# Patient Record
Sex: Female | Born: 1995 | Race: Black or African American | Hispanic: No | Marital: Single | Smoking: Never smoker
Health system: Southern US, Community
[De-identification: ages and names within clinical notes are randomized; demographics above are authoritative.]

## PROBLEM LIST (undated history)

## (undated) ENCOUNTER — Inpatient Hospital Stay (HOSPITAL_COMMUNITY): Payer: Self-pay

## (undated) ENCOUNTER — Emergency Department (HOSPITAL_COMMUNITY): Admission: EM | Payer: Medicaid Other | Source: Home / Self Care

## (undated) DIAGNOSIS — O009 Unspecified ectopic pregnancy without intrauterine pregnancy: Secondary | ICD-10-CM

## (undated) DIAGNOSIS — R87629 Unspecified abnormal cytological findings in specimens from vagina: Secondary | ICD-10-CM

## (undated) DIAGNOSIS — S37009A Unspecified injury of unspecified kidney, initial encounter: Secondary | ICD-10-CM

## (undated) DIAGNOSIS — G43109 Migraine with aura, not intractable, without status migrainosus: Secondary | ICD-10-CM

## (undated) DIAGNOSIS — S36119A Unspecified injury of liver, initial encounter: Secondary | ICD-10-CM

## (undated) DIAGNOSIS — S36209A Unspecified injury of unspecified part of pancreas, initial encounter: Secondary | ICD-10-CM

## (undated) DIAGNOSIS — W3400XA Accidental discharge from unspecified firearms or gun, initial encounter: Secondary | ICD-10-CM

## (undated) DIAGNOSIS — A749 Chlamydial infection, unspecified: Secondary | ICD-10-CM

## (undated) DIAGNOSIS — F329 Major depressive disorder, single episode, unspecified: Secondary | ICD-10-CM

## (undated) DIAGNOSIS — F32A Depression, unspecified: Secondary | ICD-10-CM

## (undated) DIAGNOSIS — K651 Peritoneal abscess: Secondary | ICD-10-CM

## (undated) DIAGNOSIS — S3630XA Unspecified injury of stomach, initial encounter: Secondary | ICD-10-CM

## (undated) HISTORY — DX: Unspecified abnormal cytological findings in specimens from vagina: R87.629

## (undated) HISTORY — DX: Migraine with aura, not intractable, without status migrainosus: G43.109

---
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
## 2013-01-11 ENCOUNTER — Telehealth: Payer: Self-pay | Admitting: Obstetrics and Gynecology

## 2013-01-11 NOTE — Telephone Encounter (Signed)
Spoke with pt. Period started today. Blood is "orange" instead of red. +cramping. Appt scheduled to be seen. JSY

## 2013-01-16 ENCOUNTER — Encounter: Payer: Self-pay | Admitting: *Deleted

## 2013-01-16 DIAGNOSIS — G43109 Migraine with aura, not intractable, without status migrainosus: Secondary | ICD-10-CM

## 2013-01-17 ENCOUNTER — Ambulatory Visit: Payer: Self-pay | Admitting: Adult Health

## 2013-01-23 ENCOUNTER — Ambulatory Visit: Payer: Self-pay | Admitting: Adult Health

## 2013-01-31 ENCOUNTER — Encounter: Payer: Self-pay | Admitting: Adult Health

## 2013-02-13 ENCOUNTER — Other Ambulatory Visit: Payer: Self-pay

## 2013-02-13 ENCOUNTER — Encounter: Payer: Self-pay | Admitting: Adult Health

## 2013-02-14 ENCOUNTER — Encounter: Payer: Self-pay | Admitting: Advanced Practice Midwife

## 2013-02-14 ENCOUNTER — Other Ambulatory Visit: Payer: Self-pay

## 2013-02-22 ENCOUNTER — Ambulatory Visit (INDEPENDENT_AMBULATORY_CARE_PROVIDER_SITE_OTHER): Payer: Medicaid Other | Admitting: Women's Health

## 2013-02-22 ENCOUNTER — Encounter: Payer: Self-pay | Admitting: Women's Health

## 2013-02-22 ENCOUNTER — Other Ambulatory Visit: Payer: Medicaid Other

## 2013-02-22 VITALS — BP 100/62 | Ht 66.0 in | Wt 140.5 lb

## 2013-02-22 DIAGNOSIS — Z3009 Encounter for other general counseling and advice on contraception: Secondary | ICD-10-CM

## 2013-02-22 DIAGNOSIS — A499 Bacterial infection, unspecified: Secondary | ICD-10-CM

## 2013-02-22 DIAGNOSIS — N898 Other specified noninflammatory disorders of vagina: Secondary | ICD-10-CM

## 2013-02-22 DIAGNOSIS — N760 Acute vaginitis: Secondary | ICD-10-CM

## 2013-02-22 DIAGNOSIS — Z3049 Encounter for surveillance of other contraceptives: Secondary | ICD-10-CM

## 2013-02-22 DIAGNOSIS — Z309 Encounter for contraceptive management, unspecified: Secondary | ICD-10-CM

## 2013-02-22 DIAGNOSIS — B9689 Other specified bacterial agents as the cause of diseases classified elsewhere: Secondary | ICD-10-CM

## 2013-02-22 DIAGNOSIS — Z3202 Encounter for pregnancy test, result negative: Secondary | ICD-10-CM

## 2013-02-22 LAB — POCT WET PREP (WET MOUNT)

## 2013-02-22 MED ORDER — METRONIDAZOLE 500 MG PO TABS: 500.0000 mg | ORAL_TABLET | Freq: Two times a day (BID) | ORAL | Status: DC

## 2013-02-22 MED ORDER — MEDROXYPROGESTERONE ACETATE 150 MG/ML IM SUSP
150.0000 mg | Freq: Once | INTRAMUSCULAR | Status: AC
  Administered 2013-02-22: 150 mg via INTRAMUSCULAR

## 2013-02-22 NOTE — Patient Instructions (Signed)
Bacterial Vaginosis Bacterial vaginosis (BV) is a vaginal infection where the normal balance of bacteria in the vagina is disrupted. The normal balance is then replaced by an overgrowth of certain bacteria. There are several different kinds of bacteria that can cause BV. BV is the most common vaginal infection in women of childbearing age. CAUSES   The cause of BV is not fully understood. BV develops when there is an increase or imbalance of harmful bacteria.  Some activities or behaviors can upset the normal balance of bacteria in the vagina and put women at increased risk including:  Having a new sex partner or multiple sex partners.  Douching.  Using an intrauterine device (IUD) for contraception.  It is not clear what role sexual activity plays in the development of BV. However, women that have never had sexual intercourse are rarely infected with BV. Women do not get BV from toilet seats, bedding, swimming pools or from touching objects around them.  SYMPTOMS   Grey vaginal discharge.  A fish-like odor with discharge, especially after sexual intercourse.  Itching or burning of the vagina and vulva.  Burning or pain with urination.  Some women have no signs or symptoms at all. DIAGNOSIS  Your caregiver must examine the vagina for signs of BV. Your caregiver will perform lab tests and look at the sample of vaginal fluid through a microscope. They will look for bacteria and abnormal cells (clue cells), a pH test higher than 4.5, and a positive amine test all associated with BV.  RISKS AND COMPLICATIONS   Pelvic inflammatory disease (PID).  Infections following gynecology surgery.  Developing HIV.  Developing herpes virus. TREATMENT  Sometimes BV will clear up without treatment. However, all women with symptoms of BV should be treated to avoid complications, especially if gynecology surgery is planned. Female partners generally do not need to be treated. However, BV may spread  between female sex partners so treatment is helpful in preventing a recurrence of BV.   BV may be treated with antibiotics. The antibiotics come in either pill or vaginal cream forms. Either can be used with nonpregnant or pregnant women, but the recommended dosages differ. These antibiotics are not harmful to the baby.  BV can recur after treatment. If this happens, a second round of antibiotics will often be prescribed.  Treatment is important for pregnant women. If not treated, BV can cause a premature delivery, especially for a pregnant woman who had a premature birth in the past. All pregnant women who have symptoms of BV should be checked and treated.  For chronic reoccurrence of BV, treatment with a type of prescribed gel vaginally twice a week is helpful. HOME CARE INSTRUCTIONS   Finish all medication as directed by your caregiver.  Do not have sex until treatment is completed.  Tell your sexual partner that you have a vaginal infection. They should see their caregiver and be treated if they have problems, such as a mild rash or itching.  Practice safe sex. Use condoms. Only have 1 sex partner. PREVENTION  Basic prevention steps can help reduce the risk of upsetting the natural balance of bacteria in the vagina and developing BV:  Do not have sexual intercourse (be abstinent).  Do not douche.  Use all of the medicine prescribed for treatment of BV, even if the signs and symptoms go away.  Tell your sex partner if you have BV. That way, they can be treated, if needed, to prevent reoccurrence. SEEK MEDICAL CARE IF:     Your symptoms are not improving after 3 days of treatment.  You have increased discharge, pain, or fever. MAKE SURE YOU:   Understand these instructions.  Will watch your condition.  Will get help right away if you are not doing well or get worse. FOR MORE INFORMATION  Division of STD Prevention (DSTDP), Centers for Disease Control and Prevention:  SolutionApps.co.za American Social Health Association (ASHA): www.ashastd.org  Document Released: 07/13/2005 Document Revised: 10/05/2011 Document Reviewed: 01/03/2009 [facility] Patient Information 2014 [place], [place].  Medroxyprogesterone injection [Contraceptive] What is this medicine? MEDROXYPROGESTERONE (me DROX ee proe JES te rone) contraceptive injections prevent pregnancy. They provide effective birth control for 3 months. Depo-subQ Provera 104 is also used for treating pain related to endometriosis. This medicine may be used for other purposes; ask your health care provider or pharmacist if you have questions. What should I tell my health care provider before I take this medicine? They need to know if you have any of these conditions: -frequently drink alcohol -asthma -blood vessel disease or a history of a blood clot in the lungs or legs -bone disease such as osteoporosis -breast cancer -diabetes -eating disorder (anorexia nervosa or bulimia) -high blood pressure -HIV infection or AIDS -kidney disease -liver disease -mental depression -migraine -seizures (convulsions) -stroke -tobacco smoker -vaginal bleeding -an unusual or allergic reaction to medroxyprogesterone, other hormones, medicines, foods, dyes, or preservatives -pregnant or trying to get pregnant -breast-feeding How should I use this medicine? Depo-Provera Contraceptive injection is given into a muscle. Depo-subQ Provera 104 injection is given under the skin. These injections are given by a health care professional. You must not be pregnant before getting an injection. The injection is usually given during the first 5 days after the start of a menstrual period or 6 weeks after delivery of a baby. Talk to your pediatrician regarding the use of this medicine in children. Special care may be needed. These injections have been used in female children who have started having menstrual periods. Overdosage: If you think you  have taken too much of this medicine contact a poison control center or emergency room at once. NOTE: This medicine is only for you. Do not share this medicine with others. What if I miss a dose? Try not to miss a dose. You must get an injection once every 3 months to maintain birth control. If you cannot keep an appointment, call and reschedule it. If you wait longer than 13 weeks between Depo-Provera contraceptive injections or longer than 14 weeks between Depo-subQ Provera 104 injections, you could get pregnant. Use another method for birth control if you miss your appointment. You may also need a pregnancy test before receiving another injection. What may interact with this medicine? Do not take this medicine with any of the following medications: -bosentan This medicine may also interact with the following medications: -aminoglutethimide -antibiotics or medicines for infections, especially rifampin, rifabutin, rifapentine, and griseofulvin -aprepitant -barbiturate medicines such as phenobarbital or primidone -bexarotene -carbamazepine -medicines for seizures like ethotoin, felbamate, oxcarbazepine, phenytoin, topiramate -modafinil -St. John's wort This list may not describe all possible interactions. Give your health care provider a list of all the medicines, herbs, non-prescription drugs, or dietary supplements you use. Also tell them if you smoke, drink alcohol, or use illegal drugs. Some items may interact with your medicine. What should I watch for while using this medicine? This drug does not protect you against HIV infection (AIDS) or other sexually transmitted diseases. Use of this product may cause you to lose  calcium from your bones. Loss of calcium may cause weak bones (osteoporosis). Only use this product for more than 2 years if other forms of birth control are not right for you. The longer you use this product for birth control the more likely you will be at risk for weak bones.  Ask your health care professional how you can keep strong bones. You may have a change in bleeding pattern or irregular periods. Many females stop having periods while taking this drug. If you have received your injections on time, your chance of being pregnant is very low. If you think you may be pregnant, see your health care professional as soon as possible. Tell your health care professional if you want to get pregnant within the next year. The effect of this medicine may last a long time after you get your last injection. What side effects may I notice from receiving this medicine? Side effects that you should report to your doctor or health care professional as soon as possible: -allergic reactions like skin rash, itching or hives, swelling of the face, lips, or tongue -breast tenderness or discharge -breathing problems -changes in vision -depression -feeling faint or lightheaded, falls -fever -pain in the abdomen, chest, groin, or leg -problems with balance, talking, walking -unusually weak or tired -yellowing of the eyes or skin Side effects that usually do not require medical attention (report to your doctor or health care professional if they continue or are bothersome): -acne -fluid retention and swelling -headache -irregular periods, spotting, or absent periods -temporary pain, itching, or skin reaction at site where injected -weight gain This list may not describe all possible side effects. Call your doctor for medical advice about side effects. You may report side effects to FDA at 1-800-FDA-1088. Where should I keep my medicine? This does not apply. The injection will be given to you by a health care professional. NOTE: This sheet is a summary. It may not cover all possible information. If you have questions about this medicine, talk to your doctor, pharmacist, or health care provider.  2013, Elsevier/Gold Standard. (08/03/2008 6:37:56 PM)

## 2013-02-22 NOTE — Progress Notes (Signed)
Ruth Dunlap is a 17 y.o. G58P1001 female who is here today for nexplanon insertion. She had a nexplanon placed in Nov 2013, and had it removed in Feb 2014 b/c she thought it was causing her headaches, but she has continued to have headaches app 2x/wk since removal.  Headaches are unilateral, w/o aura, n/v, photo or phonosensitivity, and r/b tylenol.  Denies h/o HTN or DVTs.  Discussed w/ JVF, states ok to re-place nexplanon. Hasn't had sex in ~ 1 month.  Reports malodorous d/c x few weeks.   Risks/benefits/side effects of Nexplanon have been discussed and her questions have been answered.  Specifically, a failure rate of 07/998 has been reported, with an increased failure rate if pt takes St. John's Wort and/or antiseizure medicaitons.  Alondra L Neira is aware of the common side effect of irregular bleeding, which the incidence of decreases over time.  O: BP 100/62  Ht 5\' 6"  (1.676 m)  Wt 140 lb 8 oz (63.73 kg)  BMI 22.69 kg/m2  LMP 02/07/2013  Breastfeeding? No        Results for orders placed in visit on 02/22/13 (from the past 24 hour(s))  HCG, QUANTITATIVE, PREGNANCY     Status: None   Collection Time    02/22/13  9:30 AM      Result Value Range   hCG, Beta Chain, Quant, S <1.0     Narrative:    Performed at:  [facility]                [address] [place], [place] [address]   Pt left-handed. Her right arm, approximatly 4 inches proximal from the elbow, was cleansed with alcohol and anesthetized with 2cc of 2% Lidocaine.  Immediately after, she asked if this was the one that could cause weight gain, and I explained that weight gain is not typical w/ nexplanon, but can be a side effect of depo provera. Pt states she has changed her mind, doesn't want nexplanon, but wants depo provera instead. Discussed r/b depo provera.   Spec exam: mod amount yellowish malodorous d/c, wet prep+clues  A: Contraception Counseling     BV  P: Rx for depo provera, to bring  back for injection today     Rx metronidazole 500mg  bid x 7d     F/U q 3months for depo injection  Marge Duncans 02/22/2013 5:04 PM

## 2013-05-17 ENCOUNTER — Ambulatory Visit: Payer: Medicaid Other

## 2013-05-22 ENCOUNTER — Other Ambulatory Visit: Payer: Medicaid Other

## 2013-05-22 ENCOUNTER — Ambulatory Visit (INDEPENDENT_AMBULATORY_CARE_PROVIDER_SITE_OTHER): Payer: Medicaid Other | Admitting: Adult Health

## 2013-05-22 ENCOUNTER — Encounter: Payer: Self-pay | Admitting: Adult Health

## 2013-05-22 ENCOUNTER — Encounter (INDEPENDENT_AMBULATORY_CARE_PROVIDER_SITE_OTHER): Payer: Self-pay

## 2013-05-22 VITALS — BP 120/86 | Ht 66.0 in | Wt 141.0 lb

## 2013-05-22 DIAGNOSIS — Z3200 Encounter for pregnancy test, result unknown: Secondary | ICD-10-CM

## 2013-05-22 DIAGNOSIS — Z309 Encounter for contraceptive management, unspecified: Secondary | ICD-10-CM

## 2013-05-22 DIAGNOSIS — Z3202 Encounter for pregnancy test, result negative: Secondary | ICD-10-CM

## 2013-05-22 DIAGNOSIS — Z3049 Encounter for surveillance of other contraceptives: Secondary | ICD-10-CM

## 2013-05-22 LAB — POCT URINE PREGNANCY: Preg Test, Ur: NEGATIVE

## 2013-05-22 MED ORDER — MEDROXYPROGESTERONE ACETATE 150 MG/ML IM SUSP
150.0000 mg | Freq: Once | INTRAMUSCULAR | Status: AC
  Administered 2013-05-22: 150 mg via INTRAMUSCULAR

## 2013-07-27 NOTE — L&D Delivery Note (Signed)
Delivery Note At 4:10 PM a viable and healthy female was delivered via Vaginal, Spontaneous Delivery (Presentation: Left Occiput Anterior).  APGAR: 7, 9; weight 6 lb 2.8 oz (2800 g).   Placenta status: Intact, Spontaneous.  Cord: 3 vessels with the following complications: None.   Anesthesia: Epidural  Episiotomy: None Lacerations: None Suture Repair: N/A Est. Blood Loss (mL): 250  Mom to postpartum.  Baby to Nursery.  Donalda Ewings L 07/10/2014, 5:25 PM

## 2013-08-07 ENCOUNTER — Ambulatory Visit (INDEPENDENT_AMBULATORY_CARE_PROVIDER_SITE_OTHER): Payer: Medicaid Other | Admitting: Obstetrics & Gynecology

## 2013-08-07 ENCOUNTER — Encounter: Payer: Self-pay | Admitting: Obstetrics & Gynecology

## 2013-08-07 VITALS — BP 120/80 | Ht 65.0 in | Wt 143.0 lb

## 2013-08-07 DIAGNOSIS — Z3049 Encounter for surveillance of other contraceptives: Secondary | ICD-10-CM

## 2013-08-07 DIAGNOSIS — Z01419 Encounter for gynecological examination (general) (routine) without abnormal findings: Secondary | ICD-10-CM

## 2013-08-07 MED ORDER — OMEPRAZOLE 20 MG PO CPDR: 20.0000 mg | DELAYED_RELEASE_CAPSULE | Freq: Every day | ORAL | Status: DC

## 2013-08-07 NOTE — Progress Notes (Signed)
Patient ID: Ruth Dunlap, female   DOB: 28-Jan-1996, 18 y.o.   MRN: [id number] Blood pressure 120/80, height 5\' 5"  (1.651 m), weight 143 lb (64.864 kg).  Subjective:     Ruth Dunlap is a 18 y.o. female here for a routine exam.  No LMP recorded. Patient has had an injection. G1P1001 Birth Control Method:  depo Menstrual Calendar(currently): amenorrheic on depo  Current complaints:  emesis.   Current acute medical issues:  none   Recent Gynecologic History No LMP recorded. Patient has had an injection. Last Pap: na,   Last mammogram: ,    Past Medical History  Diagnosis Date  . Migraine with aura     History reviewed. No pertinent past surgical history.  OB History   Grav Para Term Preterm Abortions TAB SAB Ect Mult Living   1 1 1  0 0 0 0 0 0 1      History   Social History  . Marital Status: Single    Spouse Name: N/A    Number of Children: N/A  . Years of Education: N/A   Social History Main Topics  . Smoking status: Never Smoker   . Smokeless tobacco: Never Used  . Alcohol Use: No  . Drug Use: No  . Sexual Activity: Yes    Birth Control/ Protection: None   Other Topics Concern  . None   Social History Narrative  . None    Family History  Problem Relation Age of Onset  . Diabetes Maternal Grandmother   . Hypertension Maternal Grandmother      Review of Systems  Review of Systems  Constitutional: Negative for fever, chills, weight loss, malaise/fatigue and diaphoresis.  HENT: Negative for hearing loss, ear pain, nosebleeds, congestion, sore throat, neck pain, tinnitus and ear discharge.   Eyes: Negative for blurred vision, double vision, photophobia, pain, discharge and redness.  Respiratory: Negative for cough, hemoptysis, sputum production, shortness of breath, wheezing and stridor.   Cardiovascular: Negative for chest pain, palpitations, orthopnea, claudication, leg swelling and PND.  Gastrointestinal: negative for abdominal pain. Negative for  heartburn, nausea, vomiting, diarrhea, constipation, blood in stool and melena.  Genitourinary: Negative for dysuria, urgency, frequency, hematuria and flank pain.  Musculoskeletal: Negative for myalgias, back pain, joint pain and falls.  Skin: Negative for itching and rash.  Neurological: Negative for dizziness, tingling, tremors, sensory change, speech change, focal weakness, seizures, loss of consciousness, weakness and headaches.  Endo/Heme/Allergies: Negative for environmental allergies and polydipsia. Does not bruise/bleed easily.  Psychiatric/Behavioral: Negative for depression, suicidal ideas, hallucinations, memory loss and substance abuse. The patient is not nervous/anxious and does not have insomnia.        Objective:    Physical Exam  Vitals reviewed. Constitutional: She is oriented to person, place, and time. She appears well-developed and well-nourished.  HENT:  Head: Normocephalic and atraumatic.        Right Ear: External ear normal.  Left Ear: External ear normal.  Nose: Nose normal.  Mouth/Throat: Oropharynx is clear and moist.  Eyes: Conjunctivae and EOM are normal. Pupils are equal, round, and reactive to light. Right eye exhibits no discharge. Left eye exhibits no discharge. No scleral icterus.  Neck: Normal range of motion. Neck supple. No tracheal deviation present. No thyromegaly present.  Cardiovascular: Normal rate, regular rhythm, normal heart sounds and intact distal pulses.  Exam reveals no gallop and no friction rub.   No murmur heard. Respiratory: Effort normal and breath sounds normal. No respiratory distress. She  has no wheezes. She has no rales. She exhibits no tenderness.  GI: Soft. Bowel sounds are normal. She exhibits no distension and no mass. There is no tenderness. There is no rebound and no guarding.  Genitourinary:  Breasts no masses skin changes or nipple changes bilaterally      Vulva is normal without lesions Vagina is pink moist without  discharge Cervix normal in appearance and pap is not done Uterus is normal size shape and contour Adnexa is negative with normal sized ovaries   Musculoskeletal: Normal range of motion. She exhibits no edema and no tenderness.  Neurological: She is alert and oriented to person, place, and time. She has normal reflexes. She displays normal reflexes. No cranial nerve deficit. She exhibits normal muscle tone. Coordination normal.  Skin: Skin is warm and dry. No rash noted. No erythema. No pallor.  Psychiatric: She has a normal mood and affect. Her behavior is normal. Judgment and thought content normal.       Assessment:    Healthy female exam.    Plan:    Contraception: Depo-Provera injections. Follow up in: 1 year.for yearly Gt depo as scheduled

## 2013-08-14 ENCOUNTER — Encounter: Payer: Self-pay | Admitting: *Deleted

## 2013-08-14 ENCOUNTER — Ambulatory Visit: Payer: Medicaid Other

## 2013-09-05 ENCOUNTER — Other Ambulatory Visit: Payer: Medicaid Other

## 2013-09-05 ENCOUNTER — Encounter: Payer: Self-pay | Admitting: *Deleted

## 2013-09-05 ENCOUNTER — Ambulatory Visit: Payer: Medicaid Other

## 2013-11-21 ENCOUNTER — Encounter (HOSPITAL_COMMUNITY): Payer: Self-pay | Admitting: Emergency Medicine

## 2013-11-21 ENCOUNTER — Emergency Department (HOSPITAL_COMMUNITY)
Admission: EM | Admit: 2013-11-21 | Discharge: 2013-11-21 | Disposition: A | Discharge status: Home / Self Care | Payer: Medicaid Other | Attending: Emergency Medicine | Admitting: Emergency Medicine

## 2013-11-21 DIAGNOSIS — Z8679 Personal history of other diseases of the circulatory system: Secondary | ICD-10-CM | POA: Insufficient documentation

## 2013-11-21 DIAGNOSIS — J020 Streptococcal pharyngitis: Secondary | ICD-10-CM | POA: Insufficient documentation

## 2013-11-21 DIAGNOSIS — IMO0001 Reserved for inherently not codable concepts without codable children: Secondary | ICD-10-CM | POA: Insufficient documentation

## 2013-11-21 LAB — RAPID STREP SCREEN (MED CTR MEBANE ONLY): Streptococcus, Group A Screen (Direct): POSITIVE — AB

## 2013-11-21 MED ORDER — PENICILLIN G BENZATHINE 1200000 UNIT/2ML IM SUSP
1.2000 10*6.[IU] | Freq: Once | INTRAMUSCULAR | Status: AC
Start: 2013-11-21 — End: 2013-11-21
  Administered 2013-11-21: 1.2 10*6.[IU] via INTRAMUSCULAR
  Filled 2013-11-21: qty 2

## 2013-11-21 MED ORDER — IBUPROFEN 800 MG PO TABS
800.0000 mg | ORAL_TABLET | Freq: Once | ORAL | Status: AC
  Administered 2013-11-21: 800 mg via ORAL
  Filled 2013-11-21: qty 1

## 2013-11-21 NOTE — ED Notes (Signed)
Sore throat, fever, headache, body aches

## 2013-11-21 NOTE — ED Provider Notes (Signed)
CSN: [id number]     Arrival date & time 11/21/13  1811 History   First MD Initiated Contact with Patient 11/21/13 1841     Chief Complaint  Patient presents with  . Sore Throat     (Consider location/radiation/quality/duration/timing/severity/associated sxs/prior Treatment) Patient is a 18 y.o. female presenting with pharyngitis. The history is provided by the patient.  Sore Throat This is a new problem. The current episode started yesterday. The problem occurs constantly. The problem has been gradually worsening. Associated symptoms include chills, a fever, headaches, myalgias and a sore throat. Pertinent negatives include no abdominal pain, chest pain, congestion, coughing or neck pain. The symptoms are aggravated by swallowing. Treatments tried: benadryl. The treatment provided no relief.    Past Medical History  Diagnosis Date  . Migraine with aura    History reviewed. No pertinent past surgical history. Family History  Problem Relation Age of Onset  . Diabetes Maternal Grandmother   . Hypertension Maternal Grandmother    History  Substance Use Topics  . Smoking status: Never Smoker   . Smokeless tobacco: Never Used  . Alcohol Use: No   OB History   Grav Para Term Preterm Abortions TAB SAB Ect Mult Living   1 1 1  0 0 0 0 0 0 1     Review of Systems  Constitutional: Positive for fever and chills.  HENT: Positive for sore throat and trouble swallowing. Negative for congestion, ear pain, rhinorrhea, sinus pressure and voice change.   Eyes: Negative for discharge.  Respiratory: Negative for cough, shortness of breath, wheezing and stridor.   Cardiovascular: Negative for chest pain.  Gastrointestinal: Negative for abdominal pain.  Genitourinary: Negative.   Musculoskeletal: Positive for myalgias. Negative for neck pain and neck stiffness.  Neurological: Positive for headaches.      Allergies  Review of patient's allergies indicates no known allergies.  Home  Medications   Prior to Admission medications   Medication Sig Start Date End Date Taking? Authorizing Provider  diphenhydrAMINE (BENADRYL) 25 MG tablet Take 25 mg by mouth daily as needed for allergies.    Yes Historical Provider, MD   BP 105/67  Pulse 121  Temp(Src) 100.4 F (38 C) (Oral)  Resp 18  Ht 5\' 6"  (1.676 m)  Wt 143 lb (64.864 kg)  BMI 23.09 kg/m2  SpO2 100%  LMP 10/25/2013  Breastfeeding? No Physical Exam  Constitutional: She is oriented to person, place, and time. She appears well-developed and well-nourished.  HENT:  Head: Normocephalic and atraumatic.  Right Ear: Tympanic membrane and ear canal normal.  Left Ear: Tympanic membrane and ear canal normal.  Nose: No mucosal edema or rhinorrhea.  Mouth/Throat: Uvula is midline and mucous membranes are normal. Oropharyngeal exudate and posterior oropharyngeal erythema present. No posterior oropharyngeal edema or tonsillar abscesses.  Eyes: Conjunctivae are normal.  Cardiovascular: Normal rate and normal heart sounds.   Pulmonary/Chest: Effort normal. No respiratory distress. She has no wheezes. She has no rales.  Abdominal: Soft. There is no tenderness.  Musculoskeletal: Normal range of motion.  Lymphadenopathy:       Head (right side): Tonsillar adenopathy present. No occipital adenopathy present.       Head (left side): Tonsillar adenopathy present. No occipital adenopathy present.  Neurological: She is alert and oriented to person, place, and time.  Skin: Skin is warm and dry. No rash noted.  Psychiatric: She has a normal mood and affect.    ED Course  Procedures (including critical care time) Labs Review  Labs Reviewed  RAPID STREP SCREEN - Abnormal; Notable for the following:    Streptococcus, Group A Screen (Direct) POSITIVE (*)    All other components within normal limits    Imaging Review No results found.   EKG Interpretation None      MDM   Final diagnoses:  Strep pharyngitis    The  patient was given ibuprofen with improvement in throat pain.  Labs were reviewed and she is strep positive.  Given a choice between Bicillin and tablets she chose Bicillin LA.  Encouraged rest, increase fluid intake, continued ibuprofen for throat pain relief and followed by her PCP if not improving over the next several days.    Evalee Jefferson, PA-C 11/21/13 2034

## 2013-11-21 NOTE — Discharge Instructions (Signed)
Strep Throat  Strep throat is an infection of the throat caused by a bacteria named Streptococcus pyogenes. Your caregiver may call the infection streptococcal "tonsillitis" or "pharyngitis" depending on whether there are signs of inflammation in the tonsils or back of the throat. Strep throat is most common in children aged 20 15 years during the cold months of the year, but it can occur in people of any age during any season. This infection is spread from person to person (contagious) through coughing, sneezing, or other close contact.  SYMPTOMS   · Fever or chills.  · Painful, swollen, red tonsils or throat.  · Pain or difficulty when swallowing.  · White or yellow spots on the tonsils or throat.  · Swollen, tender lymph nodes or "glands" of the neck or under the jaw.  · Red rash all over the body (rare).  DIAGNOSIS   Many different infections can cause the same symptoms. A test must be done to confirm the diagnosis so the right treatment can be given. A "rapid strep test" can help your caregiver make the diagnosis in a few minutes. If this test is not available, a light swab of the infected area can be used for a throat culture test. If a throat culture test is done, results are usually available in a day or two.  TREATMENT   Strep throat is treated with antibiotic medicine.  HOME CARE INSTRUCTIONS   · Gargle with 1 tsp of salt in 1 cup of warm water, 3 4 times per day or as needed for comfort.  · Family members who also have a sore throat or fever should be tested for strep throat and treated with antibiotics if they have the strep infection.  · Make sure everyone in your household washes their hands well.  · Do not share food, drinking cups, or personal items that could cause the infection to spread to others.  · You may need to eat a soft food diet until your sore throat gets better.  · Drink enough water and fluids to keep your urine clear or pale yellow. This will help prevent dehydration.  · Get plenty of  rest.  · Stay home from school, daycare, or work until you have been on antibiotics for 24 hours.  · Only take over-the-counter or prescription medicines for pain, discomfort, or fever as directed by your caregiver.  · If antibiotics are prescribed, take them as directed. Finish them even if you start to feel better.  SEEK MEDICAL CARE IF:   · The glands in your neck continue to enlarge.  · You develop a rash, cough, or earache.  · You cough up green, yellow-brown, or bloody sputum.  · You have pain or discomfort not controlled by medicines.  · Your problems seem to be getting worse rather than better.  SEEK IMMEDIATE MEDICAL CARE IF:   · You develop any new symptoms such as vomiting, severe headache, stiff or painful neck, chest pain, shortness of breath, or trouble swallowing.  · You develop severe throat pain, drooling, or changes in your voice.  · You develop swelling of the neck, or the skin on the neck becomes red and tender.  · You have a fever.  · You develop signs of dehydration, such as fatigue, dry mouth, and decreased urination.  · You become increasingly sleepy, or you cannot wake up completely.  Document Released: 07/10/2000 Document Revised: 06/29/2012 Document Reviewed: 09/11/2010  [employer]® Patient Information ©2014 [employer].

## 2013-11-21 NOTE — ED Provider Notes (Signed)
Medical screening examination/treatment/procedure(s) were performed by non-physician practitioner and as supervising physician I was immediately available for consultation/collaboration.    Kathalene Frames, MD 11/21/13 [phone]

## 2013-12-19 ENCOUNTER — Encounter (HOSPITAL_COMMUNITY): Payer: Self-pay | Admitting: Emergency Medicine

## 2013-12-19 ENCOUNTER — Emergency Department (HOSPITAL_COMMUNITY): Payer: Medicaid Other

## 2013-12-19 ENCOUNTER — Emergency Department (HOSPITAL_COMMUNITY)
Admission: EM | Admit: 2013-12-19 | Discharge: 2013-12-20 | Disposition: A | Discharge status: Home / Self Care | Payer: Medicaid Other | Attending: Emergency Medicine | Admitting: Emergency Medicine

## 2013-12-19 DIAGNOSIS — R059 Cough, unspecified: Secondary | ICD-10-CM | POA: Insufficient documentation

## 2013-12-19 DIAGNOSIS — O2390 Unspecified genitourinary tract infection in pregnancy, unspecified trimester: Secondary | ICD-10-CM | POA: Insufficient documentation

## 2013-12-19 DIAGNOSIS — O9989 Other specified diseases and conditions complicating pregnancy, childbirth and the puerperium: Secondary | ICD-10-CM | POA: Insufficient documentation

## 2013-12-19 DIAGNOSIS — N760 Acute vaginitis: Secondary | ICD-10-CM | POA: Insufficient documentation

## 2013-12-19 DIAGNOSIS — R05 Cough: Secondary | ICD-10-CM | POA: Insufficient documentation

## 2013-12-19 DIAGNOSIS — M545 Low back pain, unspecified: Secondary | ICD-10-CM | POA: Insufficient documentation

## 2013-12-19 DIAGNOSIS — Z8679 Personal history of other diseases of the circulatory system: Secondary | ICD-10-CM | POA: Insufficient documentation

## 2013-12-19 DIAGNOSIS — Z3490 Encounter for supervision of normal pregnancy, unspecified, unspecified trimester: Secondary | ICD-10-CM

## 2013-12-19 LAB — BASIC METABOLIC PANEL WITH GFR
BUN: 8 mg/dL (ref 6–23)
CO2: 24 meq/L (ref 19–32)
Calcium: 9.6 mg/dL (ref 8.4–10.5)
Chloride: 98 meq/L (ref 96–112)
Creatinine, Ser: 0.52 mg/dL (ref 0.50–1.10)
GFR calc Af Amer: >90 mL/min
GFR calc non Af Amer: >90 mL/min
Glucose, Bld: 92 mg/dL (ref 70–99)
Potassium: 4 meq/L (ref 3.7–5.3)
Sodium: 135 meq/L — ABNORMAL LOW (ref 137–147)

## 2013-12-19 LAB — CBC WITH DIFFERENTIAL/PLATELET
Basophils Absolute: 0 10*3/uL (ref 0.0–0.1)
Basophils Relative: 0 % (ref 0–1)
Eosinophils Absolute: 0.1 10*3/uL (ref 0.0–0.7)
Eosinophils Relative: 1 % (ref 0–5)
HCT: 36.4 % (ref 36.0–46.0)
Hemoglobin: 12.2 g/dL (ref 12.0–15.0)
Lymphocytes Relative: 30 % (ref 12–46)
Lymphs Abs: 2.7 10*3/uL (ref 0.7–4.0)
MCH: 26.5 pg (ref 26.0–34.0)
MCHC: 33.5 g/dL (ref 30.0–36.0)
MCV: 79 fL (ref 78.0–100.0)
Monocytes Absolute: 0.4 10*3/uL (ref 0.1–1.0)
Monocytes Relative: 5 % (ref 3–12)
Neutro Abs: 5.7 10*3/uL (ref 1.7–7.7)
Neutrophils Relative %: 64 % (ref 43–77)
Platelets: 299 10*3/uL (ref 150–400)
RBC: 4.61 MIL/uL (ref 3.87–5.11)
RDW: 12.7 % (ref 11.5–15.5)
WBC: 8.8 10*3/uL (ref 4.0–10.5)

## 2013-12-19 LAB — PREGNANCY, URINE: Preg Test, Ur: POSITIVE — AB

## 2013-12-19 LAB — URINE MICROSCOPIC-ADD ON

## 2013-12-19 LAB — URINALYSIS, ROUTINE W REFLEX MICROSCOPIC
Bilirubin Urine: NEGATIVE
Glucose, UA: NEGATIVE mg/dL
Hgb urine dipstick: NEGATIVE
Ketones, ur: NEGATIVE mg/dL
Nitrite: NEGATIVE
Specific Gravity, Urine: 1.015 (ref 1.005–1.030)
Urobilinogen, UA: 4 mg/dL — ABNORMAL HIGH (ref 0.0–1.0)
pH: 8 (ref 5.0–8.0)

## 2013-12-19 NOTE — ED Notes (Signed)
Pt c/o lower abdominal pain and vomiting x2-3 weeks. Pt denies diarrhea. Pt states pain began shortly after taking a positive pregnancy test.

## 2013-12-19 NOTE — ED Provider Notes (Signed)
CSN: [id number]     Arrival date & time 12/19/13  4270 History  This chart was scribed for Richarda Blade, MD by Rolanda Lundborg, ED Scribe. This patient was seen in room APA06/APA06 and the patient's care was started at 9:18 PM.    Chief Complaint  Patient presents with  . Abdominal Pain   The history is provided by the patient. No language interpreter was used.   HPI Comments: Ruth Dunlap is a 18 y.o. female who is 6-[redacted] weeks pregnant who presents to the Emergency Department complaining of generalized abdominal pain with associated vomiting since she found out she was pregnant 10 days ago. Her LMP was 4/3. She has not yet been seen by her OB GYN but states she has an appointment on June 2. She states she is unable to keep down fluids or solids.  She also reports mild back pain and a dry cough. She has a one year old child and this is her second pregnancy. No h/o surgeries. She denies vaginal bleeding, SOB, leg pain.    Past Medical History  Diagnosis Date  . Migraine with aura    History reviewed. No pertinent past surgical history. Family History  Problem Relation Age of Onset  . Diabetes Maternal Grandmother   . Hypertension Maternal Grandmother    History  Substance Use Topics  . Smoking status: Never Smoker   . Smokeless tobacco: Never Used  . Alcohol Use: No   OB History   Grav Para Term Preterm Abortions TAB SAB Ect Mult Living   1 1 1  0 0 0 0 0 0 1     Review of Systems  Respiratory: Positive for cough. Negative for shortness of breath.   Gastrointestinal: Positive for vomiting and abdominal pain. Negative for diarrhea.  Genitourinary: Negative for vaginal bleeding.  Musculoskeletal: Positive for back pain.  All other systems reviewed and are negative.     Allergies  Review of patient's allergies indicates no known allergies.  Home Medications   Prior to Admission medications   Not on File   BP 100/59  Pulse 81  Temp(Src) 98.7 F (37.1 C) (Oral)   Resp 18  Wt 143 lb (64.864 kg)  SpO2 100%  LMP 10/27/2013 Physical Exam  Nursing note and vitals reviewed. Constitutional: She is oriented to person, place, and time. She appears well-developed and well-nourished.  HENT:  Head: Normocephalic and atraumatic.  Eyes: Conjunctivae and EOM are normal. Pupils are equal, round, and reactive to light.  Neck: Normal range of motion and phonation normal. Neck supple.  Cardiovascular: Normal rate, regular rhythm and intact distal pulses.   Pulmonary/Chest: Effort normal and breath sounds normal. She exhibits no tenderness.  Abdominal: Soft. She exhibits no distension. There is no tenderness. There is no guarding.  Genitourinary:  No CVA tenderness. Normal external female genitalia. Thick, opaque discharge in vagina. Cervix closed. No cervical motion tenderness. Mild adnexal tenderness, bilaterally, with no palpable mass. Uterus is midline and appropriate size for dates.  Musculoskeletal: Normal range of motion. She exhibits tenderness (mild upper lumbar).  Neurological: She is alert and oriented to person, place, and time. She exhibits normal muscle tone.  Skin: Skin is warm and dry.  Psychiatric: She has a normal mood and affect. Her behavior is normal. Judgment and thought content normal.    ED Course  Procedures (including critical care time)  DIAGNOSTIC STUDIES: Oxygen Saturation is 100% on RA, normal by my interpretation.    COORDINATION OF CARE: Medications -  No data to display  Patient Vitals for the past 24 hrs:  BP Temp Temp src Pulse Resp SpO2 Weight  12/19/13 2331 112/59 mmHg 98.7 F (37.1 C) Oral 74 18 100 % -  12/19/13 1851 100/59 mmHg 98.7 F (37.1 C) Oral 81 18 100 % 143 lb (64.864 kg)   9:27 PM- Discussed treatment plan with pt which includes abdominal US, blood work, and UA. Pt agrees to plan.   1:1 AM Reevaluation with update and discussion. After initial assessment and treatment, an updated evaluation reveals is  comfortable, eating a sandwich and in no apparent distress.Richarda Blade    Labs Review Labs Reviewed  WET PREP, GENITAL - Abnormal; Notable for the following:    Clue Cells Wet Prep HPF POC MANY (*)    WBC, Wet Prep HPF POC FEW (*)    All other components within normal limits  URINALYSIS, ROUTINE W REFLEX MICROSCOPIC - Abnormal; Notable for the following:    APPearance HAZY (*)    Protein, ur TRACE (*)    Urobilinogen, UA 4.0 (*)    Leukocytes, UA MODERATE (*)    All other components within normal limits  PREGNANCY, URINE - Abnormal; Notable for the following:    Preg Test, Ur POSITIVE (*)    All other components within normal limits  BASIC METABOLIC PANEL - Abnormal; Notable for the following:    Sodium 135 (*)    All other components within normal limits  URINE MICROSCOPIC-ADD ON - Abnormal; Notable for the following:    Squamous Epithelial / LPF MANY (*)    Bacteria, UA FEW (*)    All other components within normal limits  GC/CHLAMYDIA PROBE AMP  CBC WITH DIFFERENTIAL  RPR  HIV ANTIBODY (ROUTINE TESTING)    Imaging Review US Ob Comp Less 14 Wks  12/20/2013   CLINICAL DATA:  Pain, possible ectopic pregnancy. Gestational age by last menstrual period 7 weeks and 4 days.  EXAM: TRANSVAGINAL OB ULTRASOUND; OBSTETRIC <14 WK ULTRASOUND  TECHNIQUE: Transvaginal ultrasound was performed for complete evaluation of the gestation as well as the maternal uterus, adnexal regions, and pelvic cul-de-sac.  COMPARISON:  None available for comparison at time of study interpretation.  FINDINGS: Intrauterine gestational sac: Visualized/normal in shape.  Yolk sac:  Present.  Embryo:  Present.  Cardiac Activity: Present.  Heart Rate: 175 bpm  CRL:   15.2  mm   7 w 6 d                  [place] EDC: August 01, 2014.  Maternal uterus/adnexae: 1.9 cm apparent corpus luteal cyst on the right, the left ovary was sonographically occult. Small chorionic hemorrhage noted.  IMPRESSION: Single live intrauterine  pregnancy, gestational age [redacted] weeks and 6 days by ultrasound (EDD August 01, 2014), with small subchorionic hemorrhage.  Right adnexal apparent corpus luteal cyst, left ovary was sonographically occult.   Electronically Signed   By: Elon Alas   On: 12/20/2013 00:12   US Ob Transvaginal  12/20/2013   CLINICAL DATA:  Pain, possible ectopic pregnancy. Gestational age by last menstrual period 7 weeks and 4 days.  EXAM: TRANSVAGINAL OB ULTRASOUND; OBSTETRIC <14 WK ULTRASOUND  TECHNIQUE: Transvaginal ultrasound was performed for complete evaluation of the gestation as well as the maternal uterus, adnexal regions, and pelvic cul-de-sac.  COMPARISON:  None available for comparison at time of study interpretation.  FINDINGS: Intrauterine gestational sac: Visualized/normal in shape.  Yolk sac:  Present.  Embryo:  Present.  Cardiac Activity: Present.  Heart Rate: 175 bpm  CRL:   15.2  mm   7 w 6 d                  [place] EDC: August 01, 2014.  Maternal uterus/adnexae: 1.9 cm apparent corpus luteal cyst on the right, the left ovary was sonographically occult. Small chorionic hemorrhage noted.  IMPRESSION: Single live intrauterine pregnancy, gestational age [redacted] weeks and 6 days by ultrasound (EDD August 01, 2014), with small subchorionic hemorrhage.  Right adnexal apparent corpus luteal cyst, left ovary was sonographically occult.   Electronically Signed   By: Elon Alas   On: 12/20/2013 00:12     EKG Interpretation None      MDM   Final diagnoses:  Pregnancy  Nonspecific vaginitis    Pregnancy with morning sickness. Doubt ectopic pregnancy. Incidental nonspecific vaginitis.   Nursing Notes Reviewed/ Care Coordinated Applicable Imaging Reviewed Interpretation of Laboratory Data incorporated into ED treatment  I personally performed the services described in this documentation, which was scribed in my presence. The recorded information has been reviewed and is accurate.     Richarda Blade,  MD 12/20/13 [phone]

## 2013-12-20 LAB — WET PREP, GENITAL
TRICH WET PREP: NONE SEEN
Yeast Wet Prep HPF POC: NONE SEEN

## 2013-12-20 LAB — RPR

## 2013-12-20 LAB — HIV ANTIBODY (ROUTINE TESTING W REFLEX): HIV 1&2 Ab, 4th Generation: NONREACTIVE

## 2013-12-20 MED ORDER — ONDANSETRON HCL 8 MG PO TABS: 8.0000 mg | ORAL_TABLET | Freq: Three times a day (TID) | ORAL | Status: DC | PRN

## 2013-12-20 MED ORDER — METRONIDAZOLE 500 MG PO TABS: 500.0000 mg | ORAL_TABLET | Freq: Two times a day (BID) | ORAL | Status: DC

## 2013-12-20 NOTE — Discharge Instructions (Signed)
Bacterial Vaginosis Bacterial vaginosis is an infection of the vagina. It happens when too many of certain germs (bacteria) grow in the vagina. HOME CARE  Take your medicine as told by your doctor.  Finish your medicine even if you start to feel better.  Do not have sex until you finish your medicine and are better.  Tell your sex partner that you have an infection. They should see their doctor for treatment.  Practice safe sex. Use condoms. Have only one sex partner. GET HELP IF:  You are not getting better after 3 days of treatment.  You have more grey fluid (discharge) coming from your vagina than before.  You have more pain than before.  You have a fever. MAKE SURE YOU:   Understand these instructions.  Will watch your condition.  Will get help right away if you are not doing well or get worse. Document Released: 04/21/2008 Document Revised: 05/03/2013 Document Reviewed: 02/22/2013 [facility] Patient Information 2014 [place].  Pregnancy If you are planning on getting pregnant, it is a good idea to make a preconception appointment with your caregiver to discuss having a healthy lifestyle before getting pregnant. This includes diet, weight, exercise, taking prenatal vitamins (especially folic acid, which helps prevent brain and spinal cord defects), avoiding alcohol, smoking and illegal drugs, medical problems (diabetes, convulsions), family history of genetic problems, working conditions, and immunizations. It is better to have knowledge of these things and do something about them before getting pregnant. During your pregnancy, it is important to follow certain guidelines in order to have a healthy baby. It is very important to get good prenatal care and follow your caregiver's instructions. Prenatal care includes all the medical care you receive before your baby's birth. This helps to prevent problems during the pregnancy and childbirth. HOME CARE INSTRUCTIONS   Start  your prenatal visits by the 12th week of pregnancy or earlier, if possible. At first, appointments are usually scheduled monthly. They become more frequent in the last 2 months before delivery. It is important that you keep your caregiver's appointments and follow your caregiver's instructions regarding medication use, exercise, and diet.  During pregnancy, you are providing food for you and your baby. Eat a regular, well-balanced diet. Choose foods such as meat, fish, milk and other dairy products, vegetables, fruits, whole-grain breads and cereals. Your caregiver will inform you of the ideal weight gain depending on your current height and weight. Drink lots of liquids. Try to drink 8 glasses of water a day.  Alcohol is associated with a number of birth defects including fetal alcohol syndrome. It is best to avoid alcohol completely. Smoking will cause low birth rate and prematurity. Use of alcohol and nicotine during your pregnancy also increases the chances that your child will be chemically dependent later in their life and may contribute to SIDS (Sudden Infant Death Syndrome).  Do not use illegal drugs.  Only take prescription or over-the-counter medications that are recommended by your caregiver. Other medications can cause genetic and physical problems in the baby.  Morning sickness can often be helped by keeping soda crackers at the bedside. Eat a few before getting up in the morning.  A sexual relationship may be continued until near the end of pregnancy if there are no other problems such as early (premature) leaking of amniotic fluid from the membranes, vaginal bleeding, painful intercourse or belly (abdominal) pain.  Exercise regularly. Check with your caregiver if you are unsure of the safety of some of your  exercises.  Do not use hot tubs, steam rooms or saunas. These increase the risk of fainting and hurting yourself and the baby. Swimming is OK for exercise. Get plenty of rest,  including afternoon naps when possible, especially in the third trimester.  Avoid toxic odors and chemicals.  Do not wear high heels. They may cause you to lose your balance and fall.  Do not lift over 5 pounds. If you do lift anything, lift with your legs and thighs, not your back.  Avoid long trips, especially in the third trimester.  If you have to travel out of the city or state, take a copy of your medical records with you. SEEK IMMEDIATE MEDICAL CARE IF:   You develop an unexplained oral temperature above 102 F (38.9 C), or as your caregiver suggests.  You have leaking of fluid from the vagina. If leaking membranes are suspected, take your temperature and inform your caregiver of this when you call.  There is vaginal spotting or bleeding. Notify your caregiver of the amount and how many pads are used.  You continue to feel sick to your stomach (nauseous) and have no relief from remedies suggested, or you throw up (vomit) blood or coffee ground like materials.  You develop upper abdominal pain.  You have round ligament discomfort in the lower abdominal area. This still must be evaluated by your caregiver.  You feel contractions of the uterus.  You do not feel the baby move, or there is less movement than before.  You have painful urination.  You have abnormal vaginal discharge.  You have persistent diarrhea.  You get a severe headache.  You have problems with your vision.  You develop muscle weakness.  You feel dizzy and faint.  You develop shortness of breath.  You develop chest pain.  You have back pain that travels down to your leg and feet.  You feel irregular or a very fast heartbeat.  You develop excessive weight gain in a short period of time (5 pounds in 3 to 5 days).  You are involved in a domestic violence situation. Document Released: 07/13/2005 Document Revised: 01/12/2012 Document Reviewed: 01/04/2009 [facility] Patient Information 2014  [place].

## 2013-12-21 LAB — GC/CHLAMYDIA PROBE AMP
CT Probe RNA: POSITIVE — AB
GC PROBE AMP APTIMA: NEGATIVE

## 2013-12-23 ENCOUNTER — Telehealth (HOSPITAL_BASED_OUTPATIENT_CLINIC_OR_DEPARTMENT_OTHER): Payer: Self-pay | Admitting: Emergency Medicine

## 2013-12-23 NOTE — Telephone Encounter (Signed)
+  Chlamydia. Chart sent to EDP office for review. DHHS attached. 

## 2013-12-30 NOTE — Telephone Encounter (Signed)
Unable to contact patient via phone. Sent letter. °

## 2014-01-02 ENCOUNTER — Other Ambulatory Visit: Payer: Medicaid Other

## 2014-01-03 ENCOUNTER — Encounter: Payer: Self-pay | Admitting: Adult Health

## 2014-01-03 ENCOUNTER — Ambulatory Visit (INDEPENDENT_AMBULATORY_CARE_PROVIDER_SITE_OTHER): Payer: Medicaid Other | Admitting: Adult Health

## 2014-01-03 VITALS — BP 118/62 | Wt 146.0 lb

## 2014-01-03 DIAGNOSIS — Z1389 Encounter for screening for other disorder: Secondary | ICD-10-CM

## 2014-01-03 DIAGNOSIS — Z3480 Encounter for supervision of other normal pregnancy, unspecified trimester: Secondary | ICD-10-CM

## 2014-01-03 DIAGNOSIS — Z3490 Encounter for supervision of normal pregnancy, unspecified, unspecified trimester: Secondary | ICD-10-CM | POA: Insufficient documentation

## 2014-01-03 DIAGNOSIS — Z331 Pregnant state, incidental: Secondary | ICD-10-CM

## 2014-01-03 LAB — POCT URINALYSIS DIPSTICK
Blood, UA: NEGATIVE
GLUCOSE UA: NEGATIVE
Ketones, UA: NEGATIVE
Nitrite, UA: NEGATIVE
Protein, UA: NEGATIVE

## 2014-01-03 MED ORDER — PROMETHAZINE HCL 25 MG PO TABS: 25.0000 mg | ORAL_TABLET | Freq: Four times a day (QID) | ORAL | Status: DC | PRN

## 2014-01-03 NOTE — Progress Notes (Signed)
Subjective:  Ruth Dunlap is a 18 y.o. G53P1001 African American female at [redacted]w[redacted]d by lMP being seen today for her first obstetrical visit.  Her obstetrical history is significant for had good first pregnancy.  Pregnancy history fully reviewed.  Patient reports nausea and vomiting, has zofran from ER, has not helped, will phenergan. Denies vb, cramping, uti s/s, abnormal/malodorous vag d/c, or vulvovaginal itching/irritation.  BP 118/62  Wt 146 lb (66.225 kg)  LMP 10/27/2013  HISTORY: OB History  Gravida Para Term Preterm AB SAB TAB Ectopic Multiple Living  2 1 1  0 0 0 0 0 0 1    # Outcome Date GA Lbr Len/2nd Weight Sex Delivery Anes PTL Lv  2 CUR           1 TRM 05/04/12 [redacted]w[redacted]d 03:07 / 02:42 6 lb 5.6 oz (2.88 kg) M SVD EPI  Y     Past Medical History  Diagnosis Date  . Migraine with aura   . Supervision of other normal pregnancy 01/03/2014   History reviewed. No pertinent past surgical history. Family History  Problem Relation Age of Onset  . Diabetes Maternal Grandmother   . Hypertension Maternal Grandmother     Exam   System:     General: Well developed & nourished, no acute distress   Skin: Warm & dry, normal coloration and turgor, no rashes   Neurologic: Alert & oriented, normal mood   Cardiovascular: Regular rate & rhythm   Respiratory: Effort & rate normal, LCTAB, acyanotic   Abdomen: Soft, non tender   Extremities: normal strength, tone                                                Thyroid normal size Pelvic Exam:    Perineum: deferred   Vulva: deferred   Vagina:  deferred   Cervix: deferred   Uterus: deferred   FHR:176 via doppler   Assessment:   Pregnancy: G2P1001 Patient Active Problem List   Diagnosis Date Noted  . Supervision of other normal pregnancy 01/03/2014  . Migraine with aura 01/16/2013  . Delivery normal 05/06/2012  . Active labor at term 05/06/2012    [redacted]w[redacted]d G2P1001 New OB visit     Plan:  Initial labs drawn Continue prenatal  vitamins Problem list reviewed and updated Reviewed n/v relief measures and warning s/s to report Reviewed recommended weight gain based on pre-gravid BMI Encouraged well-balanced diet Genetic Screening discussed Integrated Screen: requested Cystic fibrosis screening discussed requested Ultrasound discussed; fetal survey: requested Follow up in 3 weeks for IT/NT and see Manus Gunning Rx phenergan 25 mg #30 1 every 6 hours prn N/V with 1 refill [facility]NC filled put. Estill Dooms, NP 01/03/2014 2:18 PM

## 2014-01-03 NOTE — Patient Instructions (Signed)

## 2014-01-04 ENCOUNTER — Telehealth: Payer: Self-pay | Admitting: Adult Health

## 2014-01-04 ENCOUNTER — Encounter: Payer: Self-pay | Admitting: Adult Health

## 2014-01-04 ENCOUNTER — Telehealth: Payer: Self-pay | Admitting: *Deleted

## 2014-01-04 LAB — URINALYSIS, MICROSCOPIC ONLY
Bacteria, UA: NONE SEEN
CASTS: NONE SEEN
CRYSTALS: NONE SEEN

## 2014-01-04 LAB — ABO AND RH: Rh Type: POSITIVE

## 2014-01-04 LAB — RPR

## 2014-01-04 LAB — CBC
HCT: 30.4 % — ABNORMAL LOW (ref 36.0–46.0)
Hemoglobin: 10.5 g/dL — ABNORMAL LOW (ref 12.0–15.0)
MCH: 26.9 pg (ref 26.0–34.0)
MCHC: 34.5 g/dL (ref 30.0–36.0)
MCV: 77.7 fL — AB (ref 78.0–100.0)
Platelets: 266 10*3/uL (ref 150–400)
RBC: 3.91 MIL/uL (ref 3.87–5.11)
RDW: 14 % (ref 11.5–15.5)
WBC: 7.4 10*3/uL (ref 4.0–10.5)

## 2014-01-04 LAB — URINALYSIS, ROUTINE W REFLEX MICROSCOPIC
BILIRUBIN URINE: NEGATIVE
GLUCOSE, UA: NEGATIVE mg/dL
Hgb urine dipstick: NEGATIVE
Ketones, ur: NEGATIVE mg/dL
Nitrite: NEGATIVE
PH: 7 (ref 5.0–8.0)
PROTEIN: NEGATIVE mg/dL
Urobilinogen, UA: 1 mg/dL (ref 0.0–1.0)

## 2014-01-04 LAB — DRUG SCREEN, URINE, NO CONFIRMATION
Amphetamine Screen, Ur: NEGATIVE
BENZODIAZEPINES.: NEGATIVE
Barbiturate Quant, Ur: NEGATIVE
COCAINE METABOLITES: NEGATIVE
Creatinine,U: 98 mg/dL
MARIJUANA METABOLITE: NEGATIVE
METHADONE: NEGATIVE
Opiate Screen, Urine: NEGATIVE
PHENCYCLIDINE (PCP): NEGATIVE
Propoxyphene: NEGATIVE

## 2014-01-04 LAB — HIV ANTIBODY (ROUTINE TESTING W REFLEX): HIV 1&2 Ab, 4th Generation: NONREACTIVE

## 2014-01-04 LAB — GC/CHLAMYDIA PROBE AMP
CT Probe RNA: POSITIVE — AB
GC Probe RNA: NEGATIVE

## 2014-01-04 LAB — VARICELLA ZOSTER ANTIBODY, IGG: Varicella IgG: 587.7 Index — ABNORMAL HIGH (ref <135.00)

## 2014-01-04 LAB — CYSTIC FIBROSIS DIAGNOSTIC STUDY

## 2014-01-04 LAB — HEPATITIS B SURFACE ANTIGEN: HEP B S AG: NEGATIVE

## 2014-01-04 LAB — OXYCODONE SCREEN, UA, RFLX CONFIRM: OXYCODONE SCRN UR: NEGATIVE ng/mL

## 2014-01-04 LAB — RUBELLA SCREEN: Rubella: 1.13 Index — ABNORMAL HIGH (ref <0.90)

## 2014-01-04 LAB — TSH: TSH: 0.356 u[IU]/mL (ref 0.350–4.500)

## 2014-01-04 LAB — ANTIBODY SCREEN: ANTIBODY SCREEN: NEGATIVE

## 2014-01-04 LAB — SICKLE CELL SCREEN: Sickle Cell Screen: NEGATIVE

## 2014-01-04 MED ORDER — AZITHROMYCIN 250 MG PO TABS: ORAL_TABLET | ORAL | Status: DC

## 2014-01-04 NOTE — Telephone Encounter (Signed)
Pt informed on +Chlamydia, partners name is Davin Broadnax dob 10/17/1988.

## 2014-01-04 NOTE — Telephone Encounter (Signed)
Pt aware of +CHL per [place], will rx azithromycin for pt and partner Ruth Dunlap [birth year] to [facility], will send [facility]

## 2014-01-04 NOTE — Telephone Encounter (Signed)
Left message to call me back.

## 2014-01-05 ENCOUNTER — Encounter: Payer: Self-pay | Admitting: Adult Health

## 2014-01-05 LAB — URINE CULTURE
COLONY COUNT: NO GROWTH
Organism ID, Bacteria: NO GROWTH

## 2014-01-24 ENCOUNTER — Other Ambulatory Visit: Payer: Medicaid Other

## 2014-01-24 ENCOUNTER — Encounter: Payer: Medicaid Other | Admitting: Obstetrics & Gynecology

## 2014-02-01 ENCOUNTER — Encounter: Payer: Self-pay | Admitting: Obstetrics and Gynecology

## 2014-02-01 ENCOUNTER — Other Ambulatory Visit: Payer: Self-pay | Admitting: Adult Health

## 2014-02-01 ENCOUNTER — Ambulatory Visit (INDEPENDENT_AMBULATORY_CARE_PROVIDER_SITE_OTHER): Payer: Self-pay | Admitting: Obstetrics and Gynecology

## 2014-02-01 ENCOUNTER — Ambulatory Visit (INDEPENDENT_AMBULATORY_CARE_PROVIDER_SITE_OTHER): Payer: Medicaid Other

## 2014-02-01 VITALS — BP 110/70 | Wt 138.0 lb

## 2014-02-01 DIAGNOSIS — Z3480 Encounter for supervision of other normal pregnancy, unspecified trimester: Secondary | ICD-10-CM

## 2014-02-01 DIAGNOSIS — Z1389 Encounter for screening for other disorder: Secondary | ICD-10-CM

## 2014-02-01 DIAGNOSIS — Z331 Pregnant state, incidental: Secondary | ICD-10-CM

## 2014-02-01 DIAGNOSIS — Z3482 Encounter for supervision of other normal pregnancy, second trimester: Secondary | ICD-10-CM

## 2014-02-01 DIAGNOSIS — Z36 Encounter for antenatal screening of mother: Secondary | ICD-10-CM

## 2014-02-01 LAB — POCT URINALYSIS DIPSTICK
Blood, UA: NEGATIVE
GLUCOSE UA: NEGATIVE
Ketones, UA: NEGATIVE
Nitrite, UA: NEGATIVE

## 2014-02-01 NOTE — Progress Notes (Signed)
U/S(13+6wks)-active fetus,  fluid wnl, anterior Gr 0 placenta, cx appears closed, bilateral adnexa appears WNL, NB present, NT-1.71mm,CRL c/w dates, ?female fetus

## 2014-02-01 NOTE — Progress Notes (Signed)
Pt denies any problems or concerns at this time.  

## 2014-02-01 NOTE — Progress Notes (Signed)
G2P1001 [redacted]w[redacted]d Estimated Date of Delivery: 08/03/14  Blood pressure 110/70, weight 138 lb (62.596 kg), last menstrual period 10/27/2013, not currently breastfeeding.   BP weight and urine results all reviewed and noted.  Please refer to the obstetrical flow sheet for the fundal height and fetal heart rate documentation:  Patient reports good fetal movement, denies any bleeding and no rupture of membranes symptoms or regular contractions. Patient complains of intermittent nausea and vomiting.  All questions were answered.  Plan:  Continued routine obstetrical care,   Follow up in 4 weeks for OB appointment,

## 2014-02-07 LAB — MATERNAL SCREEN, INTEGRATED #1

## 2014-02-20 ENCOUNTER — Telehealth (HOSPITAL_BASED_OUTPATIENT_CLINIC_OR_DEPARTMENT_OTHER): Payer: Self-pay | Admitting: Emergency Medicine

## 2014-02-20 NOTE — Telephone Encounter (Signed)
No response to letter sent after 30 days. Chart sent to Medical Records. °

## 2014-03-01 ENCOUNTER — Ambulatory Visit (INDEPENDENT_AMBULATORY_CARE_PROVIDER_SITE_OTHER): Payer: Self-pay | Admitting: Advanced Practice Midwife

## 2014-03-01 ENCOUNTER — Encounter: Payer: Medicaid Other | Admitting: Advanced Practice Midwife

## 2014-03-01 VITALS — BP 110/54 | Wt 137.0 lb

## 2014-03-01 DIAGNOSIS — Z3480 Encounter for supervision of other normal pregnancy, unspecified trimester: Secondary | ICD-10-CM

## 2014-03-01 DIAGNOSIS — Z3482 Encounter for supervision of other normal pregnancy, second trimester: Secondary | ICD-10-CM

## 2014-03-01 DIAGNOSIS — Z36 Encounter for antenatal screening of mother: Secondary | ICD-10-CM

## 2014-03-01 DIAGNOSIS — Z1389 Encounter for screening for other disorder: Secondary | ICD-10-CM

## 2014-03-01 DIAGNOSIS — Z8619 Personal history of other infectious and parasitic diseases: Secondary | ICD-10-CM

## 2014-03-01 DIAGNOSIS — Z331 Pregnant state, incidental: Secondary | ICD-10-CM

## 2014-03-01 LAB — POCT URINALYSIS DIPSTICK
GLUCOSE UA: NEGATIVE
Ketones, UA: NEGATIVE
Leukocytes, UA: NEGATIVE
Nitrite, UA: NEGATIVE
Protein, UA: NEGATIVE
RBC UA: NEGATIVE

## 2014-03-01 LAB — OB RESULTS CONSOLE GC/CHLAMYDIA
Chlamydia: NEGATIVE
GC PROBE AMP, GENITAL: NEGATIVE

## 2014-03-01 NOTE — Progress Notes (Signed)
G2P1001 [redacted]w[redacted]d Estimated Date of Delivery: 08/03/14  Blood pressure 110/54, weight 137 lb (62.143 kg), last menstrual period 10/27/2013, not currently breastfeeding.   BP weight and urine results all reviewed and noted.  Please refer to the obstetrical flow sheet for the fundal height and fetal heart rate documentation: still has trouble keeping food down.  Patient reports good fetal movement, denies any bleeding and no rupture of membranes symptoms or regular contractions. Patient is without complaints. All questions were answered.  Plan:  Continued routine obstetrical care, POC CHL and 2nd IT today.  Try nutritional suupplement  Follow up in 2 weeks for OB appointment, anatomy scan

## 2014-03-02 LAB — GC/CHLAMYDIA PROBE AMP
CT Probe RNA: NEGATIVE
GC PROBE AMP APTIMA: NEGATIVE

## 2014-03-07 LAB — MATERNAL SCREEN, INTEGRATED #2
AFP MOM MAT SCREEN: 1.47
AFP, SERUM MAT SCREEN: 72.4 ng/mL
Age risk Down Syndrome: 1:1100 {titer}
Calculated Gestational Age: 17.7
Crown Rump Length: 81.6 mm
Estriol Mom: 1.13
Estriol, Free: 1.44 ng/mL
Inhibin A Dimeric: 100 pg/mL
Inhibin A MoM: 0.6
NT MOM MAT SCREEN: 0.93
NUCHAL TRANSLUCENCY MAT SCREEN 2: 1.66 mm
NUMBER OF FETUSES MAT SCREEN 2: 1
PAPP-A MAT SCREEN: 1495 ng/mL
PAPP-A MOM MAT SCREEN: 0.9
Rish for ONTD: 1:3400 {titer}
hCG MoM: 0.49
hCG, Serum: 15.5 IU/mL

## 2014-03-15 ENCOUNTER — Ambulatory Visit (INDEPENDENT_AMBULATORY_CARE_PROVIDER_SITE_OTHER): Payer: Self-pay | Admitting: Advanced Practice Midwife

## 2014-03-15 ENCOUNTER — Encounter: Payer: Self-pay | Admitting: Advanced Practice Midwife

## 2014-03-15 ENCOUNTER — Other Ambulatory Visit: Payer: Self-pay | Admitting: Advanced Practice Midwife

## 2014-03-15 ENCOUNTER — Ambulatory Visit (INDEPENDENT_AMBULATORY_CARE_PROVIDER_SITE_OTHER): Payer: Medicaid Other

## 2014-03-15 VITALS — BP 112/58 | Wt 138.0 lb

## 2014-03-15 DIAGNOSIS — Z3482 Encounter for supervision of other normal pregnancy, second trimester: Secondary | ICD-10-CM

## 2014-03-15 DIAGNOSIS — Z1389 Encounter for screening for other disorder: Secondary | ICD-10-CM

## 2014-03-15 DIAGNOSIS — Z3480 Encounter for supervision of other normal pregnancy, unspecified trimester: Secondary | ICD-10-CM

## 2014-03-15 DIAGNOSIS — Z331 Pregnant state, incidental: Secondary | ICD-10-CM

## 2014-03-15 LAB — POCT URINALYSIS DIPSTICK
Blood, UA: NEGATIVE
Glucose, UA: NEGATIVE
Ketones, UA: NEGATIVE
LEUKOCYTES UA: NEGATIVE
Nitrite, UA: NEGATIVE

## 2014-03-15 NOTE — Progress Notes (Signed)
G2P1001 [redacted]w[redacted]d Estimated Date of Delivery: 08/03/14  Blood pressure 112/58, weight 138 lb (62.596 kg), last menstrual period 10/27/2013, not currently breastfeeding.   BP weight and urine results all reviewed and noted.  Please refer to the obstetrical flow sheet for the fundal height and fetal heart rate documentation: Had anatomy scan today:  Normal female  Patient reports good fetal movement, denies any bleeding and no rupture of membranes symptoms or regular contractions. Patient is without complaints. All questions were answered.  Plan:  Continued routine obstetrical care,   Follow up in 4 weeks for OB appointment, Low-risk ob appt

## 2014-03-15 NOTE — Progress Notes (Signed)
U/S(19+6wks)-single active fetus, meas c/w dates, fluid wnl, anterior Gr 0 placenta, cx appears closed (3.2cm), bilateral adnexa appears WNL, FHR-157 bpm, no major abnl noted, female fetus

## 2014-03-15 NOTE — Progress Notes (Signed)
Pt denies any problems or concerns at this time.  

## 2014-04-12 ENCOUNTER — Encounter: Payer: Medicaid Other | Admitting: Advanced Practice Midwife

## 2014-04-17 ENCOUNTER — Ambulatory Visit (INDEPENDENT_AMBULATORY_CARE_PROVIDER_SITE_OTHER): Payer: Medicaid Other | Admitting: Advanced Practice Midwife

## 2014-04-17 VITALS — BP 114/64 | Wt 147.0 lb

## 2014-04-17 DIAGNOSIS — Z3480 Encounter for supervision of other normal pregnancy, unspecified trimester: Secondary | ICD-10-CM

## 2014-04-17 DIAGNOSIS — Z1389 Encounter for screening for other disorder: Secondary | ICD-10-CM

## 2014-04-17 DIAGNOSIS — Z331 Pregnant state, incidental: Secondary | ICD-10-CM

## 2014-04-17 LAB — POCT URINALYSIS DIPSTICK
Glucose, UA: NEGATIVE
KETONES UA: NEGATIVE
LEUKOCYTES UA: NEGATIVE
NITRITE UA: NEGATIVE
RBC UA: NEGATIVE

## 2014-04-17 NOTE — Patient Instructions (Signed)
1. Before your test, do not eat or drink anything for 8-10 hours prior to your  appointment (a small amount of water is allowed and you may take any medicines you normally take). Be sure to drink lots of water the day before. 2. When you arrive, your blood will be drawn for a 'fasting' blood sugar level.  Then you will be given a sweetened carbonated beverage to drink. You should  complete drinking this beverage within five minutes. After finishing the  beverage, you will have your blood drawn exactly 1 and 2 hours later. Having  your blood drawn on time is an important part of this test. A total of three blood  samples will be done. 3. The test takes approximately 2  hours. During the test, do not have anything to  eat or drink. Do not smoke, chew gum (not even sugarless gum) or use breath mints.  4. During the test you should remain close by and seated as much as possible and  avoid walking around. You may want to bring a book or something else to  occupy your time.  5. After your test, you may eat and drink as normal. You may want to bring a snack  to eat after the test is finished. Your provider will advise you as to the results of  this test and any follow-up if necessary  You will also be retested for syphilis, HIV and blood levels (anemia):  You were already tested in the first trimester, but [place] recommends retesting.  Additionally, you will be tested for Type 2 Herpes. MOST people do not know that they have genital herpes, as only around 15% of people have outbreaks.  However, it is still transmittable to other people, including the baby (but only during the birth).  If you test positive for Type 2 Herpes, we place you on a medicine called acyclovir the last 6 weeks of your pregnancy to prevent transmission of the virus to the baby during the birth.    If your sugar test is positive for gestational diabetes, you will be given an phone call and further instructions discussed.   We typically do not call patients with positive herpes results, but will discuss it at your next appointment.  If you wish to know all of your test results before your next appointment, feel free to call the office, or look up your test results on Mychart.  (The range that the lab uses for normal values of the sugar test are not necessarily the range that is used for pregnant women; if your results are within the range, they are definitely normal.  However, if a value is deemed "high" by the lab, it may not be too high for a pregnant woman.  We will need to discuss the normal range if your value(s) fall in the "high" category).     Sometime between 27 and 36 weeks, it is recommended that you and anyone who is going to be in close contact with your baby receive the Tdap booster.  You should receive it EACH pregnancy, regardless of when your last booster was.  You may go to the [facility] (no appointment necessary) or your Primary Care office to receive the vaccine.  If you do not receive the vaccine prior to delivery, it will be offered in the hospital.  However, if you get it at least 2 weeks prior to delivery, you will have the added advantage of passing the immunity to your baby.

## 2014-04-17 NOTE — Progress Notes (Signed)
G2P1001 [redacted]w[redacted]d Estimated Date of Delivery: 08/03/14  Blood pressure 114/64, weight 147 lb (66.679 kg), last menstrual period 10/27/2013, not currently breastfeeding.   BP weight and urine results all reviewed and noted.  Please refer to the obstetrical flow sheet for the fundal height and fetal heart rate documentation:  Patient reports good fetal movement, denies any bleeding and no rupture of membranes symptoms or regular contractions. Patient is without complaints other than normal pregnancy complaints All questions were answered.  Plan:  Continued routine obstetrical care,   Follow up in 3 weeks for OB appointment, PN2

## 2014-05-09 ENCOUNTER — Encounter: Payer: Medicaid Other | Admitting: Advanced Practice Midwife

## 2014-05-09 ENCOUNTER — Encounter: Payer: Self-pay | Admitting: *Deleted

## 2014-05-09 ENCOUNTER — Other Ambulatory Visit: Payer: Medicaid Other

## 2014-05-15 ENCOUNTER — Encounter: Payer: Medicaid Other | Admitting: Obstetrics & Gynecology

## 2014-05-15 ENCOUNTER — Other Ambulatory Visit: Payer: Medicaid Other

## 2014-05-22 ENCOUNTER — Encounter (HOSPITAL_COMMUNITY): Payer: Self-pay | Admitting: *Deleted

## 2014-05-22 ENCOUNTER — Inpatient Hospital Stay (HOSPITAL_COMMUNITY)
Admission: AD | Admit: 2014-05-22 | Discharge: 2014-05-22 | Disposition: A | Discharge status: Home / Self Care | Payer: Medicaid Other | Source: Ambulatory Visit | Attending: Family Medicine | Admitting: Family Medicine

## 2014-05-22 DIAGNOSIS — O26893 Other specified pregnancy related conditions, third trimester: Secondary | ICD-10-CM | POA: Diagnosis present

## 2014-05-22 DIAGNOSIS — Z3A29 29 weeks gestation of pregnancy: Secondary | ICD-10-CM | POA: Insufficient documentation

## 2014-05-22 DIAGNOSIS — Z3483 Encounter for supervision of other normal pregnancy, third trimester: Secondary | ICD-10-CM

## 2014-05-22 DIAGNOSIS — R109 Unspecified abdominal pain: Secondary | ICD-10-CM | POA: Insufficient documentation

## 2014-05-22 DIAGNOSIS — M545 Low back pain: Secondary | ICD-10-CM | POA: Insufficient documentation

## 2014-05-22 DIAGNOSIS — R51 Headache: Secondary | ICD-10-CM | POA: Insufficient documentation

## 2014-05-22 HISTORY — DX: Chlamydial infection, unspecified: A74.9

## 2014-05-22 LAB — URINALYSIS, ROUTINE W REFLEX MICROSCOPIC
Bilirubin Urine: NEGATIVE
Glucose, UA: NEGATIVE mg/dL
Hgb urine dipstick: NEGATIVE
Ketones, ur: NEGATIVE mg/dL
Nitrite: NEGATIVE
PH: 7.5 (ref 5.0–8.0)
PROTEIN: NEGATIVE mg/dL
Specific Gravity, Urine: 1.02 (ref 1.005–1.030)
Urobilinogen, UA: 1 mg/dL (ref 0.0–1.0)

## 2014-05-22 LAB — URINE MICROSCOPIC-ADD ON

## 2014-05-22 LAB — FETAL FIBRONECTIN: Fetal Fibronectin: NEGATIVE

## 2014-05-22 MED ORDER — CYCLOBENZAPRINE HCL 5 MG PO TABS
5.0000 mg | ORAL_TABLET | Freq: Once | ORAL | Status: AC
  Administered 2014-05-22: 5 mg via ORAL
  Filled 2014-05-22: qty 1

## 2014-05-22 MED ORDER — GI COCKTAIL ~~LOC~~
30.0000 mL | Freq: Once | ORAL | Status: AC
  Administered 2014-05-22: 30 mL via ORAL
  Filled 2014-05-22: qty 30

## 2014-05-22 MED ORDER — NIFEDIPINE 10 MG PO CAPS
10.0000 mg | ORAL_CAPSULE | Freq: Once | ORAL | Status: AC
  Administered 2014-05-22: 10 mg via ORAL
  Filled 2014-05-22: qty 1

## 2014-05-22 NOTE — Discharge Instructions (Signed)
Preterm Labor Information Preterm labor is when labor starts before you are [redacted] weeks pregnant. The normal length of pregnancy is 39 to 41 weeks.  CAUSES  The cause of preterm labor is not often known. The most common known cause is infection. RISK FACTORS  Having a history of preterm labor.  Having your water break before it should.  Having a placenta that covers the opening of the cervix.  Having a placenta that breaks away from the uterus.  Having a cervix that is too weak to hold the baby in the uterus.  Having too much fluid in the amniotic sac.  Taking drugs or smoking while pregnant.  Not gaining enough weight while pregnant.  Being younger than 18 and older than 20 years old.  Having a low income.  Being African American. SYMPTOMS  Period-like cramps, belly (abdominal) pain, or back pain.  Contractions that are regular, as often as six in an hour. They may be mild or painful.  Contractions that start at the top of the belly. They then move to the lower belly and back.  Lower belly pressure that seems to get stronger.  Bleeding from the vagina.  Fluid leaking from the vagina. TREATMENT  Treatment depends on:  Your condition.  The condition of your baby.  How many weeks pregnant you are. Your doctor may have you:  Take medicine to stop contractions.  Stay in bed except to use the restroom (bed rest).  Stay in the hospital. WHAT SHOULD YOU DO IF YOU THINK YOU ARE IN PRETERM LABOR? Call your doctor right away. You need to go to the hospital right away.  HOW CAN YOU PREVENT PRETERM LABOR IN FUTURE PREGNANCIES?  Stop smoking, if you smoke.  Maintain healthy weight gain.  Do not take drugs or be around chemicals that are not needed.  Tell your doctor if you think you have an infection.  Tell your doctor if you had a preterm labor before. Document Released: 10/09/2008 Document Revised: 05/03/2013 Document Reviewed: 10/09/2008 ExitCare Patient  Information 2015 [employer]. This information is not intended to replace advice given to you by your health care provider. Make sure you discuss any questions you have with your health care provider.  

## 2014-05-22 NOTE — MAU Note (Signed)
Pt states abd pain and back pain started approx 2 hours ago.  Pt states her headache has been off and on all day.  Took tylenol 30 minutes prior to coming without relief.  Good fetal movement.  Denies vaginal bleeding or ROM.

## 2014-05-22 NOTE — MAU Provider Note (Signed)
Chief Complaint:  Abdominal Pain, Back Pain and Headache   Laquanta L Anastos is a 18 y.o.  G2P1001 with IUP at [redacted]w[redacted]d presenting for Abdominal Pain, Back Pain and Headache . Patient states she has been having  none contractions, none vaginal bleeding, intact membranes, with active fetal movement.    Menstrual History: OB History   Grav Para Term Preterm Abortions TAB SAB Ect Mult Living   2 1 1  0 0 0 0 0 0 1       Patient's last menstrual period was 10/27/2013.      Past Medical History  Diagnosis Date  . Migraine with aura   . Supervision of other normal pregnancy 01/03/2014  . Chlamydia     History reviewed. No pertinent past surgical history.  Family History  Problem Relation Age of Onset  . Diabetes Maternal Grandmother   . Hypertension Maternal Grandmother     History  Substance Use Topics  . Smoking status: Never Smoker   . Smokeless tobacco: Never Used  . Alcohol Use: No     No Known Allergies  Prescriptions prior to admission  Medication Sig Dispense Refill  . Prenatal Multivit-Min-Fe-FA (PRENATAL VITAMINS PO) Take 1 tablet by mouth daily.      . promethazine (PHENERGAN) 25 MG tablet Take 1 tablet (25 mg total) by mouth every 6 (six) hours as needed for nausea or vomiting.  30 tablet  1    Review of Systems - Negative except for what is mentioned in HPI.  Physical Exam  Blood pressure 108/53, pulse 81, temperature 98.3 F (36.8 C), temperature source Oral, resp. rate 18, last menstrual period 10/27/2013, SpO2 99.00%. GENERAL: Well-developed, well-nourished female in no acute distress.  LUNGS: Clear to auscultation bilaterally.  HEART: Regular rate and rhythm. ABDOMEN: Soft, nontender, nondistended, gravid.  EXTREMITIES: Nontender, no edema, 2+ distal pulses. FHT:  Baseline rate 150 bpm   Variability moderate  Accelerations present   Decelerations none No contractions   Labs: No results found for this or any previous visit (from the past 24  hour(s)).  Imaging Studies:  No results found.  Assessment: EULINE KIMBLER is  18 y.o. G2P1001 at [redacted]w[redacted]d presents with Abdominal Pain, Back Pain and Headache .  Plan:  #Gestation of [redacted]w[redacted]d --Does not appear to be in preterm labor --No LOF, ctx, VB --Continue prenatal care as previously scheduled  #Abdominal and Lumbar pain --May be due to uterine contractions --Procardia in MAU, contractions resolved --Advised pt re importance of hydration.  --Follow up regarding contractions w prenatal care provider    Collene Gobble 10/27/20158:59 PM  Seen also by me Agree with note Seabron Spates, CNM

## 2014-05-24 ENCOUNTER — Inpatient Hospital Stay (HOSPITAL_COMMUNITY)
Admission: AD | Admit: 2014-05-24 | Discharge: 2014-05-24 | Disposition: A | Discharge status: Home / Self Care | Payer: Medicaid Other | Source: Ambulatory Visit | Attending: Obstetrics & Gynecology | Admitting: Obstetrics & Gynecology

## 2014-05-24 ENCOUNTER — Encounter (HOSPITAL_COMMUNITY): Payer: Self-pay | Admitting: *Deleted

## 2014-05-24 DIAGNOSIS — O4703 False labor before 37 completed weeks of gestation, third trimester: Secondary | ICD-10-CM | POA: Diagnosis present

## 2014-05-24 DIAGNOSIS — Z3A29 29 weeks gestation of pregnancy: Secondary | ICD-10-CM | POA: Diagnosis not present

## 2014-05-24 DIAGNOSIS — O479 False labor, unspecified: Secondary | ICD-10-CM

## 2014-05-24 MED ORDER — NIFEDIPINE 20 MG PO CAPS: 20.0000 mg | ORAL_CAPSULE | Freq: Three times a day (TID) | ORAL | Status: DC | PRN

## 2014-05-24 MED ORDER — NIFEDIPINE 10 MG PO CAPS: 20.0000 mg | ORAL_CAPSULE | Freq: Three times a day (TID) | ORAL | Status: DC | PRN

## 2014-05-24 NOTE — MAU Provider Note (Signed)
Attestation of Attending Supervision of Advanced Practitioner (CNM/NP): Evaluation and management procedures were performed by the Advanced Practitioner under my supervision and collaboration.  I have reviewed the Advanced Practitioner's note and chart, and I agree with the management and plan.  HARRAWAY-SMITH, Marcellous Snarski 9:23 PM

## 2014-05-24 NOTE — MAU Note (Signed)
PT  WAS HERE IN MAU  ON Tuesday - FOR UC - RECEIVED PROCARDIA-   [place].  THEN THIS AM  SHE STARTED  HAVING MORE UC-   WITH   SLIGHT LESS NOW.     CALLED [facility]-  FOR AN APPOINTMENT -  ONLY COULD GET  ONE FOR NEXT Monday.       LAST SEX-  10-10.

## 2014-05-24 NOTE — MAU Provider Note (Signed)
None     Chief Complaint:  Contractions.  Was seen in MAU 2 days ago, having a few ctx, given a procardia. She had a negative FFN and cx was FT.  Her contractions have now decreased since arriving to MAU. Admits to not drinking much fluid today  Obstetrical/Gynecological History: OB History   Grav Para Term Preterm Abortions TAB SAB Ect Mult Living   2 1 1  0 0 0 0 0 0 1     Past Medical History: Past Medical History  Diagnosis Date  . Migraine with aura   . Supervision of other normal pregnancy 01/03/2014  . Chlamydia     Past Surgical History: History reviewed. No pertinent past surgical history.  Family History: Family History  Problem Relation Age of Onset  . Diabetes Maternal Grandmother   . Hypertension Maternal Grandmother     Social History: History  Substance Use Topics  . Smoking status: Never Smoker   . Smokeless tobacco: Never Used  . Alcohol Use: No    Allergies: No Known Allergies  Meds:  Prescriptions prior to admission  Medication Sig Dispense Refill  . acetaminophen (TYLENOL) 325 MG tablet Take 325 mg by mouth once.        Review of Systems   Constitutional: Negative for fever and chills Eyes: Negative for visual disturbances Respiratory: Negative for shortness of breath, dyspnea Cardiovascular: Negative for chest pain or palpitations  Gastrointestinal: Negative for vomiting, diarrhea and constipation Genitourinary: Negative for dysuria and urgency Musculoskeletal: Negative for back pain, joint pain, myalgias  Neurological: Negative for dizziness and headaches     Physical Exam  Blood pressure 107/57, pulse 86, temperature 98.1 F (36.7 C), temperature source Oral, resp. rate 20, height 5\' 5"  (1.651 m), weight 70.081 kg (154 lb 8 oz), last menstrual period 10/27/2013. GENERAL: Well-developed, well-nourished female in no acute distress.  LUNGS: Clear to auscultation bilaterally.  HEART: Regular rate and rhythm. ABDOMEN: Soft, nontender,  nondistended, gravid.  EXTREMITIES: Nontender, no edema, 2+ distal pulses. DTR's 2+ CERVICAL EXAM: Dilatation 9m   Effacement 0   Station -2 Presentation: cephalic FHT:  Baseline rate 159m   Variability moderate  Accelerations present   Decelerations none Contractions: occasional   Labs: No results found for this or any previous visit (from the past 24 hour(s)). Imaging Studies:  No results found.  Assessment: Ruth Dunlap is  18 y.o. G2P1001 at [redacted]w[redacted]d presents with Ruth Dunlap Circle, now decreased  Plan: Pt given rx for procardia to use prn contractions, with instructions to f/u if she has more than 5-6/hour despite procardia and hydration  CRESENZO-DISHMAN,Dennette Faulconer 10/29/20158:35 PM

## 2014-05-24 NOTE — Discharge Instructions (Signed)
Braxton Hicks Contractions °Contractions of the uterus can occur throughout pregnancy. Contractions are not always a sign that you are in labor.  °WHAT ARE BRAXTON HICKS CONTRACTIONS?  °Contractions that occur before labor are called Braxton Hicks contractions, or false labor. Toward the end of pregnancy (32-34 weeks), these contractions can develop more often and may become more forceful. This is not true labor because these contractions do not result in opening (dilatation) and thinning of the cervix. They are sometimes difficult to tell apart from true labor because these contractions can be forceful and people have different pain tolerances. You should not feel embarrassed if you go to the hospital with false labor. Sometimes, the only way to tell if you are in true labor is for your health care provider to look for changes in the cervix. °If there are no prenatal problems or other health problems associated with the pregnancy, it is completely safe to be sent home with false labor and await the onset of true labor. °HOW CAN YOU TELL THE DIFFERENCE BETWEEN TRUE AND FALSE LABOR? °False Labor °· The contractions of false labor are usually shorter and not as hard as those of true labor.   °· The contractions are usually irregular.   °· The contractions are often felt in the front of the lower abdomen and in the groin.   °· The contractions may go away when you walk around or change positions while lying down.   °· The contractions get weaker and are shorter lasting as time goes on.   °· The contractions do not usually become progressively stronger, regular, and closer together as with true labor.   °True Labor °· Contractions in true labor last 30-70 seconds, become very regular, usually become more intense, and increase in frequency.   °· The contractions do not go away with walking.   °· The discomfort is usually felt in the top of the uterus and spreads to the lower abdomen and low back.   °· True labor can be  determined by your health care provider with an exam. This will show that the cervix is dilating and getting thinner.   °WHAT TO REMEMBER °· Keep up with your usual exercises and follow other instructions given by your health care provider.   °· Take medicines as directed by your health care provider.   °· Keep your regular prenatal appointments.   °· Eat and drink lightly if you think you are going into labor.   °· If Braxton Hicks contractions are making you uncomfortable:   °¨ Change your position from lying down or resting to walking, or from walking to resting.   °¨ Sit and rest in a tub of warm water.   °¨ Drink 2-3 glasses of water. Dehydration may cause these contractions.   °¨ Do slow and deep breathing several times an hour.   °WHEN SHOULD I SEEK IMMEDIATE MEDICAL CARE? °Seek immediate medical care if: °· Your contractions become stronger, more regular, and closer together.   °· You have fluid leaking or gushing from your vagina.   °· You have a fever.   °· You pass blood-tinged mucus.   °· You have vaginal bleeding.   °· You have continuous abdominal pain.   °· You have low back pain that you never had before.   °· You feel your baby's head pushing down and causing pelvic pressure.   °· Your baby is not moving as much as it used to.   °Document Released: 07/13/2005 Document Revised: 07/18/2013 Document Reviewed: 04/24/2013 °ExitCare® Patient Information ©2015 [employer]. This information is not intended to replace advice given to you by your health care   provider. Make sure you discuss any questions you have with your health care provider. ° °

## 2014-05-24 NOTE — MAU Note (Signed)
Pt off monitor per Manus Gunning CNM

## 2014-05-25 NOTE — MAU Provider Note (Signed)
Attestation of Attending Supervision of Advanced Practitioner (PA/CNM/NP): Evaluation and management procedures were performed by the Advanced Practitioner under my supervision and collaboration.  I have reviewed the Advanced Practitioner's note and chart, and I agree with the management and plan.  Chaston Bradburn, DO Attending Physician Faculty [facility], [facility]  

## 2014-05-28 ENCOUNTER — Ambulatory Visit (INDEPENDENT_AMBULATORY_CARE_PROVIDER_SITE_OTHER): Payer: Medicaid Other | Admitting: Women's Health

## 2014-05-28 ENCOUNTER — Encounter: Payer: Self-pay | Admitting: Women's Health

## 2014-05-28 VITALS — BP 118/58 | Wt 155.0 lb

## 2014-05-28 DIAGNOSIS — Z331 Pregnant state, incidental: Secondary | ICD-10-CM

## 2014-05-28 DIAGNOSIS — Z1389 Encounter for screening for other disorder: Secondary | ICD-10-CM

## 2014-05-28 DIAGNOSIS — Z3483 Encounter for supervision of other normal pregnancy, third trimester: Secondary | ICD-10-CM

## 2014-05-28 LAB — POCT URINALYSIS DIPSTICK
GLUCOSE UA: NEGATIVE
Ketones, UA: NEGATIVE
Leukocytes, UA: NEGATIVE
Nitrite, UA: NEGATIVE
Protein, UA: NEGATIVE
RBC UA: NEGATIVE

## 2014-05-28 NOTE — Progress Notes (Signed)
Low-risk OB appointment G2P1001 [redacted]w[redacted]d Estimated Date of Delivery: 08/03/14 BP 118/58 mmHg  Wt 155 lb (70.308 kg)  LMP 10/27/2013  BP, weight, and urine reviewed.  Refer to obstetrical flow sheet for FH & FHR.  Reports good fm.  Denies regular uc's, lof, vb, or uti s/s. No complaints. Has missed last 2 appts and pn2 d/t [place] and transportation. Went to [facility] 10/29 for preterm uc's- given rx for procardia which she hasn't picked up yet.  Reviewed ptl s/s, fkc. Plan:  Continue routine obstetrical care  F/U ASAP for PN2 (no visit), then 2wks for OB appointment  Declines flu shot today

## 2014-05-28 NOTE — Patient Instructions (Addendum)
You will have your sugar test next visit.  Please do not eat or drink anything after midnight the night before you come, not even water.  You will be here for at least two hours.     Call the office ([phone]) or go to [facility] if:  You begin to have strong, frequent contractions  Your water breaks.  Sometimes it is a big gush of fluid, sometimes it is just a trickle that keeps getting your panties wet or running down your legs  You have vaginal bleeding.  It is normal to have a small amount of spotting if your cervix was checked.   You don't feel your baby moving like normal.  If you don't, get you something to eat and drink and lay down and focus on feeling your baby move.  You should feel at least 10 movements in 2 hours.  If you don't, you should call the office or go to [facility].    Tdap Vaccine  It is recommended that you get the Tdap vaccine during the third trimester of EACH pregnancy to help protect your baby from getting pertussis (whooping cough)  27-36 weeks is the BEST time to do this so that you can pass the protection on to your baby. During pregnancy is better than after pregnancy, but if you are unable to get it during pregnancy it will be offered at the hospital.   You can get this vaccine at the health department or your family doctor  Everyone who will be around your baby should also be up-to-date on their vaccines. Adults (who are not pregnant) only need 1 dose of Tdap during adulthood.   Third Trimester of Pregnancy The third trimester is from week 29 through week 42, months 7 through 9. The third trimester is a time when the fetus is growing rapidly. At the end of the ninth month, the fetus is about 20 inches in length and weighs 6-10 pounds.  BODY CHANGES Your body goes through many changes during pregnancy. The changes vary from woman to woman.   Your weight will continue to increase. You can expect to gain 25-35 pounds (11-16 kg) by the end of the  pregnancy.  You may begin to get stretch marks on your hips, abdomen, and breasts.  You may urinate more often because the fetus is moving lower into your pelvis and pressing on your bladder.  You may develop or continue to have heartburn as a result of your pregnancy.  You may develop constipation because certain hormones are causing the muscles that push waste through your intestines to slow down.  You may develop hemorrhoids or swollen, bulging veins (varicose veins).  You may have pelvic pain because of the weight gain and pregnancy hormones relaxing your joints between the bones in your pelvis. Backaches may result from overexertion of the muscles supporting your posture.  You may have changes in your hair. These can include thickening of your hair, rapid growth, and changes in texture. Some women also have hair loss during or after pregnancy, or hair that feels dry or thin. Your hair will most likely return to normal after your baby is born.  Your breasts will continue to grow and be tender. A yellow discharge may leak from your breasts called colostrum.  Your belly button may stick out.  You may feel short of breath because of your expanding uterus.  You may notice the fetus "dropping," or moving lower in your abdomen.  You may have   a bloody mucus discharge. This usually occurs a few days to a week before labor begins.  Your cervix becomes thin and soft (effaced) near your due date. WHAT TO EXPECT AT YOUR PRENATAL EXAMS  You will have prenatal exams every 2 weeks until week 36. Then, you will have weekly prenatal exams. During a routine prenatal visit:  You will be weighed to make sure you and the fetus are growing normally.  Your blood pressure is taken.  Your abdomen will be measured to track your baby's growth.  The fetal heartbeat will be listened to.  Any test results from the previous visit will be discussed.  You may have a cervical check near your due date to  see if you have effaced. At around 36 weeks, your caregiver will check your cervix. At the same time, your caregiver will also perform a test on the secretions of the vaginal tissue. This test is to determine if a type of bacteria, Group B streptococcus, is present. Your caregiver will explain this further. Your caregiver may ask you:  What your birth plan is.  How you are feeling.  If you are feeling the baby move.  If you have had any abnormal symptoms, such as leaking fluid, bleeding, severe headaches, or abdominal cramping.  If you have any questions. Other tests or screenings that may be performed during your third trimester include:  Blood tests that check for low iron levels (anemia).  Fetal testing to check the health, activity level, and growth of the fetus. Testing is done if you have certain medical conditions or if there are problems during the pregnancy. FALSE LABOR You may feel small, irregular contractions that eventually go away. These are called Braxton Hicks contractions, or false labor. Contractions may last for hours, days, or even weeks before true labor sets in. If contractions come at regular intervals, intensify, or become painful, it is best to be seen by your caregiver.  SIGNS OF LABOR   Menstrual-like cramps.  Contractions that are 5 minutes apart or less.  Contractions that start on the top of the uterus and spread down to the lower abdomen and back.  A sense of increased pelvic pressure or back pain.  A watery or bloody mucus discharge that comes from the vagina. If you have any of these signs before the 37th week of pregnancy, call your caregiver right away. You need to go to the hospital to get checked immediately. HOME CARE INSTRUCTIONS   Avoid all smoking, herbs, alcohol, and unprescribed drugs. These chemicals affect the formation and growth of the baby.  Follow your caregiver's instructions regarding medicine use. There are medicines that are  either safe or unsafe to take during pregnancy.  Exercise only as directed by your caregiver. Experiencing uterine cramps is a good sign to stop exercising.  Continue to eat regular, healthy meals.  Wear a good support bra for breast tenderness.  Do not use hot tubs, steam rooms, or saunas.  Wear your seat belt at all times when driving.  Avoid raw meat, uncooked cheese, cat litter boxes, and soil used by cats. These carry germs that can cause birth defects in the baby.  Take your prenatal vitamins.  Try taking a stool softener (if your caregiver approves) if you develop constipation. Eat more high-fiber foods, such as fresh vegetables or fruit and whole grains. Drink plenty of fluids to keep your urine clear or pale yellow.  Take warm sitz baths to soothe any pain or discomfort caused   by hemorrhoids. Use hemorrhoid cream if your caregiver approves.  If you develop varicose veins, wear support hose. Elevate your feet for 15 minutes, 3-4 times a day. Limit salt in your diet.  Avoid heavy lifting, wear low heal shoes, and practice good posture.  Rest a lot with your legs elevated if you have leg cramps or low back pain.  Visit your dentist if you have not gone during your pregnancy. Use a soft toothbrush to brush your teeth and be gentle when you floss.  A sexual relationship may be continued unless your caregiver directs you otherwise.  Do not travel far distances unless it is absolutely necessary and only with the approval of your caregiver.  Take prenatal classes to understand, practice, and ask questions about the labor and delivery.  Make a trial run to the hospital.  Pack your hospital bag.  Prepare the baby's nursery.  Continue to go to all your prenatal visits as directed by your caregiver. SEEK MEDICAL CARE IF:  You are unsure if you are in labor or if your water has broken.  You have dizziness.  You have mild pelvic cramps, pelvic pressure, or nagging pain in  your abdominal area.  You have persistent nausea, vomiting, or diarrhea.  You have a bad smelling vaginal discharge.  You have pain with urination. SEEK IMMEDIATE MEDICAL CARE IF:   You have a fever.  You are leaking fluid from your vagina.  You have spotting or bleeding from your vagina.  You have severe abdominal cramping or pain.  You have rapid weight loss or gain.  You have shortness of breath with chest pain.  You notice sudden or extreme swelling of your face, hands, ankles, feet, or legs.  You have not felt your baby move in over an hour.  You have severe headaches that do not go away with medicine.  You have vision changes. Document Released: 07/07/2001 Document Revised: 07/18/2013 Document Reviewed: 09/13/2012 [employer] Patient Information 2015 [employer]. This information is not intended to replace advice given to you by your health care provider. Make sure you discuss any questions you have with your health care provider.   

## 2014-05-29 ENCOUNTER — Other Ambulatory Visit: Payer: Medicaid Other

## 2014-05-29 DIAGNOSIS — Z331 Pregnant state, incidental: Secondary | ICD-10-CM

## 2014-05-29 DIAGNOSIS — Z114 Encounter for screening for human immunodeficiency virus [HIV]: Secondary | ICD-10-CM

## 2014-05-29 DIAGNOSIS — Z113 Encounter for screening for infections with a predominantly sexual mode of transmission: Secondary | ICD-10-CM

## 2014-05-29 DIAGNOSIS — Z3483 Encounter for supervision of other normal pregnancy, third trimester: Secondary | ICD-10-CM

## 2014-05-29 DIAGNOSIS — Z0184 Encounter for antibody response examination: Secondary | ICD-10-CM

## 2014-05-29 DIAGNOSIS — Z131 Encounter for screening for diabetes mellitus: Secondary | ICD-10-CM

## 2014-05-29 LAB — CBC
HEMATOCRIT: 29.9 % — AB (ref 36.0–46.0)
Hemoglobin: 10.2 g/dL — ABNORMAL LOW (ref 12.0–15.0)
MCH: 27.2 pg (ref 26.0–34.0)
MCHC: 34.1 g/dL (ref 30.0–36.0)
MCV: 79.7 fL (ref 78.0–100.0)
PLATELETS: 243 10*3/uL (ref 150–400)
RBC: 3.75 MIL/uL — AB (ref 3.87–5.11)
RDW: 14.2 % (ref 11.5–15.5)
WBC: 9.1 10*3/uL (ref 4.0–10.5)

## 2014-05-30 LAB — GLUCOSE TOLERANCE, 2 HOURS W/ 1HR
Glucose, 1 hour: 108 mg/dL (ref 70–170)
Glucose, 2 hour: 100 mg/dL (ref 70–139)
Glucose, Fasting: 76 mg/dL (ref 70–99)

## 2014-05-30 LAB — HSV 2 ANTIBODY, IGG

## 2014-05-30 LAB — HIV ANTIBODY (ROUTINE TESTING W REFLEX): HIV 1&2 Ab, 4th Generation: NONREACTIVE

## 2014-05-30 LAB — ANTIBODY SCREEN: Antibody Screen: NEGATIVE

## 2014-05-30 LAB — RPR

## 2014-06-07 ENCOUNTER — Inpatient Hospital Stay (HOSPITAL_COMMUNITY)
Admission: AD | Admit: 2014-06-07 | Discharge: 2014-06-07 | Disposition: A | Discharge status: Home / Self Care | Payer: Medicaid Other | Source: Ambulatory Visit | Attending: Obstetrics & Gynecology | Admitting: Obstetrics & Gynecology

## 2014-06-07 ENCOUNTER — Encounter (HOSPITAL_COMMUNITY): Payer: Self-pay

## 2014-06-07 DIAGNOSIS — R1030 Lower abdominal pain, unspecified: Secondary | ICD-10-CM | POA: Diagnosis not present

## 2014-06-07 DIAGNOSIS — M549 Dorsalgia, unspecified: Secondary | ICD-10-CM | POA: Diagnosis not present

## 2014-06-07 DIAGNOSIS — Z3A31 31 weeks gestation of pregnancy: Secondary | ICD-10-CM | POA: Diagnosis not present

## 2014-06-07 DIAGNOSIS — O26893 Other specified pregnancy related conditions, third trimester: Secondary | ICD-10-CM | POA: Diagnosis present

## 2014-06-07 DIAGNOSIS — R112 Nausea with vomiting, unspecified: Secondary | ICD-10-CM | POA: Diagnosis not present

## 2014-06-07 DIAGNOSIS — O219 Vomiting of pregnancy, unspecified: Secondary | ICD-10-CM

## 2014-06-07 LAB — URINALYSIS, ROUTINE W REFLEX MICROSCOPIC
Bilirubin Urine: NEGATIVE
Glucose, UA: NEGATIVE mg/dL
Hgb urine dipstick: NEGATIVE
Ketones, ur: NEGATIVE mg/dL
Nitrite: NEGATIVE
PROTEIN: NEGATIVE mg/dL
Specific Gravity, Urine: 1.025 (ref 1.005–1.030)
UROBILINOGEN UA: 2 mg/dL — AB (ref 0.0–1.0)
pH: 7 (ref 5.0–8.0)

## 2014-06-07 LAB — URINE MICROSCOPIC-ADD ON

## 2014-06-07 MED ORDER — ACETAMINOPHEN 325 MG PO TABS
650.0000 mg | ORAL_TABLET | Freq: Once | ORAL | Status: DC
  Filled 2014-06-07: qty 2

## 2014-06-07 MED ORDER — ONDANSETRON 8 MG PO TBDP
8.0000 mg | ORAL_TABLET | Freq: Once | ORAL | Status: AC
  Administered 2014-06-07: 8 mg via ORAL
  Filled 2014-06-07: qty 1

## 2014-06-07 MED ORDER — ONDANSETRON 8 MG PO TBDP: 8.0000 mg | ORAL_TABLET | Freq: Three times a day (TID) | ORAL | Status: DC | PRN

## 2014-06-07 MED ORDER — ACETAMINOPHEN 500 MG PO TABS
1000.0000 mg | ORAL_TABLET | Freq: Once | ORAL | Status: DC
Start: 2014-06-07 — End: 2014-06-07

## 2014-06-07 MED ORDER — LACTATED RINGERS IV SOLN
25.0000 mg | Freq: Once | INTRAVENOUS | Status: AC
  Administered 2014-06-07: 25 mg via INTRAVENOUS
  Filled 2014-06-07: qty 1

## 2014-06-07 NOTE — Discharge Instructions (Signed)

## 2014-06-07 NOTE — MAU Provider Note (Signed)
History     CSN: [id number]  Arrival date and time: 06/07/14 0104   First Provider Initiated Contact with Patient 06/07/14 0150      Chief Complaint  Patient presents with  . Abdominal Pain  . Back Pain  . Emesis   HPI   Ruth Dunlap is a 18 y.o. G2P1001 at [redacted]w[redacted]d who presents with one day of nausea/vomiting, headache, low back pain, and lower abdominal pain.  Says all of her symptoms started yesterday morning and have progressively worsened. Emesis is nonbloody and nonbilious. Had one episode of diarrhea yesterday morning. Has been unable to keep anything down. No recent sick contacts. No dysuria. Patient denies any fevers or chills. Reports that she has had persistent nausea and vomiting throughout this pregnancy, but significantly worsened in the past day. Has not tried anything for her headache, back pain, or abdominal pain. Has not noticed anything that makes the pain better or worse. Denies any vision changes.   Patient reports feeling occasional irregular contractions. Denies LOF or vaginal bleeding. Endorses active fetal movement.   OB History    Gravida Para Term Preterm AB TAB SAB Ectopic Multiple Living   2 1 1  0 0 0 0 0 0 1      Past Medical History  Diagnosis Date  . Migraine with aura   . Supervision of other normal pregnancy 01/03/2014  . Chlamydia     Past Surgical History  Procedure Laterality Date  . No past surgeries      Family History  Problem Relation Age of Onset  . Diabetes Maternal Grandmother   . Hypertension Maternal Grandmother     History  Substance Use Topics  . Smoking status: Never Smoker   . Smokeless tobacco: Never Used  . Alcohol Use: No    Allergies: No Known Allergies  Prescriptions prior to admission  Medication Sig Dispense Refill Last Dose  . acetaminophen (TYLENOL) 325 MG tablet Take 325 mg by mouth once.   Past Month at Unknown time  . NIFEdipine (PROCARDIA) 20 MG capsule Take 1 capsule (20 mg total) by mouth every  8 (eight) hours as needed (for contractions). 300 capsule 0 Past Week at Unknown time    Review of Systems  All other systems reviewed and are negative.  Physical Exam   Blood pressure 123/61, pulse 86, temperature 98 F (36.7 C), temperature source Oral, resp. rate 18, height 5\' 5"  (1.651 m), weight 68.947 kg (152 lb), last menstrual period 10/27/2013, SpO2 100 %.  Physical Exam  Nursing note and vitals reviewed. Constitutional: She is oriented to person, place, and time. She appears well-developed and well-nourished. No distress.  HENT:  Head: Normocephalic and atraumatic.  Eyes: EOM are normal.  Neck: Normal range of motion.  Cardiovascular: Normal rate, regular rhythm and normal heart sounds.   No murmur heard. Respiratory: Effort normal and breath sounds normal. No respiratory distress. She has no wheezes.  GI: Soft. Bowel sounds are normal. She exhibits no distension. There is tenderness (Mild suprapubic tenderness). There is no rebound and no guarding.  Gravid abdomen  Musculoskeletal: Normal range of motion. She exhibits no edema.  Neurological: She is alert and oriented to person, place, and time. She has normal reflexes.  Skin: Skin is warm and dry.  FHT:  FHR: 140 bpm, variability: moderate,  accelerations:  present,  decelerations:  Absent UC: Occasional irregular contraction, approximately 1-2 per hour  Procedures-None  Results for orders placed or performed during the hospital encounter of  06/07/14 (from the past 24 hour(s))  Urinalysis, Routine w reflex microscopic     Status: Abnormal   Collection Time: 06/07/14  1:22 AM  Result Value Ref Range   Color, Urine YELLOW YELLOW   APPearance CLEAR CLEAR   Specific Gravity, Urine 1.025 1.005 - 1.030   pH 7.0 5.0 - 8.0   Glucose, UA NEGATIVE NEGATIVE mg/dL   Hgb urine dipstick NEGATIVE NEGATIVE   Bilirubin Urine NEGATIVE NEGATIVE   Ketones, ur NEGATIVE NEGATIVE mg/dL   Protein, ur NEGATIVE NEGATIVE mg/dL    Urobilinogen, UA 2.0 (H) 0.0 - 1.0 mg/dL   Nitrite NEGATIVE NEGATIVE   Leukocytes, UA SMALL (A) NEGATIVE  Urine microscopic-add on     Status: Abnormal   Collection Time: 06/07/14  1:22 AM  Result Value Ref Range   Squamous Epithelial / LPF FEW (A) RARE   WBC, UA 7-10 <3 WBC/hpf   RBC / HPF 3-6 <3 RBC/hpf   Bacteria, UA MANY (A) RARE    Assessment and Plan  A: [redacted]w[redacted]d IUP. FHT category 1. LR bolus and phenergan given in MAU with minimal improvement in symptoms. 8mg  Zofran given with resolution of vomiting.  P: Discharge home in stable condition Prescription for zofran given. Encouraged hydration.  Preterm labor precautions reviewed.   Dimas Chyle 06/07/2014, 1:58 AM   I have seen and examined this patient and I agree with the above. Serita Grammes CNM 7:18 AM 06/07/2014

## 2014-06-07 NOTE — MAU Note (Signed)
Pt reports vomiting since yesterday am, mid back pain, lower abd pain , headache. Denies fever

## 2014-06-08 ENCOUNTER — Encounter (HOSPITAL_COMMUNITY): Payer: Self-pay

## 2014-06-11 ENCOUNTER — Encounter: Payer: Self-pay | Admitting: Women's Health

## 2014-06-11 ENCOUNTER — Ambulatory Visit (INDEPENDENT_AMBULATORY_CARE_PROVIDER_SITE_OTHER): Payer: Medicaid Other | Admitting: Women's Health

## 2014-06-11 VITALS — BP 102/58 | Wt 155.0 lb

## 2014-06-11 DIAGNOSIS — Z3493 Encounter for supervision of normal pregnancy, unspecified, third trimester: Secondary | ICD-10-CM

## 2014-06-11 DIAGNOSIS — Z1389 Encounter for screening for other disorder: Secondary | ICD-10-CM

## 2014-06-11 DIAGNOSIS — Z331 Pregnant state, incidental: Secondary | ICD-10-CM

## 2014-06-11 LAB — POCT URINALYSIS DIPSTICK
Glucose, UA: NEGATIVE
Ketones, UA: NEGATIVE
NITRITE UA: NEGATIVE
RBC UA: NEGATIVE

## 2014-06-11 MED ORDER — CITRANATAL 90 DHA 90-1 & 300 MG PO MISC: ORAL | Status: DC

## 2014-06-11 MED ORDER — CONCEPT DHA 53.5-38-1 MG PO CAPS: 1.0000 | ORAL_CAPSULE | Freq: Every day | ORAL | Status: DC

## 2014-06-11 NOTE — Patient Instructions (Addendum)
Call the office ([phone]) or go to [facility] if:  You begin to have strong, frequent contractions  Your water breaks.  Sometimes it is a big gush of fluid, sometimes it is just a trickle that keeps getting your panties wet or running down your legs  You have vaginal bleeding.  It is normal to have a small amount of spotting if your cervix was checked.   You don't feel your baby moving like normal.  If you don't, get you something to eat and drink and lay down and focus on feeling your baby move.  You should feel at least 10 movements in 2 hours.  If you don't, you should call the office or go to [facility].    Preterm Labor Information Preterm labor is when labor starts at less than 37 weeks of pregnancy. The normal length of a pregnancy is 39 to 41 weeks. CAUSES Often, there is no identifiable underlying cause as to why a woman goes into preterm labor. One of the most common known causes of preterm labor is infection. Infections of the uterus, cervix, vagina, amniotic sac, bladder, kidney, or even the lungs (pneumonia) can cause labor to start. Other suspected causes of preterm labor include:   Urogenital infections, such as yeast infections and bacterial vaginosis.   Uterine abnormalities (uterine shape, uterine septum, fibroids, or bleeding from the placenta).   A cervix that has been operated on (it may fail to stay closed).   Malformations in the fetus.   Multiple gestations (twins, triplets, and so on).   Breakage of the amniotic sac.  RISK FACTORS  Having a previous history of preterm labor.   Having premature rupture of membranes (PROM).   Having a placenta that covers the opening of the cervix (placenta previa).   Having a placenta that separates from the uterus (placental abruption).   Having a cervix that is too weak to hold the fetus in the uterus (incompetent cervix).   Having too much fluid in the amniotic sac (polyhydramnios).   Taking  illegal drugs or smoking while pregnant.   Not gaining enough weight while pregnant.   Being younger than 18 and older than 20 years old.   Having a low socioeconomic status.   Being African American. SYMPTOMS Signs and symptoms of preterm labor include:   Menstrual-like cramps, abdominal pain, or back pain.  Uterine contractions that are regular, as frequent as six in an hour, regardless of their intensity (may be mild or painful).  Contractions that start on the top of the uterus and spread down to the lower abdomen and back.   A sense of increased pelvic pressure.   A watery or bloody mucus discharge that comes from the vagina.  TREATMENT Depending on the length of the pregnancy and other circumstances, your health care provider may suggest bed rest. If necessary, there are medicines that can be given to stop contractions and to mature the fetal lungs. If labor happens before 34 weeks of pregnancy, a prolonged hospital stay may be recommended. Treatment depends on the condition of both you and the fetus.  WHAT SHOULD YOU DO IF YOU THINK YOU ARE IN PRETERM LABOR? Call your health care provider right away. You will need to go to the hospital to get checked immediately. HOW CAN YOU PREVENT PRETERM LABOR IN FUTURE PREGNANCIES? You should:   Stop smoking if you smoke.  Maintain healthy weight gain and avoid chemicals and drugs that are not necessary.  Be watchful for   any type of infection.  Inform your health care provider if you have a known history of preterm labor. Document Released: 10/03/2003 Document Revised: 03/15/2013 Document Reviewed: 08/15/2012 [facility] Patient Information 2015 [place], [place]. This information is not intended to replace advice given to you by your health care provider. Make sure you discuss any questions you have with your health care provider.  Iron-Rich Diet An iron-rich diet contains foods that are good sources of iron. Iron is an important  mineral that helps your body produce hemoglobin. Hemoglobin is a protein in red blood cells that carries oxygen to the body's tissues. Sometimes, the iron level in your blood can be low. This may be caused by:  A lack of iron in your diet.  Blood loss.  Times of growth, such as during pregnancy or during a child's growth and development. Low levels of iron can cause a decrease in the number of red blood cells. This can result in iron deficiency anemia. Iron deficiency anemia symptoms include:  Tiredness.  Weakness.  Irritability.  Increased chance of infection. Here are some recommendations for daily iron intake:  Males older than 20 years of age need 8 mg of iron per day.  Women ages 80 to 65 need 18 mg of iron per day.  Pregnant women need 27 mg of iron per day, and women who are over 38 years of age and breastfeeding need 9 mg of iron per day.  Women over the age of 66 need 8 mg of iron per day. SOURCES OF IRON There are 2 types of iron that are found in food: heme iron and nonheme iron. Heme iron is absorbed by the body better than nonheme iron. Heme iron is found in meat, poultry, and fish. Nonheme iron is found in grains, beans, and vegetables. Heme Iron Sources Food / Iron (mg)  Chicken liver, 3 oz (85 g)/ 10 mg  Beef liver, 3 oz (85 g)/ 5.5 mg  Oysters, 3 oz (85 g)/ 8 mg  Beef, 3 oz (85 g)/ 2 to 3 mg  Shrimp, 3 oz (85 g)/ 2.8 mg  [place], 3 oz (85 g)/ 2 mg  Chicken, 3 oz (85 g) / 1 mg  Fish (tuna, halibut), 3 oz (85 g)/ 1 mg  Pork, 3 oz (85 g)/ 0.9 mg Nonheme Iron Sources Food / Iron (mg)  Ready-to-eat breakfast cereal, iron-fortified / 3.9 to 7 mg  Tofu,  cup / 3.4 mg  Kidney beans,  cup / 2.6 mg  Baked potato with skin / 2.7 mg  Asparagus,  cup / 2.2 mg  Avocado / 2 mg  Dried peaches,  cup / 1.6 mg  Raisins,  cup / 1.5 mg  Soy milk, 1 cup / 1.5 mg  Whole-wheat bread, 1 slice / 1.2 mg  Spinach, 1 cup / 0.8 mg  Broccoli,  cup / 0.6  mg IRON ABSORPTION Certain foods can decrease the body's absorption of iron. Try to avoid these foods and beverages while eating meals with iron-containing foods:  Coffee.  Tea.  Fiber.  Soy. Foods containing vitamin C can help increase the amount of iron your body absorbs from iron sources, especially from nonheme sources. Eat foods with vitamin C along with iron-containing foods to increase your iron absorption. Foods that are high in vitamin C include many fruits and vegetables. Some good sources are:  Fresh orange juice.  Oranges.  Strawberries.  Mangoes.  Grapefruit.  Red bell peppers.  Green bell peppers.  Broccoli.  Potatoes  with skin.  Tomato juice. Document Released: 02/24/2005 Document Revised: 10/05/2011 Document Reviewed: 01/01/2011 [facility] Patient Information 2015 [place], [place]. This information is not intended to replace advice given to you by your health care provider. Make sure you discuss any questions you have with your health care provider.

## 2014-06-11 NOTE — Progress Notes (Signed)
Low-risk OB appointment G2P1001 [redacted]w[redacted]d Estimated Date of Delivery: 08/03/14 BP 102/58 mmHg  Wt 155 lb (70.308 kg)  LMP 10/27/2013  BP, weight, and urine reviewed.  Refer to obstetrical flow sheet for FH & FHR.  Reports good fm.  Denies regular uc's, lof, vb, or uti s/s. No complaints. Reviewed pn2 results, Hgb 10.2- not taking pnv- states she has never had any- Rx for citranatal 90 sent in, also increase Fe-rich foods. Discussed ptl s/s, fkc.  Plan:  Continue routine obstetrical care  F/U in 2wks for OB appointment

## 2014-06-25 ENCOUNTER — Encounter: Payer: Self-pay | Admitting: Obstetrics & Gynecology

## 2014-06-25 ENCOUNTER — Ambulatory Visit (INDEPENDENT_AMBULATORY_CARE_PROVIDER_SITE_OTHER): Payer: Medicaid Other | Admitting: Obstetrics & Gynecology

## 2014-06-25 VITALS — BP 110/70 | Wt 158.0 lb

## 2014-06-25 DIAGNOSIS — Z331 Pregnant state, incidental: Secondary | ICD-10-CM

## 2014-06-25 DIAGNOSIS — Z1389 Encounter for screening for other disorder: Secondary | ICD-10-CM

## 2014-06-25 DIAGNOSIS — Z3483 Encounter for supervision of other normal pregnancy, third trimester: Secondary | ICD-10-CM

## 2014-06-25 LAB — POCT URINALYSIS DIPSTICK
Blood, UA: NEGATIVE
GLUCOSE UA: NEGATIVE
KETONES UA: NEGATIVE
Leukocytes, UA: NEGATIVE
Nitrite, UA: NEGATIVE
Protein, UA: NEGATIVE

## 2014-06-25 NOTE — Addendum Note (Signed)
Addended by: Farley Ly on: 06/25/2014 02:25 PM   Modules accepted: Orders

## 2014-06-25 NOTE — Progress Notes (Signed)
G2P1001 [redacted]w[redacted]d Estimated Date of Delivery: 08/03/14  Blood pressure 110/70, weight 158 lb (71.668 kg), last menstrual period 10/27/2013.   BP weight and urine results all reviewed and noted.  Please refer to the obstetrical flow sheet for the fundal height and fetal heart rate documentation:  Patient reports good fetal movement, denies any bleeding and no rupture of membranes symptoms or regular contractions. Patient is without complaints. All questions were answered.  Plan:  Continued routine obstetrical care,   Follow up in 2 weeks for OB appointment, routine  Takes procardia prn

## 2014-07-02 ENCOUNTER — Encounter (HOSPITAL_COMMUNITY): Payer: Self-pay | Admitting: *Deleted

## 2014-07-02 ENCOUNTER — Emergency Department (HOSPITAL_COMMUNITY)
Admission: EM | Admit: 2014-07-02 | Discharge: 2014-07-02 | Disposition: A | Discharge status: Home / Self Care | Payer: Medicaid Other | Attending: Emergency Medicine | Admitting: Emergency Medicine

## 2014-07-02 DIAGNOSIS — Z79899 Other long term (current) drug therapy: Secondary | ICD-10-CM | POA: Insufficient documentation

## 2014-07-02 DIAGNOSIS — O219 Vomiting of pregnancy, unspecified: Secondary | ICD-10-CM | POA: Insufficient documentation

## 2014-07-02 DIAGNOSIS — R1110 Vomiting, unspecified: Secondary | ICD-10-CM | POA: Diagnosis present

## 2014-07-02 DIAGNOSIS — J029 Acute pharyngitis, unspecified: Secondary | ICD-10-CM

## 2014-07-02 DIAGNOSIS — Z3A00 Weeks of gestation of pregnancy not specified: Secondary | ICD-10-CM | POA: Insufficient documentation

## 2014-07-02 DIAGNOSIS — R079 Chest pain, unspecified: Secondary | ICD-10-CM | POA: Diagnosis present

## 2014-07-02 LAB — URINALYSIS, ROUTINE W REFLEX MICROSCOPIC
Bilirubin Urine: NEGATIVE
GLUCOSE, UA: NEGATIVE mg/dL
Hgb urine dipstick: NEGATIVE
KETONES UR: NEGATIVE mg/dL
NITRITE: NEGATIVE
PROTEIN: NEGATIVE mg/dL
Specific Gravity, Urine: 1.02 (ref 1.005–1.030)
Urobilinogen, UA: 0.2 mg/dL (ref 0.0–1.0)
pH: 7 (ref 5.0–8.0)

## 2014-07-02 LAB — URINE MICROSCOPIC-ADD ON

## 2014-07-02 MED ORDER — SODIUM CHLORIDE 0.9 % IV SOLN
Freq: Once | INTRAVENOUS | Status: AC
  Administered 2014-07-02: 05:00:00 via INTRAVENOUS

## 2014-07-02 MED ORDER — DIPHENHYDRAMINE HCL 50 MG/ML IJ SOLN
25.0000 mg | Freq: Once | INTRAMUSCULAR | Status: AC
  Administered 2014-07-02: 25 mg via INTRAVENOUS
  Filled 2014-07-02: qty 1

## 2014-07-02 MED ORDER — METOCLOPRAMIDE HCL 5 MG/ML IJ SOLN
10.0000 mg | Freq: Once | INTRAMUSCULAR | Status: AC
  Administered 2014-07-02: 10 mg via INTRAVENOUS
  Filled 2014-07-02: qty 2

## 2014-07-02 MED ORDER — METOCLOPRAMIDE HCL 10 MG PO TABS: 10.0000 mg | ORAL_TABLET | Freq: Four times a day (QID) | ORAL | Status: DC | PRN

## 2014-07-02 NOTE — ED Notes (Addendum)
Pt. Tolerating PO fluids.

## 2014-07-02 NOTE — ED Provider Notes (Signed)
CSN: [id number]     Arrival date & time 07/02/14  0359 History   First MD Initiated Contact with Patient 07/02/14 0545     Chief Complaint  Patient presents with  . Vomiting      (Consider location/radiation/quality/duration/timing/severity/associated sxs/prior Treatment) HPI  This is a 18 year old female approximately 8 months pregnant. She has had a history of vomiting throughout her pregnancy not adequately relieved by Zofran. She has a one-day history of nasal congestion and sore throat. She denies fever or cough. She is here with an exacerbation of her vomiting not relieved with Zofran ODT at home. She denies abdominal pain or dysuria.  Past Medical History  Diagnosis Date  . Migraine with aura   . Supervision of other normal pregnancy 01/03/2014  . Chlamydia    Past Surgical History  Procedure Laterality Date  . No past surgeries     Family History  Problem Relation Age of Onset  . Diabetes Maternal Grandmother   . Hypertension Maternal Grandmother    History  Substance Use Topics  . Smoking status: Never Smoker   . Smokeless tobacco: Never Used  . Alcohol Use: No   OB History    Gravida Para Term Preterm AB TAB SAB Ectopic Multiple Living   2 1 1  0 0 0 0 0 0 1     Review of Systems  All other systems reviewed and are negative.   Allergies  Review of patient's allergies indicates no known allergies.  Home Medications   Prior to Admission medications   Medication Sig Start Date End Date Taking? Authorizing Provider  ondansetron (ZOFRAN ODT) 8 MG disintegrating tablet Take 1 tablet (8 mg total) by mouth every 8 (eight) hours as needed for nausea or vomiting. 06/07/14  Yes Dimas Chyle, MD  Prenat w/o A-FeCbGl-DSS-FA-DHA (CITRANATAL 90 DHA) 90-1 & 300 MG MISC 1 tablet and 1 capsule daily 06/11/14  Yes Hale Drone Booker, CNM  acetaminophen (TYLENOL) 325 MG tablet Take 325 mg by mouth once.    Historical Provider, MD  NIFEdipine (PROCARDIA) 20 MG capsule  Take 1 capsule (20 mg total) by mouth every 8 (eight) hours as needed (for contractions). 05/24/14   Christin Fudge, CNM   BP 109/55 mmHg  Pulse 81  Temp(Src) 98.3 F (36.8 C) (Oral)  Resp 15  SpO2 98%  LMP 10/27/2013   Physical Exam  General: Well-developed, well-nourished female in no acute distress; appearance consistent with age of record HENT: normocephalic; atraumatic; no pharyngeal edema, erythema or exudate; no dysphonia Eyes: pupils equal, round and reactive to light; extraocular muscles intact Neck: supple Heart: regular rate and rhythm Lungs: clear to auscultation bilaterally Abdomen: soft; gravid, consistent with dates; nontender; bowel sounds present Extremities: No deformity; full range of motion; pulses normal Neurologic: Awake, alert and oriented; motor function intact in all extremities and symmetric; no facial droop Skin: Warm and dry Psychiatric: Flat affect    ED Course  Procedures (including critical care time)   MDM   Nursing notes and vitals signs, including pulse oximetry, reviewed.  Summary of this visit's results, reviewed by myself:  Labs:  Results for orders placed or performed during the hospital encounter of 07/02/14 (from the past 24 hour(s))  Urinalysis, Routine w reflex microscopic     Status: Abnormal   Collection Time: 07/02/14  4:46 AM  Result Value Ref Range   Color, Urine YELLOW YELLOW   APPearance CLEAR CLEAR   Specific Gravity, Urine 1.020 1.005 - 1.030   pH 7.0  5.0 - 8.0   Glucose, UA NEGATIVE NEGATIVE mg/dL   Hgb urine dipstick NEGATIVE NEGATIVE   Bilirubin Urine NEGATIVE NEGATIVE   Ketones, ur NEGATIVE NEGATIVE mg/dL   Protein, ur NEGATIVE NEGATIVE mg/dL   Urobilinogen, UA 0.2 0.0 - 1.0 mg/dL   Nitrite NEGATIVE NEGATIVE   Leukocytes, UA SMALL (A) NEGATIVE  Urine microscopic-add on     Status: Abnormal   Collection Time: 07/02/14  4:46 AM  Result Value Ref Range   Squamous Epithelial / LPF MANY (A) RARE   WBC,  UA 0-2 <3 WBC/hpf   RBC / HPF 0-2 <3 RBC/hpf   Bacteria, UA MANY (A) RARE    6:33 AM Nausea improved after IV Benadryl and Reglan. Fetal heart tones in the high 130s  Wynetta Fines, MD 07/02/14 [phone]

## 2014-07-04 ENCOUNTER — Emergency Department (HOSPITAL_COMMUNITY)
Admission: EM | Admit: 2014-07-04 | Discharge: 2014-07-04 | Disposition: A | Discharge status: Home / Self Care | Payer: Medicaid Other | Attending: Emergency Medicine | Admitting: Emergency Medicine

## 2014-07-04 ENCOUNTER — Encounter (HOSPITAL_COMMUNITY): Payer: Self-pay | Admitting: *Deleted

## 2014-07-04 DIAGNOSIS — X12XXXA Contact with other hot fluids, initial encounter: Secondary | ICD-10-CM | POA: Insufficient documentation

## 2014-07-04 DIAGNOSIS — Y9389 Activity, other specified: Secondary | ICD-10-CM | POA: Insufficient documentation

## 2014-07-04 DIAGNOSIS — O99353 Diseases of the nervous system complicating pregnancy, third trimester: Secondary | ICD-10-CM | POA: Insufficient documentation

## 2014-07-04 DIAGNOSIS — O9A213 Injury, poisoning and certain other consequences of external causes complicating pregnancy, third trimester: Secondary | ICD-10-CM | POA: Diagnosis present

## 2014-07-04 DIAGNOSIS — Z3A36 36 weeks gestation of pregnancy: Secondary | ICD-10-CM | POA: Insufficient documentation

## 2014-07-04 DIAGNOSIS — Y9289 Other specified places as the place of occurrence of the external cause: Secondary | ICD-10-CM | POA: Diagnosis not present

## 2014-07-04 DIAGNOSIS — Z792 Long term (current) use of antibiotics: Secondary | ICD-10-CM | POA: Diagnosis not present

## 2014-07-04 DIAGNOSIS — Z79899 Other long term (current) drug therapy: Secondary | ICD-10-CM | POA: Diagnosis not present

## 2014-07-04 DIAGNOSIS — T25221A Burn of second degree of right foot, initial encounter: Secondary | ICD-10-CM

## 2014-07-04 DIAGNOSIS — Z8619 Personal history of other infectious and parasitic diseases: Secondary | ICD-10-CM | POA: Diagnosis not present

## 2014-07-04 DIAGNOSIS — Y998 Other external cause status: Secondary | ICD-10-CM | POA: Insufficient documentation

## 2014-07-04 DIAGNOSIS — G43109 Migraine with aura, not intractable, without status migrainosus: Secondary | ICD-10-CM | POA: Diagnosis not present

## 2014-07-04 MED ORDER — BACITRACIN ZINC 500 UNIT/GM EX OINT
1.0000 "application " | TOPICAL_OINTMENT | Freq: Two times a day (BID) | CUTANEOUS | Status: DC
  Administered 2014-07-04: 2 via TOPICAL
  Filled 2014-07-04: qty 0.9

## 2014-07-04 MED ORDER — HYDROCODONE-ACETAMINOPHEN 5-325 MG PO TABS: ORAL_TABLET | ORAL | Status: DC

## 2014-07-04 MED ORDER — HYDROCODONE-ACETAMINOPHEN 5-325 MG PO TABS
1.0000 | ORAL_TABLET | Freq: Once | ORAL | Status: AC
  Administered 2014-07-04: 1 via ORAL
  Filled 2014-07-04: qty 1

## 2014-07-04 NOTE — Discharge Instructions (Signed)
Burn Care Your skin is a natural barrier to infection. It is the largest organ of your body. Burns damage this natural protection. To help prevent infection, it is very important to follow your caregiver's instructions in the care of your burn. Burns are classified as:  First degree. There is only redness of the skin (erythema). No scarring is expected.  Second degree. There is blistering of the skin. Scarring may occur with deeper burns.  Third degree. All layers of the skin are injured, and scarring is expected. HOME CARE INSTRUCTIONS   Wash your hands well before changing your bandage.  Change your bandage as often as directed by your caregiver.  Remove the old bandage. If the bandage sticks, you may soak it off with cool, clean water.  Cleanse the burn thoroughly but gently with mild soap and water.  Pat the area dry with a clean, dry cloth.  Apply a thin layer of antibacterial cream to the burn.  Apply a clean bandage as instructed by your caregiver.  Keep the bandage as clean and dry as possible.  Elevate the affected area for the first 24 hours, then as instructed by your caregiver.  Only take over-the-counter or prescription medicines for pain, discomfort, or fever as directed by your caregiver. SEEK IMMEDIATE MEDICAL CARE IF:   You develop excessive pain.  You develop redness, tenderness, swelling, or red streaks near the burn.  The burned area develops yellowish-white fluid (pus) or a bad smell.  You have a fever. MAKE SURE YOU:   Understand these instructions.  Will watch your condition.  Will get help right away if you are not doing well or get worse. Document Released: 07/13/2005 Document Revised: 10/05/2011 Document Reviewed: 12/03/2010 Exit[employer] Patient Information 2015 [employer]. This information is not intended to replace advice given to you by your health care provider. Make sure you discuss any questions you have with your health care  provider.  
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
## 2014-07-06 ENCOUNTER — Encounter (HOSPITAL_COMMUNITY): Payer: Self-pay | Admitting: Emergency Medicine

## 2014-07-06 ENCOUNTER — Emergency Department (HOSPITAL_COMMUNITY)
Admission: EM | Admit: 2014-07-06 | Discharge: 2014-07-06 | Disposition: A | Discharge status: Home / Self Care | Payer: Medicaid Other | Attending: Emergency Medicine | Admitting: Emergency Medicine

## 2014-07-06 DIAGNOSIS — Z8679 Personal history of other diseases of the circulatory system: Secondary | ICD-10-CM | POA: Diagnosis not present

## 2014-07-06 DIAGNOSIS — Z8619 Personal history of other infectious and parasitic diseases: Secondary | ICD-10-CM | POA: Diagnosis not present

## 2014-07-06 DIAGNOSIS — Z09 Encounter for follow-up examination after completed treatment for conditions other than malignant neoplasm: Secondary | ICD-10-CM

## 2014-07-06 DIAGNOSIS — Z4800 Encounter for change or removal of nonsurgical wound dressing: Secondary | ICD-10-CM | POA: Insufficient documentation

## 2014-07-06 DIAGNOSIS — Z3A36 36 weeks gestation of pregnancy: Secondary | ICD-10-CM | POA: Diagnosis not present

## 2014-07-06 DIAGNOSIS — X118XXD Contact with other hot tap-water, subsequent encounter: Secondary | ICD-10-CM | POA: Insufficient documentation

## 2014-07-06 DIAGNOSIS — T24231D Burn of second degree of right lower leg, subsequent encounter: Secondary | ICD-10-CM | POA: Insufficient documentation

## 2014-07-06 DIAGNOSIS — O9A213 Injury, poisoning and certain other consequences of external causes complicating pregnancy, third trimester: Secondary | ICD-10-CM | POA: Diagnosis not present
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
## 2014-07-09 ENCOUNTER — Encounter (HOSPITAL_COMMUNITY): Payer: Self-pay | Admitting: *Deleted

## 2014-07-09 ENCOUNTER — Ambulatory Visit (INDEPENDENT_AMBULATORY_CARE_PROVIDER_SITE_OTHER): Payer: Medicaid Other | Admitting: Women's Health

## 2014-07-09 ENCOUNTER — Inpatient Hospital Stay (HOSPITAL_COMMUNITY)
Admission: AD | Admit: 2014-07-09 | Discharge: 2014-07-12 | DRG: 775 | Disposition: A | Discharge status: Home / Self Care | Payer: Medicaid Other | Source: Ambulatory Visit | Attending: Family Medicine | Admitting: Family Medicine

## 2014-07-09 ENCOUNTER — Encounter: Payer: Self-pay | Admitting: Women's Health

## 2014-07-09 VITALS — BP 102/62 | Wt 158.0 lb

## 2014-07-09 DIAGNOSIS — Z118 Encounter for screening for other infectious and parasitic diseases: Secondary | ICD-10-CM

## 2014-07-09 DIAGNOSIS — T25221D Burn of second degree of right foot, subsequent encounter: Secondary | ICD-10-CM

## 2014-07-09 DIAGNOSIS — Z1389 Encounter for screening for other disorder: Secondary | ICD-10-CM

## 2014-07-09 DIAGNOSIS — Z3A36 36 weeks gestation of pregnancy: Secondary | ICD-10-CM | POA: Diagnosis present

## 2014-07-09 DIAGNOSIS — Z3483 Encounter for supervision of other normal pregnancy, third trimester: Secondary | ICD-10-CM

## 2014-07-09 DIAGNOSIS — T25229A Burn of second degree of unspecified foot, initial encounter: Secondary | ICD-10-CM | POA: Insufficient documentation

## 2014-07-09 DIAGNOSIS — Z1159 Encounter for screening for other viral diseases: Secondary | ICD-10-CM

## 2014-07-09 DIAGNOSIS — IMO0001 Reserved for inherently not codable concepts without codable children: Secondary | ICD-10-CM

## 2014-07-09 DIAGNOSIS — Z3685 Encounter for antenatal screening for Streptococcus B: Secondary | ICD-10-CM

## 2014-07-09 DIAGNOSIS — Z331 Pregnant state, incidental: Secondary | ICD-10-CM

## 2014-07-09 LAB — POCT URINALYSIS DIPSTICK
GLUCOSE UA: NEGATIVE
KETONES UA: NEGATIVE
Nitrite, UA: NEGATIVE
RBC UA: NEGATIVE

## 2014-07-09 MED ORDER — SILVER SULFADIAZINE 1 % EX CREA: 1.0000 "application " | TOPICAL_CREAM | Freq: Every day | CUTANEOUS | Status: DC

## 2014-07-09 NOTE — Progress Notes (Signed)
recheck in one hour
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
## 2014-07-10 ENCOUNTER — Inpatient Hospital Stay (HOSPITAL_COMMUNITY): Payer: Medicaid Other | Admitting: Anesthesiology

## 2014-07-10 ENCOUNTER — Encounter (HOSPITAL_COMMUNITY): Payer: Self-pay | Admitting: *Deleted

## 2014-07-10 ENCOUNTER — Ambulatory Visit (HOSPITAL_COMMUNITY): Payer: Medicaid Other | Admitting: Physical Therapy

## 2014-07-10 DIAGNOSIS — IMO0001 Reserved for inherently not codable concepts without codable children: Secondary | ICD-10-CM

## 2014-07-10 DIAGNOSIS — Z3A36 36 weeks gestation of pregnancy: Secondary | ICD-10-CM

## 2014-07-10 LAB — GC/CHLAMYDIA PROBE AMP
CT Probe RNA: NEGATIVE
GC PROBE AMP APTIMA: NEGATIVE

## 2014-07-10 LAB — CBC
HCT: 30.9 % — ABNORMAL LOW (ref 36.0–46.0)
Hemoglobin: 10.3 g/dL — ABNORMAL LOW (ref 12.0–15.0)
MCH: 27.8 pg (ref 26.0–34.0)
MCHC: 33.3 g/dL (ref 30.0–36.0)
MCV: 83.5 fL (ref 78.0–100.0)
PLATELETS: 223 10*3/uL (ref 150–400)
RBC: 3.7 MIL/uL — ABNORMAL LOW (ref 3.87–5.11)
RDW: 14.1 % (ref 11.5–15.5)
WBC: 9.9 10*3/uL (ref 4.0–10.5)

## 2014-07-10 LAB — ABO/RH: ABO/RH(D): AB POS

## 2014-07-10 LAB — TYPE AND SCREEN
ABO/RH(D): AB POS
ANTIBODY SCREEN: NEGATIVE

## 2014-07-10 LAB — GROUP B STREP BY PCR: GROUP B STREP BY PCR: NEGATIVE

## 2014-07-10 LAB — RAPID URINE DRUG SCREEN, HOSP PERFORMED
Amphetamines: NOT DETECTED
BARBITURATES: NOT DETECTED
BENZODIAZEPINES: NOT DETECTED
COCAINE: NOT DETECTED
OPIATES: NOT DETECTED
TETRAHYDROCANNABINOL: NOT DETECTED

## 2014-07-10 LAB — RPR

## 2014-07-10 LAB — OB RESULTS CONSOLE GBS: STREP GROUP B AG: NEGATIVE

## 2014-07-10 MED ORDER — BENZOCAINE-MENTHOL 20-0.5 % EX AERO: 1.0000 "application " | INHALATION_SPRAY | CUTANEOUS | Status: DC | PRN

## 2014-07-10 MED ORDER — ONDANSETRON HCL 4 MG/2ML IJ SOLN: 4.0000 mg | INTRAMUSCULAR | Status: DC | PRN

## 2014-07-10 MED ORDER — WITCH HAZEL-GLYCERIN EX PADS: 1.0000 "application " | MEDICATED_PAD | CUTANEOUS | Status: DC | PRN

## 2014-07-10 MED ORDER — ONDANSETRON HCL 4 MG/2ML IJ SOLN
4.0000 mg | Freq: Four times a day (QID) | INTRAMUSCULAR | Status: DC | PRN
  Administered 2014-07-10: 4 mg via INTRAVENOUS
  Filled 2014-07-10: qty 2

## 2014-07-10 MED ORDER — OXYTOCIN 40 UNITS IN LACTATED RINGERS INFUSION - SIMPLE MED
1.0000 m[IU]/min | INTRAVENOUS | Status: DC
  Administered 2014-07-10: 2 m[IU]/min via INTRAVENOUS

## 2014-07-10 MED ORDER — SILVER SULFADIAZINE 1 % EX CREA
TOPICAL_CREAM | Freq: Two times a day (BID) | CUTANEOUS | Status: DC
  Administered 2014-07-10 – 2014-07-11 (×3): via TOPICAL
  Filled 2014-07-10: qty 85

## 2014-07-10 MED ORDER — FENTANYL 2.5 MCG/ML BUPIVACAINE 1/10 % EPIDURAL INFUSION (WH - ANES)
INTRAMUSCULAR | Status: AC
  Administered 2014-07-10: 04:00:00
  Filled 2014-07-10: qty 125

## 2014-07-10 MED ORDER — OXYCODONE-ACETAMINOPHEN 5-325 MG PO TABS: 2.0000 | ORAL_TABLET | ORAL | Status: DC | PRN

## 2014-07-10 MED ORDER — FENTANYL 2.5 MCG/ML BUPIVACAINE 1/10 % EPIDURAL INFUSION (WH - ANES)
INTRAMUSCULAR | Status: DC | PRN
  Administered 2014-07-10: 14 mL/h via EPIDURAL

## 2014-07-10 MED ORDER — DIPHENHYDRAMINE HCL 50 MG/ML IJ SOLN: 12.5000 mg | INTRAMUSCULAR | Status: DC | PRN

## 2014-07-10 MED ORDER — PRENATAL MULTIVITAMIN CH
1.0000 | ORAL_TABLET | Freq: Every day | ORAL | Status: DC
  Administered 2014-07-11: 1 via ORAL
  Filled 2014-07-10: qty 1

## 2014-07-10 MED ORDER — CITRIC ACID-SODIUM CITRATE 334-500 MG/5ML PO SOLN: 30.0000 mL | ORAL | Status: DC | PRN

## 2014-07-10 MED ORDER — DIBUCAINE 1 % RE OINT: 1.0000 "application " | TOPICAL_OINTMENT | RECTAL | Status: DC | PRN

## 2014-07-10 MED ORDER — ACETAMINOPHEN 325 MG PO TABS
650.0000 mg | ORAL_TABLET | ORAL | Status: DC | PRN
  Administered 2014-07-10: 650 mg via ORAL
  Filled 2014-07-10: qty 2

## 2014-07-10 MED ORDER — TETANUS-DIPHTH-ACELL PERTUSSIS 5-2.5-18.5 LF-MCG/0.5 IM SUSP: 0.5000 mL | Freq: Once | INTRAMUSCULAR | Status: DC

## 2014-07-10 MED ORDER — BACITRACIN 500 UNIT/GM EX OINT
1.0000 "application " | TOPICAL_OINTMENT | Freq: Two times a day (BID) | CUTANEOUS | Status: DC
  Administered 2014-07-10 – 2014-07-11 (×3): 1 via TOPICAL
  Filled 2014-07-10 (×8): qty 0.9

## 2014-07-10 MED ORDER — EPHEDRINE 5 MG/ML INJ
10.0000 mg | INTRAVENOUS | Status: DC | PRN
  Filled 2014-07-10: qty 2

## 2014-07-10 MED ORDER — SENNOSIDES-DOCUSATE SODIUM 8.6-50 MG PO TABS
2.0000 | ORAL_TABLET | ORAL | Status: DC
  Administered 2014-07-10 – 2014-07-11 (×2): 2 via ORAL
  Filled 2014-07-10: qty 1
  Filled 2014-07-10: qty 2

## 2014-07-10 MED ORDER — LACTATED RINGERS IV SOLN
INTRAVENOUS | Status: DC
  Administered 2014-07-10 (×2): via INTRAVENOUS

## 2014-07-10 MED ORDER — LACTATED RINGERS IV SOLN
500.0000 mL | Freq: Once | INTRAVENOUS | Status: AC
  Administered 2014-07-10: 500 mL via INTRAVENOUS

## 2014-07-10 MED ORDER — TERBUTALINE SULFATE 1 MG/ML IJ SOLN
0.2500 mg | Freq: Once | INTRAMUSCULAR | Status: AC | PRN
  Administered 2014-07-10: 0.25 mg via SUBCUTANEOUS
  Filled 2014-07-10: qty 1

## 2014-07-10 MED ORDER — IBUPROFEN 600 MG PO TABS
600.0000 mg | ORAL_TABLET | Freq: Four times a day (QID) | ORAL | Status: DC
  Administered 2014-07-10 – 2014-07-12 (×6): 600 mg via ORAL
  Filled 2014-07-10 (×7): qty 1

## 2014-07-10 MED ORDER — PENICILLIN G POTASSIUM 5000000 UNITS IJ SOLR
5.0000 10*6.[IU] | Freq: Once | INTRAVENOUS | Status: DC
  Filled 2014-07-10: qty 5

## 2014-07-10 MED ORDER — OXYTOCIN BOLUS FROM INFUSION: 500.0000 mL | INTRAVENOUS | Status: DC

## 2014-07-10 MED ORDER — PHENYLEPHRINE 40 MCG/ML (10ML) SYRINGE FOR IV PUSH (FOR BLOOD PRESSURE SUPPORT)
80.0000 ug | PREFILLED_SYRINGE | INTRAVENOUS | Status: DC | PRN
  Filled 2014-07-10: qty 2

## 2014-07-10 MED ORDER — DIPHENHYDRAMINE HCL 25 MG PO CAPS: 25.0000 mg | ORAL_CAPSULE | Freq: Four times a day (QID) | ORAL | Status: DC | PRN

## 2014-07-10 MED ORDER — PENICILLIN G POTASSIUM 5000000 UNITS IJ SOLR
2.5000 10*6.[IU] | INTRAVENOUS | Status: DC
  Filled 2014-07-10 (×3): qty 2.5

## 2014-07-10 MED ORDER — OXYTOCIN 40 UNITS IN LACTATED RINGERS INFUSION - SIMPLE MED
62.5000 mL/h | INTRAVENOUS | Status: DC
  Filled 2014-07-10: qty 1000

## 2014-07-10 MED ORDER — LIDOCAINE HCL (PF) 1 % IJ SOLN
INTRAMUSCULAR | Status: DC | PRN
  Administered 2014-07-10 (×2): 4 mL

## 2014-07-10 MED ORDER — ZOLPIDEM TARTRATE 5 MG PO TABS
5.0000 mg | ORAL_TABLET | Freq: Every evening | ORAL | Status: DC | PRN
Start: 2014-07-10 — End: 2014-07-12

## 2014-07-10 MED ORDER — LANOLIN HYDROUS EX OINT: TOPICAL_OINTMENT | CUTANEOUS | Status: DC | PRN

## 2014-07-10 MED ORDER — ONDANSETRON HCL 4 MG PO TABS: 4.0000 mg | ORAL_TABLET | ORAL | Status: DC | PRN

## 2014-07-10 MED ORDER — SILVER SULFADIAZINE 1 % EX CREA
1.0000 "application " | TOPICAL_CREAM | Freq: Every day | CUTANEOUS | Status: DC
  Administered 2014-07-10: 1 via TOPICAL
  Filled 2014-07-10: qty 85

## 2014-07-10 MED ORDER — FENTANYL 2.5 MCG/ML BUPIVACAINE 1/10 % EPIDURAL INFUSION (WH - ANES)
14.0000 mL/h | INTRAMUSCULAR | Status: DC | PRN
  Administered 2014-07-10: 14 mL/h via EPIDURAL
  Filled 2014-07-10: qty 125

## 2014-07-10 MED ORDER — FLEET ENEMA 7-19 GM/118ML RE ENEM: 1.0000 | ENEMA | RECTAL | Status: DC | PRN

## 2014-07-10 MED ORDER — LIDOCAINE HCL (PF) 1 % IJ SOLN
30.0000 mL | INTRAMUSCULAR | Status: DC | PRN
  Filled 2014-07-10: qty 30

## 2014-07-10 MED ORDER — SIMETHICONE 80 MG PO CHEW: 80.0000 mg | CHEWABLE_TABLET | ORAL | Status: DC | PRN

## 2014-07-10 MED ORDER — PHENYLEPHRINE 40 MCG/ML (10ML) SYRINGE FOR IV PUSH (FOR BLOOD PRESSURE SUPPORT)
PREFILLED_SYRINGE | INTRAVENOUS | Status: AC
  Filled 2014-07-10: qty 10

## 2014-07-10 MED ORDER — LACTATED RINGERS IV SOLN
500.0000 mL | INTRAVENOUS | Status: DC | PRN
  Administered 2014-07-10: 500 mL via INTRAVENOUS

## 2014-07-10 MED ORDER — OXYCODONE-ACETAMINOPHEN 5-325 MG PO TABS: 1.0000 | ORAL_TABLET | ORAL | Status: DC | PRN
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
## 2014-07-10 NOTE — Progress Notes (Signed)
Ruth Dunlap is a 18 y.o. G2P1001 at [redacted]w[redacted]d by ultrasound admitted for active labor  Subjective:  Pt doing well.  States she has had some back pressure earlier, but currently does not feel any pressure.   Objective: BP 98/46 mmHg  Pulse 101  Temp(Src) 97.8 F (36.6 C) (Oral)  Resp 18  Ht 5\' 4"  (1.626 m)  Wt 71.725 kg (158 lb 2 oz)  BMI 27.13 kg/m2  SpO2 100%  LMP 10/27/2013 I/O last 3 completed shifts: In: -  Out: 150 [Urine:150]    FHT:  FHR: 142 bpm, variability: moderate,  accelerations:  Present,  decelerations:  Absent UC:   irregular, every 3 minutes SVE:   Dilation: 10 Effacement (%): 90 Station: 0, +1 Exam by:: hk  Labs: Lab Results  Component Value Date   WBC 9.9 07/10/2014   HGB 10.3* 07/10/2014   HCT 30.9* 07/10/2014   MCV 83.5 07/10/2014   PLT 223 07/10/2014    Assessment / Plan: Spontaneous labor, progressing normally  Labor: Progressing normally Preeclampsia:  no signs or symptoms of toxicity Fetal Wellbeing:  Category I Pain Control:  Epidural I/D:  n/a Anticipated MOD:  NSVD  Remus Blake, MD 07/10/2014, 2:36 PM

## 2014-07-10 NOTE — H&P (Signed)
Ruth Dunlap is a 18 y.o. female G2P1001 at [redacted]w[redacted]d by LMP and 1st trimester [place] presenting for SOL.  PAtient reports contractions starting around 1830 tonight and they have been increasing in intensity and frequency since.    +FM, denies LOF, VB, vaginal discharge.  Cervix changed from 4/80 to 5/90/-1 in MAU over 1hr.  Sauk Broadnax 18 yo bm 5th  Dating By LMP and 1st trimester [place]  Pap <21  GC/CT Initial: -/+   POC -/-          36+wks:  Genetic Screen NT/IT: normal  CF screen negative  Anatomic [place] Normal female 'Jamir'  Flu vaccine Declined 11/2  Tdap Recommended ~ 28wks  Glucose Screen  2 hr  76/108/100  GBS   Feed Preference Breast and bottle  Contraception nexplanon  Circumcision Yes   Childbirth Classes Declined, went last pregnancy  Pediatrician [facility]   Maternal Medical History:  Reason for admission: Nausea.    OB History    Gravida Para Term Preterm AB TAB SAB Ectopic Multiple Living   2 1 1  0 0 0 0 0 0 1     Past Medical History  Diagnosis Date  . Migraine with aura   . Supervision of other normal pregnancy 01/03/2014  . Chlamydia    Past Surgical History  Procedure Laterality Date  . No past surgeries     Family History: family history includes Diabetes in her maternal grandmother; Hypertension in her maternal grandmother. Social History:  reports that she has never smoked. She has never used smokeless tobacco. She reports that she does not drink alcohol or use illicit drugs.   Review of Systems  Constitutional: Negative for fever, chills and weight loss.  Eyes: Negative for blurred vision.  Respiratory: Negative for cough and shortness of breath.   Cardiovascular: Negative for chest pain and leg swelling.  Gastrointestinal: Negative for nausea, vomiting and abdominal pain.  Genitourinary: Negative for dysuria, urgency and frequency.  Neurological: Negative for headaches.    Dilation: 5 Effacement (%):  90 Station: -1 Exam by:: B Mosca Blood pressure 107/56, pulse 93, temperature 98.6 F (37 C), temperature source Oral, resp. rate 18, height 5\' 4"  (1.626 m), weight 71.725 kg (158 lb 2 oz), last menstrual period 10/27/2013. Maternal Exam:  Uterine Assessment: Contraction strength is moderate.  Pelvis: adequate for delivery.      Fetal Exam Fetal Monitor Review: Baseline rate: 135.  Variability: moderate (6-25 bpm).   Pattern: accelerations present.    Fetal State Assessment: Category I - tracings are normal.     Physical Exam  Constitutional: She is oriented to person, place, and time. She appears well-developed and well-nourished. No distress.  HENT:  Head: Normocephalic and atraumatic.  Cardiovascular: Normal rate and intact distal pulses.   Respiratory: Effort normal. No respiratory distress.  GI: There is no tenderness.  Gravid  Musculoskeletal: She exhibits no edema or tenderness.  Neurological: She is alert and oriented to person, place, and time.  Skin: Skin is warm and dry.    Prenatal labs: ABO, Rh: AB/POS/-- (06/10 1432) Antibody: NEG (11/03 0932) Rubella: 1.13 (06/10 1432) RPR: NON REAC (11/03 0932)  HBsAg: NEGATIVE (06/10 1432)  HIV: NONREACTIVE (11/03 0932)  GBS:   pending  Assessment/Plan: Ruth Dunlap is a 18 y.o. G2P1001 at [redacted]w[redacted]d here for SOL.  #Labor: expectant management #Pain: Epidural when desired #FWB: Cat 1 #ID:  GBS pending, PCN ordered for ppx #MOF:  breast and bottle #MOC: Nexplanon #Circ:  Desires, undecided about IP vs OP    Lavon Paganini 07/10/2014, 12:43 AM    OB fellow attestation:  I have seen and examined this patient; I agree with above documentation in the resident's note.   Ruth Dunlap is a 18 y.o. G2P1001 reporting contractions and vaginal pressure. +FM, denies LOF, VB, vaginal discharge.  PE: BP 113/69 mmHg  Pulse 88  Temp(Src) 97.8 F (36.6 C) (Oral)  Resp 18  Ht 5\' 4"  (1.626 m)  Wt 158 lb 2 oz  (71.725 kg)  BMI 27.13 kg/m2  SpO2 98%  LMP 10/27/2013 Gen: calm comfortable, NAD Resp: normal effort, no distress Abd: gravid  ROS, labs, PMH reviewed NST reactive   Plan: # Labor, late preterm - Admit to labor and delivery for expectant management - Epidural on request.  - GBS pending, ppx ordered.   Shelbie Hutching, MD 5:30 AM

## 2014-07-10 NOTE — Progress Notes (Signed)
Ruth Dunlap is a 18 y.o. G2P1001 at [redacted]w[redacted]d by ultrasound admitted for active labor  Subjective:  Pt doing well, no pain currently.     Objective: BP 99/53 mmHg  Pulse 101  Temp(Src) 98 F (36.7 C) (Oral)  Resp 18  Ht 5\' 4"  (1.626 m)  Wt 71.725 kg (158 lb 2 oz)  BMI 27.13 kg/m2  SpO2 100%  LMP 10/27/2013 I/O last 3 completed shifts: In: -  Out: 150 [Urine:150]    FHT:  FHR: 142 bpm, variability: moderate,  accelerations:  Present,  decelerations:  Absent UC:   regular, every 3.5 minutes SVE:   Dilation: 6.5 Effacement (%): 80 Station: -1 Exam by:: dr. Deniece Ree  Labs: Lab Results  Component Value Date   WBC 9.9 07/10/2014   HGB 10.3* 07/10/2014   HCT 30.9* 07/10/2014   MCV 83.5 07/10/2014   PLT 223 07/10/2014    Assessment / Plan: Spontaneous labor, progressing normally, Ruptured membranes at 9:15am.    Labor: Progressing normally Preeclampsia:  no signs or symptoms of toxicity Fetal Wellbeing:  Category I Pain Control:  Epidural I/D:  n/a Anticipated MOD:  NSVD  Remus Blake, MD 07/10/2014, 9:42 AM
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
## 2014-07-10 NOTE — Anesthesia Procedure Notes (Signed)
Epidural Patient location during procedure: OB Start time: 07/10/2014 3:07 AM  Staffing Anesthesiologist: Jaymarion Trombly A. Performed by: anesthesiologist   Preanesthetic Checklist Completed: patient identified, site marked, surgical consent, pre-op evaluation, timeout performed, IV checked, risks and benefits discussed and monitors and equipment checked  Epidural Patient position: sitting Prep: site prepped and draped and DuraPrep Patient monitoring: continuous pulse ox and blood pressure Approach: midline Location: L3-L4 Injection technique: LOR air  Needle:  Needle type: Tuohy  Needle gauge: 17 G Needle length: 9 cm and 9 Needle insertion depth: 4 cm Catheter type: closed end flexible Catheter size: 19 Gauge Catheter at skin depth: 9 cm Test dose: negative and Other  Assessment Events: blood not aspirated, injection not painful, no injection resistance, negative IV test and no paresthesia  Additional Notes Patient identified. Risks and benefits discussed including failed block, incomplete  Pain control, post dural puncture headache, nerve damage, paralysis, blood pressure Changes, nausea, vomiting, reactions to medications-both toxic and allergic and post Partum back pain. All questions were answered. Patient expressed understanding and wished to proceed. Sterile technique was used throughout procedure. Epidural site was Dressed with sterile barrier dressing. No paresthesias, signs of intravascular injection Or signs of intrathecal spread were encountered.  Patient was more comfortable after the epidural was dosed. Please see RN's note for documentation of vital signs and FHR which are stable.

## 2014-07-10 NOTE — Progress Notes (Signed)
LABOR PROGRESS NOTE  Ruth Dunlap is a 18 y.o. G2P1001 at [redacted]w[redacted]d  admitted for active labor at term  Subjective: Comfortable with epidural.   Objective: BP 113/69 mmHg  Pulse 88  Temp(Src) 97.8 F (36.6 C) (Oral)  Resp 18  Ht 5\' 4"  (1.626 m)  Wt 158 lb 2 oz (71.725 kg)  BMI 27.13 kg/m2  SpO2 98%  LMP 10/27/2013 or  Filed Vitals:   07/10/14 0325 07/10/14 0330 07/10/14 0400 07/10/14 0430  BP: 113/60 111/63 113/60 113/69  Pulse: 104 96 88 88  Temp:   97.8 F (36.6 C)   TempSrc:   Oral   Resp: 18  18 18   Height:      Weight:      SpO2: 99% 98%      Total I/O In: -  Out: 150 [Urine:150]  FHT:  FHR: 135 bpm, variability: moderate,  accelerations:  Present,  decelerations:  Present prolonged deceleration to the 90s after starting pitocin UC:   irregular, every 2-7 minutes SVE:   Dilation: 5 Effacement (%): 80 Station: 0 Exam by:: Ruth Jividen MD  Dilation: 5 Effacement (%): 80 Cervical Position: Middle Station: 0 Presentation: Vertex Exam by:: Ruth Flaim MD  Pitocin OFF  Labs: Lab Results  Component Value Date   WBC 9.9 07/10/2014   HGB 10.3* 07/10/2014   HCT 30.9* 07/10/2014   MCV 83.5 07/10/2014   PLT 223 07/10/2014    Assessment / Plan: Spontaneous labor, early active labor  Labor: Contractions spaced after epidural placement. Started pitocin, but after a prolonged contraction baby had a prolonged deceleration. Resolved with oxygen, maternal repositining and terbutaline. Pitocin off. Will consider restarting vs AROM if no cervical change.  Fetal Wellbeing:  Category II Pain Control:  Epidural Anticipated MOD:  NSVD  Shelbie Hutching, MD 07/10/2014, 5:18 AM

## 2014-07-11 LAB — CBC
HCT: 27.8 % — ABNORMAL LOW (ref 36.0–46.0)
HEMOGLOBIN: 9.2 g/dL — AB (ref 12.0–15.0)
MCH: 27.6 pg (ref 26.0–34.0)
MCHC: 33.1 g/dL (ref 30.0–36.0)
MCV: 83.5 fL (ref 78.0–100.0)
Platelets: 195 10*3/uL (ref 150–400)
RBC: 3.33 MIL/uL — ABNORMAL LOW (ref 3.87–5.11)
RDW: 13.9 % (ref 11.5–15.5)
WBC: 11.2 10*3/uL — ABNORMAL HIGH (ref 4.0–10.5)

## 2014-07-11 LAB — STREP B DNA PROBE: GBSP: DETECTED

## 2014-07-11 NOTE — Progress Notes (Signed)
I received a referral from pt's RN as pt appears hesitant to visit her baby in the NICU.  Pt was tearful during our visit and reports that she has a really hard time seeing her baby with "all those tubes and wires."  Her SO has been down to see their baby, but she has not been down yet today.  Her cousin plans to visit soon and she reports that she will feel more comfortable going with her cousin to see the baby.  She is lamenting not having the chance to hold her baby and also relayed her birth story including that her baby's heart rate decreased.  This deceleration scared her and was potentially traumatizing as well.  She seems to still be in shock over all that happened in quick succession.  Please page [place] tomorrow if she continues to show hesitancy or shows other signs of trauma.  [phone].  Chaplain Janne Napoleon 1:33 PM    07/11/14 1300  Clinical Encounter Type  Visited With Patient  Visit Type Spiritual support  Referral From Nurse  Spiritual Encounters  Spiritual Needs Emotional  Stress Factors  Patient Stress Factors Loss of control

## 2014-07-11 NOTE — Progress Notes (Signed)
Post Partum Day 1 Subjective: no complaints, up ad lib, voiding and tolerating PO  Objective: Blood pressure 108/43, pulse 71, temperature 98.6 F (37 C), temperature source Oral, resp. rate 18, height 5\' 4"  (1.626 m), weight 158 lb (71.668 kg), last menstrual period 10/27/2013, SpO2 100 %, unknown if currently breastfeeding.  Physical Exam:  General: alert, cooperative and no distress Lochia: appropriate Uterine Fundus: firm DVT Evaluation: No evidence of DVT seen on physical exam. Negative Homan's sign. No cords or calf tenderness. No significant calf/ankle edema.   Recent Labs  07/10/14 0100 07/11/14 0538  HGB 10.3* 9.2*  HCT 30.9* 27.8*    Assessment/Plan: Plan for discharge tomorrow and Breastfeeding   LOS: 2 days   STINSON, JACOB JEHIEL 07/11/2014, 9:40 AM

## 2014-07-11 NOTE — Lactation Note (Signed)
This note was copied from the chart of Ruth Brianda Heyne. Lactation Consultation Note  Initial visit made.  Breastfeeding consultation services and support information given to patient.  Mom has Providing Breastmilk for Your Baby in NICU booklet at bedside.  Mom has initiated pumping.  Reviewed pumping every 3 hours x 15 minutes followed by hand expression.  Mom denies questions or concerns.  Encouraged mom to call [facility] for pump loaner.  Patient Name: Ruth Dunlap'S Date: 07/11/2014 Reason for consult: Initial assessment;Late preterm infant;NICU baby   Maternal Data    Feeding    LATCH Score/Interventions                      Lactation Tools Discussed/Used WIC Program: Yes Pump Review: Setup, frequency, and cleaning;Milk Storage Initiated by:: RN Date initiated:: 07/10/14   Consult Status      Carla Drape S 07/11/2014, 11:37 AM

## 2014-07-11 NOTE — Anesthesia Postprocedure Evaluation (Signed)
Anesthesia Post Note  Patient: Ruth Dunlap  Procedure(s) Performed: * No procedures listed *  Anesthesia type: Epidural  Patient location: Women's unit  Post pain: Pain level controlled  Post assessment: Post-op Vital signs reviewed  Last Vitals:  Filed Vitals:   07/11/14 0100  BP: 108/43  Pulse: 71  Temp: 37 C  Resp:     Post vital signs: Reviewed  Level of consciousness:alert  Complications: No apparent anesthesia complications

## 2014-07-12 MED ORDER — IBUPROFEN 600 MG PO TABS: 600.0000 mg | ORAL_TABLET | Freq: Four times a day (QID) | ORAL | Status: DC

## 2014-07-12 NOTE — Progress Notes (Signed)
Pt discharged home with mother... Discharge instructions reviewed with pt and she verbalized understanding... Offered to change pt's foot dressing prior to discharge and pt verbalized that she will change it herself when she gets home... Condition stable... No equipment... Ambulated to car C. Ovid Curd, [place].

## 2014-07-12 NOTE — Discharge Instructions (Signed)

## 2014-07-12 NOTE — Discharge Summary (Signed)
Obstetric Discharge Summary Reason for Admission: onset of labor Prenatal Procedures: ultrasound Intrapartum Procedures: spontaneous vaginal delivery Postpartum Procedures: none Complications-Operative and Postpartum: none HEMOGLOBIN  Date Value Ref Range Status  07/11/2014 9.2* 12.0 - 15.0 g/dL Final   HCT  Date Value Ref Range Status  07/11/2014 27.8* 36.0 - 46.0 % Final    Physical Exam:  General: alert, cooperative and no distress Lochia: appropriate Uterine Fundus: firm Incision: na DVT Evaluation: No evidence of DVT seen on physical exam.  Discharge Diagnoses: Premature labor and delivered  Discharge Information: Date: 07/12/2014 Activity: pelvic rest Diet: routine Medications: Ibuprofen Condition: stable Instructions: refer to practice specific booklet Discharge to: home Follow-up Information    Follow up with Florian Buff, MD In 1 week.   Specialties:  Obstetrics and Gynecology, Radiology   Why:  to check foot   Contact information:   [place] [id number] [phone]       Newborn Data: Live born female  Birth Weight: 6 lb 2.8 oz (2800 g) APGAR: 7, 9  Home with staying in nursery due to eating/breathing issues.  Niurka Benecke H 07/12/2014, 7:33 AM

## 2014-07-16 ENCOUNTER — Encounter: Payer: Medicaid Other | Admitting: Women's Health

## 2014-07-18 ENCOUNTER — Ambulatory Visit (INDEPENDENT_AMBULATORY_CARE_PROVIDER_SITE_OTHER): Payer: Medicaid Other | Admitting: Obstetrics & Gynecology

## 2014-07-18 ENCOUNTER — Encounter: Payer: Self-pay | Admitting: Obstetrics & Gynecology

## 2014-07-18 VITALS — BP 120/80 | Wt 147.0 lb

## 2014-07-18 DIAGNOSIS — T25221S Burn of second degree of right foot, sequela: Secondary | ICD-10-CM | POA: Diagnosis not present

## 2014-07-18 MED ORDER — SILVER SULFADIAZINE 1 % EX CREA: 1.0000 "application " | TOPICAL_CREAM | Freq: Every day | CUTANEOUS | Status: DC
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
## 2014-08-01 ENCOUNTER — Ambulatory Visit (HOSPITAL_COMMUNITY): Payer: Medicaid Other | Attending: Emergency Medicine | Admitting: Physical Therapy

## 2014-08-15 ENCOUNTER — Encounter: Payer: Self-pay | Admitting: Women's Health

## 2014-08-15 ENCOUNTER — Ambulatory Visit (INDEPENDENT_AMBULATORY_CARE_PROVIDER_SITE_OTHER): Payer: Medicaid Other | Admitting: Women's Health

## 2014-08-15 DIAGNOSIS — O99345 Other mental disorders complicating the puerperium: Secondary | ICD-10-CM

## 2014-08-15 DIAGNOSIS — F53 Postpartum depression: Secondary | ICD-10-CM | POA: Insufficient documentation

## 2014-08-15 MED ORDER — MEDROXYPROGESTERONE ACETATE 150 MG/ML IM SUSP: 150.0000 mg | INTRAMUSCULAR | Status: DC

## 2014-08-15 MED ORDER — ESCITALOPRAM OXALATE 10 MG PO TABS: 10.0000 mg | ORAL_TABLET | Freq: Every day | ORAL | Status: DC

## 2014-08-15 NOTE — Progress Notes (Signed)
Patient ID: Ruth Dunlap, female   DOB: 09-04-1995, 19 y.o.   MRN: [id number] Subjective:    Ruth Dunlap is a 19 y.o. G82P1102 African American female who presents for a postpartum visit. She is 4 weeks postpartum following a spontaneous vaginal delivery at 36.4 gestational weeks after spontaneous labor. Anesthesia: epidural. I have fully reviewed the prenatal and intrapartum course. Postpartum course has been uncomplicated. Baby's course has been complicated by 1wk nicu stay d/t prematurity/respiratory distress. Baby is feeding by bottle. Bleeding bled x 2wks then stopped, restarted 1/14 w/ cramps- likely period. Bowel function is normal. Bladder function is normal. Patient is not sexually active. Last sexual activity: prior to birth of baby. Contraception method is none and would like depo-understands it may potentially make depression worse- would like to proceed w/ it anyway. Postpartum depression screening: negative. Score 9.  Feels depressed, not sleeping, able to eat ok, not finding joy in things she used to, cries all the time. No SI/HI/II. Discussed options, would like to try meds and counseling. Had same feeling after birth of last baby. Last pap <21yo.  The following portions of the patient's history were reviewed and updated as appropriate: allergies, current medications, past medical history, past surgical history and problem list.  Review of Systems Pertinent items are noted in HPI.   Filed Vitals:   08/15/14 1508  BP: 116/60  Height: 5\' 4"  (1.626 m)  Weight: 151 lb (68.493 kg)   Patient's last menstrual period was 08/09/2014.  Objective:   General:  alert, cooperative and no distress   Breasts:  deferred, no complaints  Lungs: clear to auscultation bilaterally  Heart:  regular rate and rhythm  Abdomen: soft, nontender   Vulva: normal  Vagina: normal vagina  Cervix:  closed  Corpus: Well-involuted  Adnexa:  Non-palpable  Rectal Exam: No hemorrhoids        Assessment:    Postpartum exam 4 wks s/p SVB @ 36.4wks d/t spontaneous labor Bottlefeeding Depression screening Contraception counseling   Plan:  Rx lexapro 10mg  daily, knows not to expect immediate results [facility] Contraception: abstinence and then depo Follow up in: 1 day for depo, then 4wks for f/u w/ me or earlier if needed  Tawnya Crook CNM, [facility] 08/15/2014 3:29 PM

## 2014-08-16 ENCOUNTER — Encounter: Payer: Self-pay | Admitting: *Deleted

## 2014-08-16 ENCOUNTER — Ambulatory Visit (INDEPENDENT_AMBULATORY_CARE_PROVIDER_SITE_OTHER): Payer: Medicaid Other | Admitting: *Deleted

## 2014-08-16 DIAGNOSIS — Z3202 Encounter for pregnancy test, result negative: Secondary | ICD-10-CM

## 2014-08-16 DIAGNOSIS — Z3042 Encounter for surveillance of injectable contraceptive: Secondary | ICD-10-CM

## 2014-08-16 DIAGNOSIS — Z3200 Encounter for pregnancy test, result unknown: Secondary | ICD-10-CM

## 2014-08-16 LAB — POCT URINE PREGNANCY: Preg Test, Ur: NEGATIVE

## 2014-08-16 MED ORDER — MEDROXYPROGESTERONE ACETATE 150 MG/ML IM SUSP
150.0000 mg | Freq: Once | INTRAMUSCULAR | Status: AC
  Administered 2014-08-16: 150 mg via INTRAMUSCULAR

## 2014-09-12 ENCOUNTER — Ambulatory Visit: Payer: Medicaid Other | Admitting: Women's Health

## 2014-10-02 ENCOUNTER — Emergency Department (HOSPITAL_COMMUNITY)
Admission: EM | Admit: 2014-10-02 | Discharge: 2014-10-02 | Disposition: A | Discharge status: Home / Self Care | Payer: Medicaid Other | Attending: Emergency Medicine | Admitting: Emergency Medicine

## 2014-10-02 ENCOUNTER — Encounter (HOSPITAL_COMMUNITY): Payer: Self-pay | Admitting: Emergency Medicine

## 2014-10-02 DIAGNOSIS — Y9389 Activity, other specified: Secondary | ICD-10-CM | POA: Diagnosis not present

## 2014-10-02 DIAGNOSIS — S0993XA Unspecified injury of face, initial encounter: Secondary | ICD-10-CM | POA: Insufficient documentation

## 2014-10-02 DIAGNOSIS — Z8619 Personal history of other infectious and parasitic diseases: Secondary | ICD-10-CM | POA: Diagnosis not present

## 2014-10-02 DIAGNOSIS — Y9289 Other specified places as the place of occurrence of the external cause: Secondary | ICD-10-CM | POA: Insufficient documentation

## 2014-10-02 DIAGNOSIS — Z79899 Other long term (current) drug therapy: Secondary | ICD-10-CM | POA: Diagnosis not present

## 2014-10-02 DIAGNOSIS — R519 Headache, unspecified: Secondary | ICD-10-CM

## 2014-10-02 DIAGNOSIS — Z8679 Personal history of other diseases of the circulatory system: Secondary | ICD-10-CM | POA: Diagnosis not present

## 2014-10-02 DIAGNOSIS — R51 Headache: Secondary | ICD-10-CM

## 2014-10-02 DIAGNOSIS — Y998 Other external cause status: Secondary | ICD-10-CM | POA: Insufficient documentation

## 2014-10-02 MED ORDER — OXYCODONE-ACETAMINOPHEN 5-325 MG PO TABS
2.0000 | ORAL_TABLET | Freq: Once | ORAL | Status: AC
  Administered 2014-10-02: 2 via ORAL
  Filled 2014-10-02: qty 2

## 2014-10-02 MED ORDER — OXYCODONE-ACETAMINOPHEN 5-325 MG PO TABS: 1.0000 | ORAL_TABLET | ORAL | Status: DC | PRN

## 2014-10-02 MED ORDER — IBUPROFEN 400 MG PO TABS
600.0000 mg | ORAL_TABLET | Freq: Once | ORAL | Status: AC
  Administered 2014-10-02: 600 mg via ORAL
  Filled 2014-10-02: qty 2
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
## 2014-11-08 ENCOUNTER — Ambulatory Visit: Payer: Medicaid Other

## 2014-11-12 ENCOUNTER — Encounter: Payer: Self-pay | Admitting: *Deleted

## 2014-11-12 ENCOUNTER — Ambulatory Visit (INDEPENDENT_AMBULATORY_CARE_PROVIDER_SITE_OTHER): Payer: Medicaid Other | Admitting: *Deleted

## 2014-11-12 ENCOUNTER — Ambulatory Visit: Payer: Medicaid Other

## 2014-11-12 DIAGNOSIS — Z3042 Encounter for surveillance of injectable contraceptive: Secondary | ICD-10-CM | POA: Diagnosis not present

## 2014-11-12 DIAGNOSIS — Z3202 Encounter for pregnancy test, result negative: Secondary | ICD-10-CM | POA: Diagnosis not present

## 2014-11-12 LAB — POCT URINE PREGNANCY: Preg Test, Ur: NEGATIVE

## 2014-11-12 MED ORDER — MEDROXYPROGESTERONE ACETATE 150 MG/ML IM SUSP
150.0000 mg | Freq: Once | INTRAMUSCULAR | Status: AC
  Administered 2014-11-12: 150 mg via INTRAMUSCULAR

## 2014-11-12 NOTE — Progress Notes (Signed)
Pt here for Depo. Reports no problems at this time. Return in 12 weeks for next shot. JSY 

## 2014-11-14 ENCOUNTER — Ambulatory Visit: Payer: Medicaid Other | Admitting: Advanced Practice Midwife

## 2014-11-14 ENCOUNTER — Encounter: Payer: Self-pay | Admitting: *Deleted

## 2014-11-21 ENCOUNTER — Emergency Department (HOSPITAL_COMMUNITY)
Admission: EM | Admit: 2014-11-21 | Discharge: 2014-11-21 | Disposition: A | Discharge status: Home / Self Care | Payer: Medicaid Other | Attending: Emergency Medicine | Admitting: Emergency Medicine

## 2014-11-21 ENCOUNTER — Encounter (HOSPITAL_COMMUNITY): Payer: Self-pay | Admitting: Emergency Medicine

## 2014-11-21 ENCOUNTER — Emergency Department (HOSPITAL_COMMUNITY): Payer: Medicaid Other

## 2014-11-21 DIAGNOSIS — R0789 Other chest pain: Secondary | ICD-10-CM | POA: Insufficient documentation

## 2014-11-21 DIAGNOSIS — Z8619 Personal history of other infectious and parasitic diseases: Secondary | ICD-10-CM | POA: Insufficient documentation

## 2014-11-21 DIAGNOSIS — Z8679 Personal history of other diseases of the circulatory system: Secondary | ICD-10-CM | POA: Insufficient documentation

## 2014-11-21 DIAGNOSIS — Z792 Long term (current) use of antibiotics: Secondary | ICD-10-CM | POA: Diagnosis not present

## 2014-11-21 DIAGNOSIS — R079 Chest pain, unspecified: Secondary | ICD-10-CM | POA: Diagnosis present

## 2014-11-21 LAB — I-STAT CHEM 8, ED
BUN: 11 mg/dL (ref 6–23)
CALCIUM ION: 1.28 mmol/L — AB (ref 1.12–1.23)
Chloride: 107 mmol/L (ref 96–112)
Creatinine, Ser: 0.7 mg/dL (ref 0.50–1.10)
Glucose, Bld: 95 mg/dL (ref 70–99)
HCT: 40 % (ref 36.0–46.0)
Hemoglobin: 13.6 g/dL (ref 12.0–15.0)
Potassium: 3.8 mmol/L (ref 3.5–5.1)
Sodium: 142 mmol/L (ref 135–145)
TCO2: 26 mmol/L (ref 0–100)

## 2014-11-21 LAB — I-STAT TROPONIN, ED: TROPONIN I, POC: 0 ng/mL (ref 0.00–0.08)

## 2014-11-21 MED ORDER — ACETAMINOPHEN 325 MG PO TABS
650.0000 mg | ORAL_TABLET | Freq: Once | ORAL | Status: AC
  Administered 2014-11-21: 650 mg via ORAL
  Filled 2014-11-21: qty 2

## 2014-11-21 NOTE — ED Notes (Signed)
Patient c/o intermittent sharp chest pain x 1 week.

## 2014-11-21 NOTE — ED Notes (Signed)
Patient verbalizes understanding of discharge instructions, home care and follow up care if needed. Patient ambulatory out of department at this time with family.

## 2014-11-21 NOTE — Discharge Instructions (Signed)
If you were given medicines take as directed.  If you are on coumadin or contraceptives realize their levels and effectiveness is altered by many different medicines.  If you have any reaction (rash, tongues swelling, other) to the medicines stop taking and see a physician.   Please follow up as directed and return to the ER or see a physician for new or worsening symptoms.  Thank you. Filed Vitals:   11/21/14 1944  BP: 118/76  Pulse: 83  Temp: 98.7 F (37.1 C)  TempSrc: Oral  Resp: 18  Height: 5\' 6"  (1.676 m)  Weight: 155 lb (70.308 kg)  SpO2: 100%

## 2014-11-21 NOTE — ED Provider Notes (Signed)
CSN: [id number]     Arrival date & time 11/21/14  1928 History  This chart was scribed for Elnora Morrison, MD by Tula Nakayama, ED Scribe. This patient was seen in room APA03/APA03 and the patient's care was started at 8:46 PM.    Chief Complaint  Patient presents with  . Chest Pain   The history is provided by the patient. No language interpreter was used.    HPI Comments: Ruth Dunlap is a 19 y.o. female who presents to the Emergency Department complaining of intermittent, moderate, nonradiating, sharp central CP that started 1 week ago. She has not identified any triggers for her pain. Pt denies a history of cardiac factors, family history of CAD, tobacco use, drug use, recent surgeries, recent long travel, PE/DVT and estrogen use. She is on the Depo-provera shot. Pt denies cough, diaphoresis and leg swelling as associated symptoms.  Past Medical History  Diagnosis Date  . Migraine with aura   . Supervision of other normal pregnancy 01/03/2014  . Chlamydia    Past Surgical History  Procedure Laterality Date  . No past surgeries     Family History  Problem Relation Age of Onset  . Diabetes Maternal Grandmother   . Hypertension Maternal Grandmother    History  Substance Use Topics  . Smoking status: Never Smoker   . Smokeless tobacco: Never Used  . Alcohol Use: No   OB History    Gravida Para Term Preterm AB TAB SAB Ectopic Multiple Living   2 2 1 1  0 0 0 0 0 2     Review of Systems  Constitutional: Negative for diaphoresis.  Respiratory: Negative for cough and shortness of breath.   Cardiovascular: Positive for chest pain. Negative for leg swelling.  All other systems reviewed and are negative.   Allergies  Review of patient's allergies indicates no known allergies.  Home Medications   Prior to Admission medications   Medication Sig Start Date End Date Taking? Authorizing Provider  medroxyPROGESTERone (DEPO-PROVERA) 150 MG/ML injection Inject 1 mL (150 mg  total) into the muscle every 3 (three) months. 08/15/14  Yes Roma Schanz, CNM   BP 111/58 mmHg  Pulse 71  Temp(Src) 98.4 F (36.9 C) (Oral)  Resp 18  Ht 5\' 6"  (1.676 m)  Wt 155 lb (70.308 kg)  BMI 25.03 kg/m2  SpO2 99% Physical Exam  Constitutional: She appears well-developed and well-nourished. No distress.  HENT:  Head: Normocephalic and atraumatic.  Mild dry mucous membranes  Eyes: Conjunctivae and EOM are normal. Pupils are equal, round, and reactive to light.  Neck: Neck supple. No tracheal deviation present.  Cardiovascular: Normal rate, regular rhythm and normal heart sounds.   No murmur heard. Pulmonary/Chest: Effort normal and breath sounds normal. No respiratory distress. She has no wheezes.  Abdominal: Soft. There is no tenderness.  No peritonitis  Musculoskeletal: She exhibits no edema.  Pulses 2+ equal in bilateral UE; no calf pain, no leg swelling  Skin: Skin is warm and dry.  Psychiatric: She has a normal mood and affect. Her behavior is normal.  Nursing note and vitals reviewed.   ED Course  Procedures   DIAGNOSTIC STUDIES: Oxygen Saturation is 100% on RA, normal by my interpretation.    COORDINATION OF CARE: 8:51 PM Discussed treatment plan with pt at bedside and pt agreed to plan.   Labs Review Labs Reviewed  I-STAT CHEM 8, ED - Abnormal; Notable for the following:    Calcium, Ion 1.28 (*)  All other components within normal limits  I-STAT CHEM 8, ED  I-STAT TROPOININ, ED    Imaging Review Dg Chest 2 View  11/21/2014   CLINICAL DATA:  19 year old female with a history of chest pain  EXAM: CHEST - 2 VIEW  COMPARISON:  None.  FINDINGS: Cardiomediastinal silhouette projects within normal limits in size and contour. No confluent airspace disease, pneumothorax, or pleural effusion.  No displaced fracture.  Unremarkable appearance of the upper abdomen.  IMPRESSION: No radiographic evidence of acute cardiopulmonary disease.  Signed,  Dulcy Fanny.  Earleen Newport, DO  [facility]  [facility]   Electronically Signed   By: Corrie Mckusick D.O.   On: 11/21/2014 20:23     EKG Interpretation   Date/Time:  Wednesday November 21 2014 19:53:24 EDT Ventricular Rate:  83 PR Interval:  123 QRS Duration: 83 QT Interval:  352 QTC Calculation: 414 R Axis:   75 Text Interpretation:  Sinus arrhythmia Baseline wander in lead(s) V3 V4  Confirmed by Dailey Buccheri  MD, Cainen Burnham ([id number]) on 11/21/2014 8:26:45 PM      MDM   Final diagnoses:  Atypical chest pain   I personally performed the services described in this documentation, which was scribed in my presence. The recorded information has been reviewed and is accurate.  Patient very low risk cardiac, very low risk blood clot. PE RC negative, normal vitals in the ER. EKG no acute findings, chest x-ray reviewed no acute findings, troponin and triaged labs unremarkable.  Results and differential diagnosis were discussed with the patient/parent/guardian. Close follow up outpatient was discussed, comfortable with the plan.   Medications - No data to display  Filed Vitals:   11/21/14 1944 11/21/14 2123  BP: 118/76 111/58  Pulse: 83 71  Temp: 98.7 F (37.1 C) 98.4 F (36.9 C)  TempSrc: Oral Oral  Resp: 18 18  Height: 5\' 6"  (1.676 m)   Weight: 155 lb (70.308 kg)   SpO2: 100% 99%    Final diagnoses:  Atypical chest pain      Elnora Morrison, MD 11/21/14 2125

## 2014-11-21 NOTE — ED Notes (Signed)
Patient states mid-sternal chest pain X1 week. Denies radiation, NV or SOB.

## 2014-11-26 ENCOUNTER — Emergency Department (HOSPITAL_COMMUNITY)
Admission: EM | Admit: 2014-11-26 | Discharge: 2014-11-26 | Disposition: A | Discharge status: Home / Self Care | Payer: Medicaid Other | Attending: Emergency Medicine | Admitting: Emergency Medicine

## 2014-11-26 ENCOUNTER — Encounter (HOSPITAL_COMMUNITY): Payer: Self-pay | Admitting: Emergency Medicine

## 2014-11-26 DIAGNOSIS — Z8619 Personal history of other infectious and parasitic diseases: Secondary | ICD-10-CM | POA: Insufficient documentation

## 2014-11-26 DIAGNOSIS — R51 Headache: Secondary | ICD-10-CM | POA: Insufficient documentation

## 2014-11-26 DIAGNOSIS — R519 Headache, unspecified: Secondary | ICD-10-CM

## 2014-11-26 DIAGNOSIS — J3489 Other specified disorders of nose and nasal sinuses: Secondary | ICD-10-CM | POA: Diagnosis not present

## 2014-11-26 MED ORDER — METOCLOPRAMIDE HCL 5 MG/ML IJ SOLN
10.0000 mg | Freq: Once | INTRAMUSCULAR | Status: AC
  Administered 2014-11-26: 10 mg via INTRAMUSCULAR
  Filled 2014-11-26: qty 2

## 2014-11-26 MED ORDER — KETOROLAC TROMETHAMINE 60 MG/2ML IM SOLN
60.0000 mg | Freq: Once | INTRAMUSCULAR | Status: AC
  Administered 2014-11-26: 60 mg via INTRAMUSCULAR
  Filled 2014-11-26: qty 2

## 2014-11-26 MED ORDER — METOCLOPRAMIDE HCL 10 MG PO TABS: 10.0000 mg | ORAL_TABLET | Freq: Four times a day (QID) | ORAL | Status: DC | PRN

## 2014-11-26 MED ORDER — DIPHENHYDRAMINE HCL 25 MG PO CAPS
50.0000 mg | ORAL_CAPSULE | Freq: Once | ORAL | Status: AC
  Administered 2014-11-26: 50 mg via ORAL
  Filled 2014-11-26: qty 2

## 2014-11-26 NOTE — ED Provider Notes (Signed)
CSN: [id number]     Arrival date & time 11/26/14  1723 History   First MD Initiated Contact with Patient 11/26/14 1831     Chief Complaint  Patient presents with  . Headache      HPI  Pt was seen at [place]. Per pt, c/o gradual onset and persistence of constant acute flair of her chronic migraine headache for the past 1 week. Pt states she took tylenol "once" and "it didn't work."  Describes the headache as per her usual chronic migraine headache pain pattern for several years. Has been associated with feeling "tired."  Denies headache was sudden or maximal in onset or at any time.  Denies visual changes, no focal motor weakness, no tingling/numbness in extremities, no fevers, no neck pain, no rash, no abd pain, no N/V/D, no cough/SOB, no CP/palpitations.  Pt was evaluated in the ED on 11/21/14 for CP, and states she "forgot to mention the other stuff." Denies any change in her symptoms over the past week.     Past Medical History  Diagnosis Date  . Migraine with aura   . Supervision of other normal pregnancy 01/03/2014  . Chlamydia    Past Surgical History  Procedure Laterality Date  . No past surgeries     Family History  Problem Relation Age of Onset  . Diabetes Maternal Grandmother   . Hypertension Maternal Grandmother    History  Substance Use Topics  . Smoking status: Never Smoker   . Smokeless tobacco: Never Used  . Alcohol Use: No   OB History    Gravida Para Term Preterm AB TAB SAB Ectopic Multiple Living   2 2 1 1  0 0 0 0 0 2     Review of Systems ROS: Statement: All systems negative except as marked or noted in the HPI; Constitutional: Negative for fever and chills. +"feels tired." ; ; Eyes: Negative for eye pain, redness and discharge. ; ; ENMT: Negative for ear pain, hoarseness, sore throat. +nasal congestion, sinus pressure. ; ; Cardiovascular: Negative for chest pain, palpitations, diaphoresis, dyspnea and peripheral edema. ; ; Respiratory: Negative for cough,  wheezing and stridor. ; ; Gastrointestinal: Negative for nausea, vomiting, diarrhea, abdominal pain, blood in stool, hematemesis, jaundice and rectal bleeding. . ; ; Genitourinary: Negative for dysuria, flank pain and hematuria. ; ; Musculoskeletal: Negative for back pain and neck pain. Negative for swelling and trauma.; ; Skin: Negative for pruritus, rash, abrasions, blisters, bruising and skin lesion.; ; Neuro: +headache. Negative for lightheadedness and neck stiffness. Negative for weakness, altered level of consciousness , altered mental status, extremity weakness, paresthesias, involuntary movement, seizure and syncope.      Allergies  Review of patient's allergies indicates no known allergies.  Home Medications   Prior to Admission medications   Medication Sig Start Date End Date Taking? Authorizing Provider  medroxyPROGESTERone (DEPO-PROVERA) 150 MG/ML injection Inject 1 mL (150 mg total) into the muscle every 3 (three) months. 08/15/14  Yes Roma Schanz, CNM   BP 115/74 mmHg  Pulse 85  Temp(Src) 99.4 F (37.4 C) (Oral)  Resp 16  Ht 5\' 6"  (1.676 m)  Wt 155 lb (70.308 kg)  BMI 25.03 kg/m2  SpO2 100%  Breastfeeding? No Physical Exam  1845: Physical examination:  Nursing notes reviewed; Vital signs and O2 SAT reviewed;  Constitutional: Well developed, Well nourished, Well hydrated, In no acute distress; Head:  Normocephalic, atraumatic; Eyes: EOMI, PERRL, No scleral icterus; ENMT: TM's clear bilat. +edemetous nasal turbinates bilat with  clear rhinorrhea. Mouth and pharynx normal, Mucous membranes moist; Neck: Supple, Full range of motion, No lymphadenopathy; Cardiovascular: Regular rate and rhythm, No murmur, rub, or gallop; Respiratory: Breath sounds clear & equal bilaterally, No rales, rhonchi, wheezes.  Speaking full sentences with ease, Normal respiratory effort/excursion; Chest: Nontender, Movement normal; Abdomen: Soft, Nontender, Nondistended, Normal bowel sounds;  Genitourinary: No CVA tenderness; Extremities: Pulses normal, No tenderness, No edema, No calf edema or asymmetry.; Neuro: AA&Ox3, Major CN grossly intact. No facial droop. Speech clear. No gross focal motor or sensory deficits in extremities. Climbs on and off stretcher easily by herself. Gait steady.; Skin: Color normal, Warm, Dry.   ED Course  Procedures     EKG Interpretation None      MDM  MDM Reviewed: previous chart, nursing note and vitals Reviewed previous: labs   Results for orders placed or performed during the hospital encounter of 11/21/14  I-stat chem 8, ed  Result Value Ref Range   Sodium 142 135 - 145 mmol/L   Potassium 3.8 3.5 - 5.1 mmol/L   Chloride 107 96 - 112 mmol/L   BUN 11 6 - 23 mg/dL   Creatinine, Ser 0.70 0.50 - 1.10 mg/dL   Glucose, Bld 95 70 - 99 mg/dL   Calcium, Ion 1.28 (H) 1.12 - 1.23 mmol/L   TCO2 26 0 - 100 mmol/L   Hemoglobin 13.6 12.0 - 15.0 g/dL   HCT 40.0 36.0 - 46.0 %  I-Stat Troponin, ED (not at [facility])  Result Value Ref Range   Troponin i, poc 0.00 0.00 - 0.08 ng/mL   Comment 3           Dg Chest 2 View 11/21/2014   CLINICAL DATA:  19 year old female with a history of chest pain  EXAM: CHEST - 2 VIEW  COMPARISON:  None.  FINDINGS: Cardiomediastinal silhouette projects within normal limits in size and contour. No confluent airspace disease, pneumothorax, or pleural effusion.  No displaced fracture.  Unremarkable appearance of the upper abdomen.  IMPRESSION: No radiographic evidence of acute cardiopulmonary disease.  Signed,  Dulcy Fanny. Earleen Newport, DO  [facility]  [facility]   Electronically Signed   By: Corrie Mckusick D.O.   On: 11/21/2014 20:23    1937:  Improved after meds and wants to go home now. VS remain stable. Tx symptomatically at this time. Dx and testing d/w pt and family.  Questions answered.  Verb understanding, agreeable to d/c home with outpt f/u.    Francine Graven, DO 11/29/14  [phone]

## 2014-11-26 NOTE — Discharge Instructions (Signed)
°Emergency Department Resource Guide °1) Find a Doctor and Pay Out of Pocket °Although you won't have to find out who is covered by your insurance plan, it is a good idea to ask around and get recommendations. You will then need to call the office and see if the doctor you have chosen will accept you as a new patient and what types of options they offer for patients who are self-pay. Some doctors offer discounts or will set up payment plans for their patients who do not have insurance, but you will need to ask so you aren't surprised when you get to your appointment. ° °2) Contact Your Local Health Department °Not all health departments have doctors that can see patients for sick visits, but many do, so it is worth a call to see if yours does. If you don't know where your local health department is, you can check in your phone book. The CDC also has a tool to help you locate your state's health department, and many state websites also have listings of all of their local health departments. ° °3) Find a Walk-in Clinic °If your illness is not likely to be very severe or complicated, you may want to try a walk in clinic. These are popping up all over the country in pharmacies, drugstores, and shopping centers. They're usually staffed by nurse practitioners or physician assistants that have been trained to treat common illnesses and complaints. They're usually fairly quick and inexpensive. However, if you have serious medical issues or chronic medical problems, these are probably not your best option. ° °No Primary Care Doctor: °- Call [facility] Connect at  832-8000 - they can help you locate a primary care doctor that  accepts your insurance, provides certain services, etc. °- Physician Referral Service- 1-800-533-3463 ° °Chronic Pain Problems: °Organization         Address  Phone   Notes  °[facility]  ([phone]) [phone] Patients need to be referred by their primary care doctor.  ° °Medication  Assistance: °Organization         Address  Phone   Notes  °[place] [facility] [address] °[place], [place] [address] (336) 641-8030 --Must be a resident of [place] °-- Must have NO insurance coverage whatsoever (no Medicaid/ Medicare, etc.) °-- The pt. MUST have a primary care doctor that directs their care regularly and follows them in the community °  °[facility]  (866) 331-1348   °[employer]  (888) 892-1162   ° °Agencies that provide inexpensive medical care: °Organization         Address  Phone   Notes  °[facility]  (336) 832-8035   °[facility]    (336) 832-7272   °[facility] [address] °[place], [place] [address] (336) 832-4777   °[facility] [address], °[place] (336) 271-4999   °[facility]    (336) 373-0678   °[facility]    (336) 272-1050   °[facility] ° [address], [place] Phone:  (336) 832-4444, Fax:  (336) 832-4440 Hours of Operation:  9 am - 6 pm, M-F.  Also accepts Medicaid/Medicare and self-pay.  °[facility] ° [address], [place] Phone: (336) [phone], Fax: (336) [phone]. Hours of Operation:  8:30 am - 5:30 pm, M-F.  Also accepts Medicaid and self-pay.  °[facility] [place] [address], [place] Phone: (336) 878-6027   °[facility] [address], [place], [place] (336)723-1848, Ext. 123 Mondays & Thursdays: 7-9 AM.  First 15 patients are seen on a first come, first serve basis. °  ° °Medicaid-accepting [place] Providers: ° °Organization         Address  Phone   Notes  °[facility] [address], [place] (336) 641-2100 Also accepts self-pay patients.  °[facility] [address], [place] ° (336) 856-9996   °[facility] [address], [place]  (336) 288-8857   °[facility] [address] [place] [address], [place] (336) 299-7000   °Veita Bland [address], [place]  ° (336) 373-1557 Only accepts [place] Access Medicaid patients after they have their name applied to their card.  ° °Self-Pay (no insurance) in [place]: ° °Organization         Address  Phone   Notes  °Sickle Cell Patients, [facility] [address], [place] (336) 832-1970   °[facility] [address], [place] (336) 832-4400   °[facility] ° [address], [place] (336) 992-4800   °[facility]/Dr. Osei-Bonsu ° [address] [place] [address], [place] or [address], [place] (336) [phone] Phone number for both [place] and [place] locations is the same.  °[facility] [address], [place] (336) 299-0000   °[facility] [address] [place] [address], [place] or [address] (336) 852-7530 °(336) 878-2260   °[facility] [address], [place] (336) 350-1642, phone; (336) 294-5005, fax Sees patients 1st and 3rd Saturday of every month.  Must not qualify for public or private insurance (i.e. Medicaid, Medicare, [place] Health Choice, Veterans' Benefits) • Household income should be no more than 200% of the poverty level •The clinic cannot treat you if you are pregnant or think you are pregnant • Sexually transmitted diseases are not treated at the clinic.  ° ° °Dental Care: °Organization         Address  Phone  Notes  °[place] [facility] [address], [place] (336) 641-6152 Accepts children up to age 21 who are enrolled in Medicaid or [place] Health Choice; pregnant women with a Medicaid card; and children who have applied for Medicaid or [place] Health Choice, but were declined, whose parents can pay a reduced fee at time of service.  °[place]  [facility] [place]  [address], [place] (336) 641-7733 Accepts children up to age 21 who are enrolled in Medicaid or [place] Health Choice; pregnant women with a Medicaid card; and children who have applied for Medicaid or [place] Health Choice, but were declined, whose parents can pay a reduced fee at time of service.  °[facility] ° [address],  (336) 641-4533 Patients are seen by appointment only. Walk-ins are not accepted. [facility] will see patients 18 years of age and older. °Monday - Tuesday (8am-5pm) °Most Wednesdays (8:30-5pm) °$30 per visit, cash only  °[facility] ° [address], [place] (336) 641-4533 Patients are seen by appointment only. Walk-ins are not accepted. [facility] will see patients 18 years of age and older. °One   Wednesday Evening (Monthly: Volunteer Based).  $30 per visit, cash only  °[facility]  (919) [phone] for adults; Children under age 4, call Graduate Pediatric Dentistry at ([phone]) [phone]. Children aged 4-14, please call (919) [phone] to request a pediatric application. ° Dental services are provided in all areas of dental care including fillings, crowns and bridges, complete and partial dentures, implants, gum treatment, root canals, and extractions. Preventive care is also provided. Treatment is provided to both adults and children. °Patients are selected via a lottery and there is often a waiting list. °  °[facility] [address], °[place] ° (336) 763-8833 [email or web address] °  °[facility] [address], [place], [place] (336)723-1848, Ext. 123 Second and Fourth Thursday of each month, opens at 6:30 AM; Clinic ends at 9 AM.  Patients are seen on a first-come first-served basis, and a limited number are seen during each clinic.  ° °[facility] ° [address], [place], [place] (336) [phone]    Eligibility Requirements °You must have lived in [place], [place], or [place] counties for at least the last three months. °  You cannot be eligible for state or federal sponsored healthcare insurance, including Veterans Administration, Medicaid, or Medicare. °  You generally cannot be eligible for healthcare insurance through your employer.  °  How to apply: °Eligibility screenings are held every Tuesday and Wednesday afternoon from 1:00 pm until 4:00 pm. You do not need an appointment for the interview!  °[facility] [address], [place], [place] 336-631-2330   °[facility]  336-342-8273   °[place]  336-703-3100   °[facility]  336-570-6415   ° °Behavioral Health Resources in the Community: °Intensive Outpatient Programs °Organization         Address  Phone  Notes  °[facility] [address], [place], [place] 336-878-6098   °[facility] [address], [place], [place] 336-832-9800   °[facility] [address], [place], [place] ° 336-882-2125   °[facility] [address],  °[place], [place] 1-800-853-5163 or 336-641-4981   °Substance Abuse Resources °Organization         Address  Phone  Notes  °[facility]  336-882-2125   °[facility]  336-784-9470   °[facility]  336-285-9073   °[facility]  336-845-3988   °Residential & Outpatient Substance Abuse Program  1-800-659-3381   °Psychological Services °Organization         Address  Phone  Notes  °[facility]  336- 832-9600   °[facility]  336- 378-7881   °[facility] [address], [place] 1-800-853-5163 or 336-641-4981   ° °Mobile Crisis Teams °Organization         Address  Phone  Notes  °[facility], Mobile Crisis Care Unit  1-877-626-1772   °Assertive °[facility] ° [address]  [place],  336-834-9664   °Sharon DeEsch [address] °[place]  336-554-5454   ° °Self-Help/Support Groups °Organization         Address  Phone             Notes  °[facility] - variety of support groups  336- 373-1402 Call for more information  °Narcotics Anonymous (NA), [facility] [address], °[place]   2 meetings at this location  ° °  Residential Treatment Programs Organization         Address  Phone  Notes  [facility] [address],    [place]  1-972-330-5492   [facility]  [address] [place] [address], [place], [place]   [place], [place] (920)772-3797 Admissions: 8am-3pm M-F  [facility] [address],    [place], [place] [phone]   [facility] [address] [place], [place], [place]   [facility] [address],  [place], [place]   [facility] Intensive Outpatient [address], [place], [place]   [facility] [address]  [place], [place] 1-612-544-3868 or 250 300 2216   [facility] [address], [place], [place] Accepts Medicaid  [facility] [address],  [place] 1-2364050289 Substance Abuse/Addiction Treatment   [facility]         Address  Phone  Notes  [facility]  (660)135-7541   Domenic Schwab, PhD [address] [place], [place]   (364) 659-9881 or 7155114000   [facility], [place] 334-767-2952   [facility] [address], [place], [place] 5010682833 Insurance/Medicaid/sponsorship through [facility] and Families [address]                                    [place], [place] (930) [phone] [place] [address], [place] [phone]    Dr. Adele Schilder  [phone]   [facility] Dept. 1) [address], [place] 2) [place] 3)  [address], [place] [phone]  [phone]   [place] 347-630-8755 or 603-675-3189 (After Hours)      Take over the counter tylenol and ibuprofen (OR Excedrin) and benadryl, as directed on packaging, with the prescription given to you today, as needed for headache.  Keep a headache diary, as discussed.  Call your regular medical doctor tomorrow to schedule a follow up appointment within the next 3 days.  Return to the Emergency Department immediately sooner if worsening.

## 2014-11-26 NOTE — ED Notes (Signed)
Pt reports "sharp pains" to head and intermittent dizziness for last several days. nad noted. Pt alert and oriented. Speech clear. Facial symmetry.

## 2014-11-26 NOTE — ED Notes (Signed)
MD McManus at bedside. 

## 2014-11-29 ENCOUNTER — Emergency Department (HOSPITAL_COMMUNITY)
Admission: EM | Admit: 2014-11-29 | Discharge: 2014-11-29 | Disposition: A | Discharge status: Home / Self Care | Payer: Medicaid Other | Attending: Emergency Medicine | Admitting: Emergency Medicine

## 2014-11-29 ENCOUNTER — Encounter (HOSPITAL_COMMUNITY): Payer: Self-pay | Admitting: Emergency Medicine

## 2014-11-29 DIAGNOSIS — Z793 Long term (current) use of hormonal contraceptives: Secondary | ICD-10-CM | POA: Insufficient documentation

## 2014-11-29 DIAGNOSIS — R220 Localized swelling, mass and lump, head: Secondary | ICD-10-CM | POA: Insufficient documentation

## 2014-11-29 DIAGNOSIS — G43109 Migraine with aura, not intractable, without status migrainosus: Secondary | ICD-10-CM | POA: Insufficient documentation

## 2014-11-29 DIAGNOSIS — T450X5A Adverse effect of antiallergic and antiemetic drugs, initial encounter: Secondary | ICD-10-CM | POA: Insufficient documentation

## 2014-11-29 DIAGNOSIS — T50905A Adverse effect of unspecified drugs, medicaments and biological substances, initial encounter: Secondary | ICD-10-CM

## 2014-11-29 DIAGNOSIS — Z8619 Personal history of other infectious and parasitic diseases: Secondary | ICD-10-CM | POA: Diagnosis not present

## 2014-11-29 DIAGNOSIS — R110 Nausea: Secondary | ICD-10-CM | POA: Insufficient documentation

## 2014-11-29 MED ORDER — DIPHENHYDRAMINE HCL 50 MG/ML IJ SOLN
25.0000 mg | Freq: Once | INTRAMUSCULAR | Status: AC
  Administered 2014-11-29: 25 mg via INTRAVENOUS
  Filled 2014-11-29: qty 1

## 2014-11-29 MED ORDER — SODIUM CHLORIDE 0.9 % IV BOLUS (SEPSIS)
500.0000 mL | Freq: Once | INTRAVENOUS | Status: AC
  Administered 2014-11-29: 500 mL via INTRAVENOUS

## 2014-11-29 MED ORDER — ONDANSETRON 4 MG PO TBDP: ORAL_TABLET | ORAL | Status: DC

## 2014-11-29 MED ORDER — TRAMADOL HCL 50 MG PO TABS
50.0000 mg | ORAL_TABLET | Freq: Four times a day (QID) | ORAL | Status: DC | PRN
Start: 2014-11-29 — End: 2014-12-21

## 2014-11-29 MED ORDER — ONDANSETRON HCL 4 MG/2ML IJ SOLN
4.0000 mg | Freq: Once | INTRAMUSCULAR | Status: AC
  Administered 2014-11-29: 4 mg via INTRAVENOUS
  Filled 2014-11-29: qty 2

## 2014-11-29 NOTE — ED Provider Notes (Signed)
CSN: [id number]     Arrival date & time 11/29/14  1937 History   First MD Initiated Contact with Patient 11/29/14 1946     Chief Complaint  Patient presents with  . Medication Reaction     (Consider location/radiation/quality/duration/timing/severity/associated sxs/prior Treatment) Patient is a 19 y.o. female presenting with allergic reaction. The history is provided by the patient (pt took reglan and had drawing of her face).  Allergic Reaction Presenting symptoms: no difficulty breathing and no rash   Severity:  Moderate Prior allergic episodes:  No prior episodes Context: no animal exposure   Relieved by:  Nothing Worsened by:  Nothing tried   Past Medical History  Diagnosis Date  . Migraine with aura   . Supervision of other normal pregnancy 01/03/2014  . Chlamydia    Past Surgical History  Procedure Laterality Date  . No past surgeries     Family History  Problem Relation Age of Onset  . Diabetes Maternal Grandmother   . Hypertension Maternal Grandmother    History  Substance Use Topics  . Smoking status: Never Smoker   . Smokeless tobacco: Never Used  . Alcohol Use: No   OB History    Gravida Para Term Preterm AB TAB SAB Ectopic Multiple Living   2 2 1 1  0 0 0 0 0 2     Review of Systems  Constitutional: Negative for appetite change and fatigue.  HENT: Positive for facial swelling. Negative for congestion, ear discharge and sinus pressure.   Eyes: Negative for discharge.  Respiratory: Negative for cough.   Cardiovascular: Negative for chest pain.  Gastrointestinal: Positive for nausea. Negative for abdominal pain and diarrhea.  Genitourinary: Negative for frequency and hematuria.  Musculoskeletal: Negative for back pain.  Skin: Negative for rash.  Neurological: Negative for seizures and headaches.  Psychiatric/Behavioral: Negative for hallucinations.      Allergies  Review of patient's allergies indicates no known allergies.  Home Medications    Prior to Admission medications   Medication Sig Start Date End Date Taking? Authorizing Provider  medroxyPROGESTERone (DEPO-PROVERA) 150 MG/ML injection Inject 1 mL (150 mg total) into the muscle every 3 (three) months. 08/15/14  Yes Roma Schanz, CNM  metoCLOPramide (REGLAN) 10 MG tablet Take 1 tablet (10 mg total) by mouth every 6 (six) hours as needed for nausea (for headache or nausea). 11/26/14  Yes Francine Graven, DO  ondansetron (ZOFRAN ODT) 4 MG disintegrating tablet 4mg  ODT q4 hours prn nausea/vomit 11/29/14   Milton Ferguson, MD  traMADol (ULTRAM) 50 MG tablet Take 1 tablet (50 mg total) by mouth every 6 (six) hours as needed. 11/29/14   Milton Ferguson, MD   BP 139/68 mmHg  Pulse 102  Temp(Src) 98.4 F (36.9 C) (Oral)  Resp 22  Ht 5\' 6"  (1.676 m)  Wt 156 lb (70.761 kg)  BMI 25.19 kg/m2  SpO2 100% Physical Exam  Constitutional: She is oriented to person, place, and time. She appears well-developed.  HENT:  Head: Normocephalic.  Eyes: Conjunctivae and EOM are normal. No scleral icterus.  Neck: Neck supple. No thyromegaly present.  Cardiovascular: Normal rate and regular rhythm.  Exam reveals no gallop and no friction rub.   No murmur heard. Pulmonary/Chest: No stridor. She has no wheezes. She has no rales. She exhibits no tenderness.  Abdominal: She exhibits no distension. There is no tenderness. There is no rebound.  Musculoskeletal: Normal range of motion. She exhibits no edema.  Lymphadenopathy:    She has no cervical adenopathy.  Neurological: She is oriented to person, place, and time. She exhibits normal muscle tone. Coordination normal.  Skin: No rash noted. No erythema.  Psychiatric: She has a normal mood and affect. Her behavior is normal.    ED Course  Procedures (including critical care time) Labs Review Labs Reviewed - No data to display  Imaging Review No results found.   EKG Interpretation None      MDM   Final diagnoses:  Medication reaction,  initial encounter    Reaction to reglan improved.  tx with benadryl and ultram for pain    M[place] Ferguson, MD 11/29/14 2106

## 2014-11-29 NOTE — Discharge Instructions (Signed)
Do not take reglan again and follow up as needed

## 2014-11-29 NOTE — ED Notes (Signed)
Pt states her mouth twist to the side when she talks after starting new med for headaches this am. Pt states she feels as if she is going to pass out.

## 2014-11-30 ENCOUNTER — Encounter (HOSPITAL_COMMUNITY): Payer: Self-pay | Admitting: *Deleted

## 2014-11-30 ENCOUNTER — Emergency Department (HOSPITAL_COMMUNITY)
Admission: EM | Admit: 2014-11-30 | Discharge: 2014-11-30 | Disposition: A | Discharge status: Home / Self Care | Payer: Medicaid Other | Attending: Emergency Medicine | Admitting: Emergency Medicine

## 2014-11-30 DIAGNOSIS — G43909 Migraine, unspecified, not intractable, without status migrainosus: Secondary | ICD-10-CM | POA: Diagnosis not present

## 2014-11-30 DIAGNOSIS — Z8619 Personal history of other infectious and parasitic diseases: Secondary | ICD-10-CM | POA: Insufficient documentation

## 2014-11-30 DIAGNOSIS — R079 Chest pain, unspecified: Secondary | ICD-10-CM | POA: Diagnosis not present

## 2014-11-30 LAB — I-STAT CHEM 8, ED
BUN: 5 mg/dL — AB (ref 6–20)
CALCIUM ION: 1.28 mmol/L — AB (ref 1.12–1.23)
CREATININE: 0.7 mg/dL (ref 0.44–1.00)
Chloride: 107 mmol/L (ref 101–111)
GLUCOSE: 93 mg/dL (ref 70–99)
HCT: 41 % (ref 36.0–46.0)
Hemoglobin: 13.9 g/dL (ref 12.0–15.0)
Potassium: 3.8 mmol/L (ref 3.5–5.1)
Sodium: 143 mmol/L (ref 135–145)
TCO2: 19 mmol/L (ref 0–100)

## 2014-11-30 LAB — I-STAT TROPONIN, ED: Troponin i, poc: 0 ng/mL (ref 0.00–0.08)

## 2014-11-30 MED ORDER — NAPROXEN 500 MG PO TABS: 500.0000 mg | ORAL_TABLET | Freq: Two times a day (BID) | ORAL | Status: DC

## 2014-11-30 MED ORDER — FAMOTIDINE 20 MG PO TABS: 20.0000 mg | ORAL_TABLET | Freq: Two times a day (BID) | ORAL | Status: DC

## 2014-11-30 NOTE — Discharge Instructions (Signed)
EKG and lab tests done without any significant findings to explain the chest pain. Trial of the Naprosyn as an anti-inflammatory and Pepcid to see if it makes any difference. Resource guide provided below to help you find a regular doctor.   Emergency Department Resource Guide 1) Find a Doctor and Pay Out of Pocket Although you won't have to find out who is covered by your insurance plan, it is a good idea to ask around and get recommendations. You will then need to call the office and see if the doctor you have chosen will accept you as a new patient and what types of options they offer for patients who are self-pay. Some doctors offer discounts or will set up payment plans for their patients who do not have insurance, but you will need to ask so you aren't surprised when you get to your appointment.  2) Contact Your Local Health Department Not all health departments have doctors that can see patients for sick visits, but many do, so it is worth a call to see if yours does. If you don't know where your local health department is, you can check in your phone book. The CDC also has a tool to help you locate your state's health department, and many state websites also have listings of all of their local health departments.  3) Find a [facility] If your illness is not likely to be very severe or complicated, you may want to try a walk in clinic. These are popping up all over the country in pharmacies, drugstores, and shopping centers. They're usually staffed by nurse practitioners or physician assistants that have been trained to treat common illnesses and complaints. They're usually fairly quick and inexpensive. However, if you have serious medical issues or chronic medical problems, these are probably not your best option.  No Primary Care Doctor: - Call Health Connect at  [phone] - they can help you locate a primary care doctor that  accepts your insurance, provides certain services,  etc. - Physician Referral Service- [phone]  Chronic Pain Problems: Organization         Address  Phone   Notes  [facility]  [phone] Patients need to be referred by their primary care doctor.   Medication Assistance: Organization         Address  Phone   Notes  [facility] Medication [facility] [address] [place], [place] [address] [phone] --Must be a resident of [facility] -- Must have NO insurance coverage whatsoever (no Medicaid/ Medicare, etc.) -- The pt. MUST have a primary care doctor that directs their care regularly and follows them in the community   MedAssist  [phone]   [employer]  [phone]    Agencies that provide inexpensive medical care: Organization         Address  Phone   Notes  [place]  [phone]   [facility]    [phone]   [facility] [place], [place] [address] [phone]   [place] [address] [place] [phone]   Planned Parenthood    [phone]   [facility]    [phone]   [place] and [place] [address], [place] Phone:  [phone], Fax:  [phone] Hours of Operation:  9 am - 6 pm, M-F.  Also accepts Medicaid/Medicare  and self-pay.  [facility] for [place] [address], [address], [place] Phone: [phone], Fax: [phone]. Hours of Operation:  8:30 am - 5:30 pm, M-F.  Also accepts Medicaid and self-pay.  [facility] [place] [address], [place] Phone: [phone]   [place], [place], [place] [phone], Ext. [phone] Mondays & Thursdays: 7-9 AM.  First 15 patients are seen on a first come, first serve basis.    [place] Providers:  Organization         Address  Phone   Notes  [facility] [address], [place] [phone] Also accepts self-pay patients.  [facility] [address], [place]  [phone]   [place], [address], [place] [phone]   [facility] [address] [place] [phone]   Lucianne Lei [address], [place]   [phone] Only accepts [place] Access [place] patients after they have their name applied to their card.   Self-Pay (no insurance) in [facility]:  Organization         Address  Phone   Notes  Sickle Cell Patients, [facility] [place] [phone]   [facility] [place] [phone]   [facility] [place]  [place], [place], [place] [phone]   [facility]/Dr. Osei-Bonsu  [address] [place] or [address], [place] [phone] Phone number for both [place] and [place] locations is the same.  [facility] [address] [place] [phone]   [facility] [address] [place] or [address] [phone]   [facility] [place] [address] [place] [phone], phone; [phone], fax Sees patients 1st and 3rd Saturday of every month.  Must not qualify for public or private insurance (i.e. Medicaid, Medicare, [place] Health Choice, Veterans' Benefits)  Household income should be no more than 200% of the poverty level The clinic cannot treat you if you are pregnant or think you are pregnant  Sexually transmitted diseases are not treated at the clinic.    Dental Care: Organization         Address  Phone  Notes  [facility] Department of [facility] [phone] Accepts children up to  age 26 who are enrolled in [place] or [place]; pregnant women with a Medicaid card; and children who have applied for Medicaid or [place] Health Choice, but were declined, whose parents can pay a reduced fee at time of service.  [facility] Department of [facility]  [address], [place] [phone] Accepts children up to age 43 who are enrolled in [place] or [place]; pregnant women with a Medicaid card; and children who have applied for Medicaid or [place] Health Choice, but were declined, whose parents can pay a reduced fee at time of service.  [facility] PRO[facility]  [place] [phone] Patients are seen by appointment only. Walk-ins are not accepted. [place] will see patients 55 years of age and older. Monday - Tuesday (8am-5pm) Most Wednesdays (  8:30-5pm) $30 per visit, cash only  [facility] Adult Dental Access PROGRAM  [address], [facility] [phone] Patients are seen by appointment only. Walk-ins are not accepted. [place] will see patients 72 years of age and older. One Wednesday Evening (Monthly: Volunteer Based).  $30 per visit, cash only  [place]  [phone] for adults; Children under age 45, call Graduate Pediatric Dentistry at [phone]. Children aged 7-14, please call [phone] to request a pediatric application.  Dental services are provided in all areas of dental care including fillings, crowns and bridges, complete and partial dentures, implants, gum treatment, root canals, and extractions. Preventive care is also provided. Treatment is provided to both adults and children. Patients are selected via a lottery and there is often a waiting list.   [facility] [address], [place]  [phone] [email or web address]   [facility] [address] [place], [place] [phone], Ext. [phone] Second and Fourth Thursday of  each month, opens at 6:30 AM; Clinic ends at 9 AM.  Patients are seen on a first-come first-served basis, and a limited number are seen during each clinic.   [facility]  [address] Hillard Danker [place], [place] [phone]   Eligibility Requirements You must have lived in [place], [place], or [place] counties for at least the last three months.   You cannot be eligible for state or federal sponsored [employer], including [employer], [place], or [employer].   You generally cannot be eligible for healthcare insurance through your employer.    How to apply: Eligibility screenings are held every Tuesday and Wednesday afternoon from 1:00 pm until 4:00 pm. You do not need an appointment for the interview!  [facility] [address], [place], [place]   [place]  [place] Department  [place]  [phone]    Behavioral Health Resources in the Community: Intensive Outpatient Programs Organization         Address  Phone  Notes  [place]. [address], [place], [place] [phone]   [facility] Outpatient [address] [place], [place]   ADS: Alcohol [facility] Drug S[facility] [address] [place], [place]   [place] [address],  [place], [place] or [phone]   Substance Abuse Resources Organization         Address  Phone  Notes  Alcohol and Drug Services  [phone]   [place]  [phone]   [facility] [place]   Chinita Pester  [phone]   Residential & Outpatient Substance Abuse Program  [phone]   Psychological Services Organization         Address  Phone  Notes  [facility] [place]  [place]  [phone]   [place] [address],  [place] or [phone]    Mobile Crisis Teams Organization         Address  Phone  Notes  [facility], Mobile Crisis Care Unit  [phone]   [facility] Psychotherapeutic Services  [address] [place], [place]   [place] Levels [address] [place] [phone]    Self-Help/Support Groups Organization         Address  Phone  Notes  [facility] of [place] - variety of support groups  [place] Call for more information  Narcotics Anonymous (NA), [facility] [address], [employer] [place]  2 meetings at this location   Special educational needs teacher         Address  Phone  Notes  ASAP Residential Treatment [place],    [place]  [phone]   [facility]  [address], [place] [phone], [place], [place]   [place], [place] [phone] Admissions: 8am-3pm M-F  Incentives Substance [place] [phone],    [place], [place] [phone]   [facility] [address] [place], [place], [place]   [facility] [address]  [place], [place]   [facility] - Intensive Outpatient [address], [place], [place]   [facility] ([place].) [place].,  [place], [place] [phone] or [phone]   [facility] Services (RT[facility]) [address] [place], [place] Accepts Medicaid  [facility] [place] [address],  [place] [phone] Substance Abuse/Addiction Treatment   [facility] Organization         Address  Phone  Notes  [facility]  [phone]   Domenic Schwab, PhD [address] Arlis [place], [place]   [phone] or [phone]   [place], [place] [phone]     [facility] [address], [place], [place] [phone] Insurance/Medicaid/sponsorship through [facility] and Families [address], [address]                                    [place], [place] [phone] [place] [address], [place] [phone]    Dr. Adele Schilder  [phone]   [facility] Dept. 1) [address], [place] 2) [place] 3)  [address], [place] [phone]  [phone]   [place] [phone] or [phone] (After Hours)

## 2014-11-30 NOTE — ED Notes (Signed)
Chest pain, intermittently for 1 week, seen here recently for same.   No cough,

## 2014-11-30 NOTE — ED Provider Notes (Signed)
CSN: [id number]     Arrival date & time 11/30/14  1519 History   First MD Initiated Contact with Patient 11/30/14 1523     Chief Complaint  Patient presents with  . Chest Pain     (Consider location/radiation/quality/duration/timing/severity/associated sxs/prior Treatment) Patient is a 19 y.o. female presenting with chest pain. The history is provided by the patient.  Chest Pain Associated symptoms: no abdominal pain, no back pain, no fever, no headache, no nausea, no shortness of breath and not vomiting    patient would complain of intermittent chest pain when is present last anywhere from 30 minutes to an hour's been ongoing for greater than a week. Patient seen April 27 for same complaint. Workup at that time was negative. Nothing new or different about this. Pain is 7 out of 10 nonradiating located substernal area. Kind of ache and occasionally sharp. No nausea or vomiting related to the chest pain or patient was seen yesterday for medication reaction to did have nausea and vomiting. No shortness of breath. No fevers. No abdominal pain. No back pain.  Past Medical History  Diagnosis Date  . Migraine with aura   . Supervision of other normal pregnancy 01/03/2014  . Chlamydia    Past Surgical History  Procedure Laterality Date  . No past surgeries     Family History  Problem Relation Age of Onset  . Diabetes Maternal Grandmother   . Hypertension Maternal Grandmother    History  Substance Use Topics  . Smoking status: Never Smoker   . Smokeless tobacco: Never Used  . Alcohol Use: No   OB History    Gravida Para Term Preterm AB TAB SAB Ectopic Multiple Living   2 2 1 1  0 0 0 0 0 2     Review of Systems  Constitutional: Negative for fever.  HENT: Negative for congestion.   Eyes: Negative for visual disturbance.  Respiratory: Negative for shortness of breath.   Cardiovascular: Positive for chest pain.  Gastrointestinal: Negative for nausea, vomiting and abdominal pain.   Genitourinary: Negative for dysuria.  Musculoskeletal: Negative for back pain.  Skin: Negative for rash.  Neurological: Negative for headaches.  Hematological: Does not bruise/bleed easily.  Psychiatric/Behavioral: Negative for confusion.      Allergies  Reglan  Home Medications   Prior to Admission medications   Medication Sig Start Date End Date Taking? Authorizing Provider  medroxyPROGESTERone (DEPO-PROVERA) 150 MG/ML injection Inject 1 mL (150 mg total) into the muscle every 3 (three) months. 08/15/14  Yes Roma Schanz, CNM  famotidine (PEPCID) 20 MG tablet Take 1 tablet (20 mg total) by mouth 2 (two) times daily. 11/30/14   Fredia Sorrow, MD  metoCLOPramide (REGLAN) 10 MG tablet Take 1 tablet (10 mg total) by mouth every 6 (six) hours as needed for nausea (for headache or nausea). Patient not taking: Reported on 11/30/2014 11/26/14   Francine Graven, DO  naproxen (NAPROSYN) 500 MG tablet Take 1 tablet (500 mg total) by mouth 2 (two) times daily. 11/30/14   Fredia Sorrow, MD  ondansetron (ZOFRAN ODT) 4 MG disintegrating tablet 4mg  ODT q4 hours prn nausea/vomit Patient taking differently: Take 4 mg by mouth every 4 (four) hours as needed for nausea or vomiting.  11/29/14   Milton Ferguson, MD  traMADol (ULTRAM) 50 MG tablet Take 1 tablet (50 mg total) by mouth every 6 (six) hours as needed. 11/29/14   Milton Ferguson, MD   BP 124/76 mmHg  Pulse 83  Temp(Src) 98.7 F (37.1  C) (Oral)  Resp 18  Ht 5\' 6"  (1.676 m)  Wt 155 lb (70.308 kg)  BMI 25.03 kg/m2  SpO2 100%  Breastfeeding? No Physical Exam  Constitutional: She is oriented to person, place, and time. She appears well-developed and well-nourished. No distress.  HENT:  Head: Normocephalic and atraumatic.  Mouth/Throat: Oropharynx is clear and moist.  Eyes: Conjunctivae and EOM are normal.  Neck: Normal range of motion. Neck supple.  Cardiovascular: Normal rate, regular rhythm and normal heart sounds.   Pulmonary/Chest:  Effort normal and breath sounds normal.  Abdominal: Soft. Bowel sounds are normal.  Musculoskeletal: Normal range of motion.  Neurological: She is alert and oriented to person, place, and time. No cranial nerve deficit. She exhibits normal muscle tone. Coordination normal.  Skin: Skin is warm.  Nursing note and vitals reviewed.   ED Course  Procedures (including critical care time) Labs Review Labs Reviewed  I-STAT CHEM 8, ED - Abnormal; Notable for the following:    BUN 5 (*)    Calcium, Ion 1.28 (*)    All other components within normal limits  I-STAT TROPOININ, ED   Results for orders placed or performed during the hospital encounter of 11/30/14  I-Stat Troponin, ED (not at [facility])  Result Value Ref Range   Troponin i, poc 0.00 0.00 - 0.08 ng/mL   Comment 3          I-Stat Chem 8, ED  Result Value Ref Range   Sodium 143 135 - 145 mmol/L   Potassium 3.8 3.5 - 5.1 mmol/L   Chloride 107 101 - 111 mmol/L   BUN 5 (L) 6 - 20 mg/dL   Creatinine, Ser 0.70 0.44 - 1.00 mg/dL   Glucose, Bld 93 70 - 99 mg/dL   Calcium, Ion 1.28 (H) 1.12 - 1.23 mmol/L   TCO2 19 0 - 100 mmol/L   Hemoglobin 13.9 12.0 - 15.0 g/dL   HCT 41.0 36.0 - 46.0 %     Imaging Review No results found.   EKG Interpretation   Date/Time:  Friday Nov 30 2014 16:07:12 EDT Ventricular Rate:  84 PR Interval:  126 QRS Duration: 81 QT Interval:  356 QTC Calculation: 421 R Axis:   84 Text Interpretation:  Sinus rhythm Baseline wander in lead(s) V5 No  significant change since last tracing Reconfirmed by Taite Schoeppner  MD, Charlet Harr  [phone]) on 11/30/2014 4:18:38 PM      MDM   Final diagnoses:  Chest pain, unspecified chest pain type    Patient with over a week's worth of intermittent chest pain. Patient seen specifically for this on April 27 with chest x-ray that was negative troponin and labs that were negative. Patient also seen just yesterday for medication reaction due to the Reglan. But did not have any workup  for the chest pain. Today's workup he EKG without any acute changes. Troponin is negative no evidence of anemia basic electrolytes are normal. Will give patient a trial of Pepcid and Naprosyn. Do not feel that there is any concern for pulmonary embolus. Room air sats are 100% tachycardia. And certainly no evidence of any acute cardiac event.    Fredia Sorrow, MD 11/30/14 1630

## 2014-12-20 ENCOUNTER — Telehealth: Payer: Self-pay | Admitting: *Deleted

## 2014-12-20 NOTE — Telephone Encounter (Signed)
Left message to call in am ,would need appt

## 2014-12-20 NOTE — Telephone Encounter (Signed)
Pt states that she was put on a depression medication and she can't remember the name of the medication. Pt states that she has not had her medication for about a month or two. I advised the pt that I would send this message to a provider to review and let her know if the provider wants to see her before starting her on the Lexapro again. Pt verbalized understanding. I advised the pt that I would try to call her back this afternoon but if not it would be first thing in the morning.

## 2014-12-21 ENCOUNTER — Ambulatory Visit (INDEPENDENT_AMBULATORY_CARE_PROVIDER_SITE_OTHER): Payer: Medicaid Other | Admitting: Adult Health

## 2014-12-21 ENCOUNTER — Encounter: Payer: Self-pay | Admitting: Adult Health

## 2014-12-21 VITALS — BP 112/70 | HR 76 | Ht 66.0 in | Wt 150.0 lb

## 2014-12-21 DIAGNOSIS — F53 Postpartum depression: Secondary | ICD-10-CM

## 2014-12-21 DIAGNOSIS — F329 Major depressive disorder, single episode, unspecified: Secondary | ICD-10-CM

## 2014-12-21 DIAGNOSIS — F32A Depression, unspecified: Secondary | ICD-10-CM

## 2014-12-21 DIAGNOSIS — O99345 Other mental disorders complicating the puerperium: Principal | ICD-10-CM

## 2014-12-21 DIAGNOSIS — R5383 Other fatigue: Secondary | ICD-10-CM

## 2014-12-21 MED ORDER — ESCITALOPRAM OXALATE 10 MG PO TABS: 10.0000 mg | ORAL_TABLET | Freq: Every day | ORAL | Status: DC

## 2014-12-21 NOTE — Patient Instructions (Signed)
Postpartum Depression and Baby Blues The postpartum period begins right after the birth of a baby. During this time, there is often a great amount of joy and excitement. It is also a time of many changes in the life of the parents. Regardless of how many times a mother gives birth, each child brings new challenges and dynamics to the family. It is not unusual to have feelings of excitement along with confusing shifts in moods, emotions, and thoughts. All mothers are at risk of developing postpartum depression or the "baby blues." These mood changes can occur right after giving birth, or they may occur many months after giving birth. The baby blues or postpartum depression can be mild or severe. Additionally, postpartum depression can go away rather quickly, or it can be a long-term condition.  CAUSES Raised hormone levels and the rapid drop in those levels are thought to be a main cause of postpartum depression and the baby blues. A number of hormones change during and after pregnancy. Estrogen and progesterone usually decrease right after the delivery of your baby. The levels of thyroid hormone and various cortisol steroids also rapidly drop. Other factors that play a role in these mood changes include major life events and genetics.  RISK FACTORS If you have any of the following risks for the baby blues or postpartum depression, know what symptoms to watch out for during the postpartum period. Risk factors that may increase the likelihood of getting the baby blues or postpartum depression include:  Having a personal or family history of depression.   Having depression while being pregnant.   Having premenstrual mood issues or mood issues related to oral contraceptives.  Having a lot of life stress.   Having marital conflict.   Lacking a social support network.   Having a baby with special needs.   Having health problems, such as diabetes.  SIGNS AND SYMPTOMS Symptoms of baby blues  include:  Brief changes in mood, such as going from extreme happiness to sadness.  Decreased concentration.   Difficulty sleeping.   Crying spells, tearfulness.   Irritability.   Anxiety.  Symptoms of postpartum depression typically begin within the first month after giving birth. These symptoms include:  Difficulty sleeping or excessive sleepiness.   Marked weight loss.   Agitation.   Feelings of worthlessness.   Lack of interest in activity or food.  Postpartum psychosis is a very serious condition and can be dangerous. Fortunately, it is rare. Displaying any of the following symptoms is cause for immediate medical attention. Symptoms of postpartum psychosis include:   Hallucinations and delusions.   Bizarre or disorganized behavior.   Confusion or disorientation.  DIAGNOSIS  A diagnosis is made by an evaluation of your symptoms. There are no medical or lab tests that lead to a diagnosis, but there are various questionnaires that a health care provider may use to identify those with the baby blues, postpartum depression, or psychosis. Often, a screening tool called the [place] Postnatal Depression Scale is used to diagnose depression in the postpartum period.  TREATMENT The baby blues usually goes away on its own in 1-2 weeks. Social support is often all that is needed. You will be encouraged to get adequate sleep and rest. Occasionally, you may be given medicines to help you sleep.  Postpartum depression requires treatment because it can last several months or longer if it is not treated. Treatment may include individual or group therapy, medicine, or both to address any social, physiological, and psychological  factors that may play a role in the depression. Regular exercise, a healthy diet, rest, and social support may also be strongly recommended.  Postpartum psychosis is more serious and needs treatment right away. Hospitalization is often needed. HOME CARE  INSTRUCTIONS  Get as much rest as you can. Nap when the baby sleeps.   Exercise regularly. Some women find yoga and walking to be beneficial.   Eat a balanced and nourishing diet.   Do little things that you enjoy. Have a cup of tea, take a bubble bath, read your favorite magazine, or listen to your favorite music.  Avoid alcohol.   Ask for help with household chores, cooking, grocery shopping, or running errands as needed. Do not try to do everything.   Talk to people close to you about how you are feeling. Get support from your partner, family members, friends, or other new moms.  Try to stay positive in how you think. Think about the things you are grateful for.   Do not spend a lot of time alone.   Only take over-the-counter or prescription medicine as directed by your health care provider.  Keep all your postpartum appointments.   Let your health care provider know if you have any concerns.  SEEK MEDICAL CARE IF: You are having a reaction to or problems with your medicine. SEEK IMMEDIATE MEDICAL CARE IF:  You have suicidal feelings.   You think you may harm the baby or someone else. MAKE SURE YOU:  Understand these instructions.  Will watch your condition.  Will get help right away if you are not doing well or get worse. Document Released: 04/16/2004 Document Revised: 07/18/2013 Document Reviewed: 04/24/2013 [facility] Patient Information 2015 [place], [place]. This information is not intended to replace advice given to you by your health care provider. Make sure you discuss any questions you have with your health care provider. Depression Depression refers to feeling sad, low, down in the dumps, blue, gloomy, or empty. In general, there are two kinds of depression:  Normal sadness or normal grief. This kind of depression is one that we all feel from time to time after upsetting life experiences, such as the loss of a job or the ending of a relationship. This  kind of depression is considered normal, is short lived, and resolves within a few days to 2 weeks. Depression experienced after the loss of a loved one (bereavement) often lasts longer than 2 weeks but normally gets better with time.  Clinical depression. This kind of depression lasts longer than normal sadness or normal grief or interferes with your ability to function at home, at work, and in school. It also interferes with your personal relationships. It affects almost every aspect of your life. Clinical depression is an illness. Symptoms of depression can also be caused by conditions other than those mentioned above, such as:  Physical illness. Some physical illnesses, including underactive thyroid gland (hypothyroidism), severe anemia, specific types of cancer, diabetes, uncontrolled seizures, heart and lung problems, strokes, and chronic pain are commonly associated with symptoms of depression.  Side effects of some prescription medicine. In some people, certain types of medicine can cause symptoms of depression.  Substance abuse. Abuse of alcohol and illicit drugs can cause symptoms of depression. SYMPTOMS Symptoms of normal sadness and normal grief include the following:  Feeling sad or crying for short periods of time.  Not caring about anything (apathy).  Difficulty sleeping or sleeping too much.  No longer able to enjoy the things  you used to enjoy.  Desire to be by oneself all the time (social isolation).  Lack of energy or motivation.  Difficulty concentrating or remembering.  Change in appetite or weight.  Restlessness or agitation. Symptoms of clinical depression include the same symptoms of normal sadness or normal grief and also the following symptoms:  Feeling sad or crying all the time.  Feelings of guilt or worthlessness.  Feelings of hopelessness or helplessness.  Thoughts of suicide or the desire to harm yourself (suicidal ideation).  Loss of touch with  reality (psychotic symptoms). Seeing or hearing things that are not real (hallucinations) or having false beliefs about your life or the people around you (delusions and paranoia). DIAGNOSIS  The diagnosis of clinical depression is usually based on how bad the symptoms are and how long they have lasted. Your health care provider will also ask you questions about your medical history and substance use to find out if physical illness, use of prescription medicine, or substance abuse is causing your depression. Your health care provider may also order blood tests. TREATMENT  Often, normal sadness and normal grief do not require treatment. However, sometimes antidepressant medicine is given for bereavement to ease the depressive symptoms until they resolve. The treatment for clinical depression depends on how bad the symptoms are but often includes antidepressant medicine, counseling with a mental health professional, or both. Your health care provider will help to determine what treatment is best for you. Depression caused by physical illness usually goes away with appropriate medical treatment of the illness. If prescription medicine is causing depression, talk with your health care provider about stopping the medicine, decreasing the dose, or changing to another medicine. Depression caused by the abuse of alcohol or illicit drugs goes away when you stop using these substances. Some adults need professional help in order to stop drinking or using drugs. SEEK IMMEDIATE MEDICAL CARE IF:  You have thoughts about hurting yourself or others.  You lose touch with reality (have psychotic symptoms).  You are taking medicine for depression and have a serious side effect. FOR MORE INFORMATION  National Alliance on Mental Illness: www.nami.[employer]: https://carter.com/ Document Released: 07/10/2000 Document Revised: 11/27/2013 Document Reviewed: 10/12/2011 [facility] Patient  Information 2015 [place], [place]. This information is not intended to replace advice given to you by your health care provider. Make sure you discuss any questions you have with your health care provider. Take lexapro 1 daily Return in 4 weeks

## 2014-12-21 NOTE — Progress Notes (Signed)
Subjective:     Patient ID: [name], female   DOB: Jan 29, 1996, 19 y.o.   MRN: [id number]  HPI work in appt. Heavenleigh is a 19 year old black female in complaining of being depressed, was treated for depression in January after birth of baby, but stopped meds about 2 months ago.She says her head and eyes feel heavy and has had a headache at times and chest pain and was seen in the ER.She is not sleeping well, only about 5 hours and she is tired, but is eating good and denies suicidal ideations.  Review of Systems Patient denies any  hearing loss,  blurred vision, shortness of breath, chest pain, abdominal pain, problems with bowel movements, urination, or intercourse. No joint pain, see HPI for positives.    Objective:   Physical Exam BP 112/70 mmHg  Pulse 76  Ht 5\' 6"  (1.676 m)  Wt 150 lb (68.04 kg)  BMI 24.22 kg/m2  Breastfeeding? No Skin warm and dry. Neck: mid line trachea, normal thyroid, good ROM, no lymphadenopathy noted. Lungs: clear to ausculation bilaterally. Cardiovascular: regular rate and rhythm. PHQ9 score 13 and EPDS score is 20, it was 9 on 08/15/14.Will try lexapro again, take 1 daily and it will take about 2 weeks to notice a big change, and will check labs today, she will think about referral to counselor, like [facility].    Assessment:     Postpartum depression Fatigue     Plan:     Check CBC,CMP,TSH  Rx lexapro 10 mg #30 take 1 daily with 6 refills   Follow up in 4 weeks or before if needed Review handout on postpartum depression and depression Try to nap when kids do and take time for self

## 2014-12-22 LAB — COMPREHENSIVE METABOLIC PANEL
ALBUMIN: 4.5 g/dL (ref 3.5–5.5)
ALK PHOS: 47 IU/L (ref 39–117)
ALT: 7 IU/L (ref 0–32)
AST: 12 IU/L (ref 0–40)
Albumin/Globulin Ratio: 1.6 (ref 1.1–2.5)
BUN/Creatinine Ratio: 14 (ref 8–20)
BUN: 12 mg/dL (ref 6–20)
Bilirubin Total: 1 mg/dL (ref 0.0–1.2)
CO2: 22 mmol/L (ref 18–29)
Calcium: 9.7 mg/dL (ref 8.7–10.2)
Chloride: 104 mmol/L (ref 97–108)
Creatinine, Ser: 0.86 mg/dL (ref 0.57–1.00)
GFR calc Af Amer: 112 mL/min/{1.73_m2} (ref >59)
GFR calc non Af Amer: 98 mL/min/{1.73_m2} (ref >59)
GLOBULIN, TOTAL: 2.8 g/dL (ref 1.5–4.5)
GLUCOSE: 96 mg/dL (ref 65–99)
Potassium: 4.3 mmol/L (ref 3.5–5.2)
Sodium: 141 mmol/L (ref 134–144)
Total Protein: 7.3 g/dL (ref 6.0–8.5)

## 2014-12-22 LAB — TSH: TSH: 1.07 u[IU]/mL (ref 0.450–4.500)

## 2014-12-22 LAB — CBC
HEMOGLOBIN: 12.6 g/dL (ref 11.1–15.9)
Hematocrit: 39.5 % (ref 34.0–46.6)
MCH: 25.1 pg — ABNORMAL LOW (ref 26.6–33.0)
MCHC: 31.9 g/dL (ref 31.5–35.7)
MCV: 79 fL (ref 79–97)
PLATELETS: 281 10*3/uL (ref 150–379)
RBC: 5.02 x10E6/uL (ref 3.77–5.28)
RDW: 14.6 % (ref 12.3–15.4)
WBC: 5.4 10*3/uL (ref 3.4–10.8)

## 2014-12-25 ENCOUNTER — Telehealth: Payer: Self-pay | Admitting: Adult Health

## 2014-12-25 NOTE — Telephone Encounter (Signed)
Pt aware of labs and that fatigue may be due to depression ,take meds

## 2015-01-17 ENCOUNTER — Ambulatory Visit: Payer: Medicaid Other | Admitting: Adult Health

## 2015-01-21 ENCOUNTER — Ambulatory Visit: Payer: Medicaid Other | Admitting: Adult Health

## 2015-01-31 ENCOUNTER — Emergency Department (HOSPITAL_COMMUNITY)
Admission: EM | Admit: 2015-01-31 | Discharge: 2015-02-01 | Disposition: A | Discharge status: Home / Self Care | Payer: Medicaid Other | Attending: Emergency Medicine | Admitting: Emergency Medicine

## 2015-01-31 ENCOUNTER — Encounter (HOSPITAL_COMMUNITY): Payer: Self-pay | Admitting: Emergency Medicine

## 2015-01-31 DIAGNOSIS — Z79899 Other long term (current) drug therapy: Secondary | ICD-10-CM | POA: Diagnosis not present

## 2015-01-31 DIAGNOSIS — Z8679 Personal history of other diseases of the circulatory system: Secondary | ICD-10-CM | POA: Insufficient documentation

## 2015-01-31 DIAGNOSIS — R51 Headache: Secondary | ICD-10-CM | POA: Diagnosis not present

## 2015-01-31 DIAGNOSIS — Z793 Long term (current) use of hormonal contraceptives: Secondary | ICD-10-CM | POA: Diagnosis not present

## 2015-01-31 DIAGNOSIS — R42 Dizziness and giddiness: Secondary | ICD-10-CM | POA: Diagnosis present

## 2015-01-31 DIAGNOSIS — Z8619 Personal history of other infectious and parasitic diseases: Secondary | ICD-10-CM | POA: Insufficient documentation

## 2015-01-31 DIAGNOSIS — Z3202 Encounter for pregnancy test, result negative: Secondary | ICD-10-CM | POA: Diagnosis not present

## 2015-01-31 DIAGNOSIS — N926 Irregular menstruation, unspecified: Secondary | ICD-10-CM | POA: Insufficient documentation

## 2015-01-31 LAB — URINALYSIS, ROUTINE W REFLEX MICROSCOPIC
Bilirubin Urine: NEGATIVE
GLUCOSE, UA: NEGATIVE mg/dL
Hgb urine dipstick: NEGATIVE
Ketones, ur: NEGATIVE mg/dL
Nitrite: NEGATIVE
PH: 6 (ref 5.0–8.0)
Protein, ur: NEGATIVE mg/dL
SPECIFIC GRAVITY, URINE: 1.01 (ref 1.005–1.030)
Urobilinogen, UA: 0.2 mg/dL (ref 0.0–1.0)

## 2015-01-31 LAB — URINE MICROSCOPIC-ADD ON

## 2015-01-31 LAB — POC URINE PREG, ED: Preg Test, Ur: NEGATIVE

## 2015-01-31 NOTE — ED Notes (Signed)
Dr Wickline at bedside,  

## 2015-01-31 NOTE — ED Notes (Signed)
Pt. Reports c/o dizziness and "feeling cold".

## 2015-01-31 NOTE — ED Provider Notes (Signed)
CSN: [id number]   Arrival date & time 01/31/15 2130  History  This chart was scribed for  Ripley Fraise, MD by Altamease Oiler, ED Scribe. This patient was seen in room APA05/[facility]05 and the patient's care was started at 11:08 PM.  Chief Complaint  Patient presents with  . Dizziness    The history is provided by the patient. No language interpreter was used.   Ruth Dunlap is a 19 y.o. female who presents to the Emergency Department complaining of an episode of light-headedness with onset around 9:30 PM after eating. Pt states that she got cold and felt like she was going to pass out. The episode lasted for around 5 minutes. Associated symptoms include intermittent headache. No LOC, fever, numbness, sore throat, vomiting, SOB, chest pain, abdominal pain, back pain, diarrhea, dysuria, vaginal bleeding, or hematochezia. No cardiac or neuro history. Pt denies drug use. Her menstrual period are irregular due to Depo Provera use.   Past Medical History  Diagnosis Date  . Migraine with aura   . Supervision of other normal pregnancy 01/03/2014  . Chlamydia     Past Surgical History  Procedure Laterality Date  . No past surgeries      Family History  Problem Relation Age of Onset  . Diabetes Maternal Grandmother   . Hypertension Maternal Grandmother     History  Substance Use Topics  . Smoking status: Never Smoker   . Smokeless tobacco: Never Used  . Alcohol Use: No     Review of Systems  Constitutional: Negative for fever.  HENT: Negative for sore throat.   Respiratory: Negative for cough and shortness of breath.   Cardiovascular: Negative for chest pain.  Gastrointestinal: Negative for nausea, vomiting, abdominal pain and blood in stool.  Musculoskeletal: Negative for back pain.  Neurological: Positive for dizziness and headaches. Negative for tingling, loss of consciousness and weakness.  All other systems reviewed and are negative.   Home Medications   Prior to Admission  medications   Medication Sig Start Date End Date Taking? Authorizing Provider  escitalopram (LEXAPRO) 10 MG tablet Take 1 tablet (10 mg total) by mouth daily. 12/21/14  Yes Estill Dooms, NP  ibuprofen (ADVIL,MOTRIN) 800 MG tablet Take 800 mg by mouth 3 (three) times daily as needed for mild pain.  01/22/15  Yes Historical Provider, MD  medroxyPROGESTERone (DEPO-PROVERA) 150 MG/ML injection Inject 1 mL (150 mg total) into the muscle every 3 (three) months. 08/15/14   Roma Schanz, CNM    Allergies  Reglan  Triage Vitals: BP 113/70 mmHg  Pulse 116  Temp(Src) 98.8 F (37.1 C) (Oral)  Resp 16  Ht 5\' 6"  (1.676 m)  Wt 149 lb (67.586 kg)  BMI 24.06 kg/m2  SpO2 100%  Physical Exam CONSTITUTIONAL: Well developed/well nourished HEAD: Normocephalic/atraumatic EYES: EOMI/PERRL, no nystagmus, no ptosis ENMT: Mucous membranes moist NECK: supple no meningeal signs, no bruits SPINE/BACK:entire spine nontender CV: S1/S2 noted, no murmurs/rubs/gallops noted LUNGS: Lungs are clear to auscultation bilaterally, no apparent distress ABDOMEN: soft, nontender, no rebound or guarding GU:no cva tenderness NEURO:Awake/alert, facies symmetric, no arm or leg drift is noted Equal 5/5 strength with shoulder abduction, elbow flex/extension, wrist flex/extension in upper extremities and equal hand grips bilaterally Equal 5/5 strength with hip flexion,knee flex/extension, foot dorsi/plantar flexion Cranial nerves 3/4/5/6/02/01/09/11/12 tested and intact Gait normal without ataxia No past pointing Sensation to light touch intact in all extremities EXTREMITIES: pulses normal, full ROM SKIN: warm, color normal PSYCH: no abnormalities of mood  noted, alert and oriented to situation    ED Course  Procedures   DIAGNOSTIC STUDIES: Oxygen Saturation is 100% on RA, normal by my interpretation.    COORDINATION OF CARE: 11:13 PM Discussed treatment plan which includes UA and EKG with pt at bedside and pt  agreed to plan.  Ekg/labs reassuring Pt reports improvement She is ambulatory without difficulty She would like to go home   Labs Review-  [place] (NOT AT [facility]) - Abnormal; Notable for the following:    APPearance HAZY (*)    Leukocytes, UA TRACE (*)    All other components within normal limits  URINE MICROSCOPIC-ADD ON - Abnormal; Notable for the following:    Squamous Epithelial / LPF MANY (*)    Bacteria, UA FEW (*)    All other components within normal limits  POC URINE PREG, ED     EKG Interpretation  Date/Time:  Thursday January 31 2015 23:28:58 EDT Ventricular Rate:  90 PR Interval:  122 QRS Duration: 74 QT Interval:  338 QTC Calculation: 413 R Axis:   81 Text Interpretation:  Sinus rhythm Normal ECG No significant change since last tracing Confirmed by Christy Gentles  MD, Elenore Rota ([id number]) on 01/31/2015 11:41:26 PM    MDM   Final diagnoses:  Dizziness     Nursing notes including past medical history and social history reviewed and considered in documentation Labs/vital reviewed myself and considered during evaluation   I personally performed the services described in this documentation, which was scribed in my presence. The recorded information has been reviewed and is accurate.     Ripley Fraise, MD 02/01/15 [phone]

## 2015-02-01 NOTE — Discharge Instructions (Signed)

## 2015-02-04 ENCOUNTER — Ambulatory Visit: Payer: Medicaid Other

## 2015-02-04 ENCOUNTER — Encounter: Payer: Self-pay | Admitting: *Deleted

## 2015-02-11 ENCOUNTER — Ambulatory Visit (INDEPENDENT_AMBULATORY_CARE_PROVIDER_SITE_OTHER): Payer: Medicaid Other | Admitting: *Deleted

## 2015-02-11 ENCOUNTER — Encounter: Payer: Self-pay | Admitting: *Deleted

## 2015-02-11 DIAGNOSIS — Z3042 Encounter for surveillance of injectable contraceptive: Secondary | ICD-10-CM

## 2015-02-11 DIAGNOSIS — Z3202 Encounter for pregnancy test, result negative: Secondary | ICD-10-CM

## 2015-02-11 LAB — POCT URINE PREGNANCY: PREG TEST UR: NEGATIVE

## 2015-02-11 MED ORDER — MEDROXYPROGESTERONE ACETATE 150 MG/ML IM SUSP
150.0000 mg | Freq: Once | INTRAMUSCULAR | Status: AC
  Administered 2015-02-11: 150 mg via INTRAMUSCULAR

## 2015-02-11 NOTE — Progress Notes (Signed)
Pt here for Depo. Reports no problems at this time. Return in 12 weeks for next shot. JSY 

## 2015-05-06 ENCOUNTER — Ambulatory Visit: Payer: Medicaid Other

## 2015-06-26 ENCOUNTER — Encounter (HOSPITAL_COMMUNITY): Payer: Self-pay | Admitting: *Deleted

## 2015-06-26 ENCOUNTER — Emergency Department (HOSPITAL_COMMUNITY)
Admission: EM | Admit: 2015-06-26 | Discharge: 2015-06-26 | Disposition: A | Discharge status: Home / Self Care | Payer: Medicaid Other | Attending: Emergency Medicine | Admitting: Emergency Medicine

## 2015-06-26 ENCOUNTER — Emergency Department (HOSPITAL_COMMUNITY): Payer: Medicaid Other

## 2015-06-26 DIAGNOSIS — R079 Chest pain, unspecified: Secondary | ICD-10-CM | POA: Insufficient documentation

## 2015-06-26 DIAGNOSIS — Z8619 Personal history of other infectious and parasitic diseases: Secondary | ICD-10-CM | POA: Diagnosis not present

## 2015-06-26 DIAGNOSIS — F410 Panic disorder [episodic paroxysmal anxiety] without agoraphobia: Secondary | ICD-10-CM | POA: Insufficient documentation

## 2015-06-26 DIAGNOSIS — Z8679 Personal history of other diseases of the circulatory system: Secondary | ICD-10-CM | POA: Insufficient documentation

## 2015-06-26 DIAGNOSIS — R0602 Shortness of breath: Secondary | ICD-10-CM | POA: Diagnosis present

## 2015-06-26 LAB — I-STAT CHEM 8, ED
BUN: 12 mg/dL (ref 6–20)
CHLORIDE: 104 mmol/L (ref 101–111)
Calcium, Ion: 1.24 mmol/L — ABNORMAL HIGH (ref 1.12–1.23)
Creatinine, Ser: 0.6 mg/dL (ref 0.44–1.00)
Glucose, Bld: 92 mg/dL (ref 65–99)
HEMATOCRIT: 39 % (ref 36.0–46.0)
Hemoglobin: 13.3 g/dL (ref 12.0–15.0)
Potassium: 3.7 mmol/L (ref 3.5–5.1)
SODIUM: 141 mmol/L (ref 135–145)
TCO2: 24 mmol/L (ref 0–100)

## 2015-06-26 LAB — I-STAT BETA HCG BLOOD, ED (MC, WL, AP ONLY)

## 2015-06-26 NOTE — ED Provider Notes (Signed)
CSN: [id number]     Arrival date & time 06/26/15  1653 History   First MD Initiated Contact with Patient 06/26/15 1739     Chief Complaint  Patient presents with  . Panic Attack   HPI Patient presents to the emergency room with complaints of sharp central chest pain that started about an hour ago when she was driving. She suddenly felt anxious and developed chest pain associated with shortness of breath. Symptoms have persisted although they are actually getting better at this point. Patient does have history of panic attacks. This feels similar to that. She denies any trouble with leg swelling. No fevers or coughing. No history of heart disease, PE or DVT. Past Medical History  Diagnosis Date  . Migraine with aura   . Supervision of other normal pregnancy 01/03/2014  . Chlamydia    Past Surgical History  Procedure Laterality Date  . No past surgeries     Family History  Problem Relation Age of Onset  . Diabetes Maternal Grandmother   . Hypertension Maternal Grandmother    Social History  Substance Use Topics  . Smoking status: Never Smoker   . Smokeless tobacco: Never Used  . Alcohol Use: No   OB History    Gravida Para Term Preterm AB TAB SAB Ectopic Multiple Living   2 2 1 1  0 0 0 0 0 2     Review of Systems  All other systems reviewed and are negative.     Allergies  Reglan  Home Medications   Prior to Admission medications   Medication Sig Start Date End Date Taking? Authorizing Provider  escitalopram (LEXAPRO) 10 MG tablet Take 1 tablet (10 mg total) by mouth daily. Patient not taking: Reported on 06/26/2015 12/21/14   Estill Dooms, NP  ibuprofen (ADVIL,MOTRIN) 800 MG tablet Take 800 mg by mouth 3 (three) times daily as needed for mild pain.  01/22/15   Historical Provider, MD  medroxyPROGESTERone (DEPO-PROVERA) 150 MG/ML injection Inject 1 mL (150 mg total) into the muscle every 3 (three) months. 08/15/14   Roma Schanz, CNM   BP 132/67 mmHg  Pulse  91  Temp(Src) 98 F (36.7 C) (Oral)  Resp 20  Ht 5\' 6"  (1.676 m)  Wt 68.04 kg  BMI 24.22 kg/m2  SpO2 100%  LMP 06/16/2015 Physical Exam  Constitutional: She appears well-developed and well-nourished. No distress.  HENT:  Head: Normocephalic and atraumatic.  Right Ear: External ear normal.  Left Ear: External ear normal.  Eyes: Conjunctivae are normal. Right eye exhibits no discharge. Left eye exhibits no discharge. No scleral icterus.  Neck: Neck supple. No tracheal deviation present.  Cardiovascular: Normal rate, regular rhythm and intact distal pulses.   Pulmonary/Chest: Effort normal and breath sounds normal. No stridor. No respiratory distress. She has no wheezes. She has no rales.  Abdominal: Soft. Bowel sounds are normal. She exhibits no distension. There is no tenderness. There is no rebound and no guarding.  Musculoskeletal: She exhibits no edema or tenderness.  Neurological: She is alert. She has normal strength. No cranial nerve deficit (no facial droop, extraocular movements intact, no slurred speech) or sensory deficit. She exhibits normal muscle tone. She displays no seizure activity. Coordination normal.  Skin: Skin is warm and dry. No rash noted.  Psychiatric: She has a normal mood and affect.  Nursing note and vitals reviewed.   ED Course  Procedures (including critical care time) Labs Review Labs Reviewed  I-STAT CHEM 8, ED - Abnormal;  Notable for the following:    Calcium, Ion 1.24 (*)    All other components within normal limits  I-STAT BETA HCG BLOOD, ED ([facility], [facility], [facility] ONLY)    Imaging Review Dg Chest 2 View  06/26/2015  CLINICAL DATA:  Mid chest pain since this afternoon with shortness of breath. EXAM: CHEST  2 VIEW COMPARISON:  November 21, 2014, June 22, 2012 FINDINGS: The heart size and mediastinal contours are within normal limits. There is no focal infiltrate, pulmonary edema, or pleural effusion. Stable calcified granuloma is identified in the left  upper lobe unchanged compared to prior exam. The visualized skeletal structures are unremarkable. IMPRESSION: No active cardiopulmonary disease. Electronically Signed   By: Abelardo Diesel M.D.   On: 06/26/2015 18:29   I have personally reviewed and evaluated these images and lab results as part of my medical decision-making.   EKG Interpretation   Date/Time:  Wednesday June 26 2015 18:05:01 EST Ventricular Rate:  84 PR Interval:  150 QRS Duration: 87 QT Interval:  365 QTC Calculation: 431 R Axis:   70 Text Interpretation:  Sinus rhythm No significant change since last  tracing Confirmed by Marcheta Horsey  MD-J, Adelayde Minney [id number]) on 06/26/2015 6:22:48 PM      MDM   Final diagnoses:  Panic attack   Doubt PE or ACS. Sx are suggestive of a panic attack.  Sx resolved without intervention. Discussed outpatient follow up with PCP or consider seeing a therapist.      Dorie Rank, MD 06/26/15 [phone]

## 2015-06-26 NOTE — ED Notes (Signed)
MD Knapp at bedside 

## 2015-06-26 NOTE — ED Notes (Signed)
MD Knapp at bedside updating patient and family.  

## 2015-06-26 NOTE — Discharge Instructions (Signed)

## 2015-06-26 NOTE — ED Notes (Addendum)
Patient reports driving home and suddenly felt anxious with shortness of breath and chest pain. Reports hx of panic attack. NAD noted in triage. Denies pain at this time.

## 2015-06-27 DIAGNOSIS — W3400XA Accidental discharge from unspecified firearms or gun, initial encounter: Secondary | ICD-10-CM

## 2015-06-27 HISTORY — DX: Accidental discharge from unspecified firearms or gun, initial encounter: W34.00XA

## 2015-07-13 ENCOUNTER — Emergency Department (HOSPITAL_COMMUNITY): Payer: Medicaid Other | Admitting: Anesthesiology

## 2015-07-13 ENCOUNTER — Inpatient Hospital Stay (HOSPITAL_COMMUNITY)
Admission: EM | Admit: 2015-07-13 | Discharge: 2015-07-25 | DRG: 957 | Disposition: A | Discharge status: Home Health | Payer: Medicaid Other | Attending: General Surgery | Admitting: General Surgery

## 2015-07-13 ENCOUNTER — Emergency Department (HOSPITAL_COMMUNITY): Payer: Medicaid Other

## 2015-07-13 ENCOUNTER — Encounter (HOSPITAL_COMMUNITY): Payer: Self-pay | Admitting: Emergency Medicine

## 2015-07-13 ENCOUNTER — Encounter (HOSPITAL_COMMUNITY): Admission: EM | Disposition: A | Discharge status: Home Health | Payer: Self-pay | Source: Home / Self Care

## 2015-07-13 DIAGNOSIS — S36231A Laceration of body of pancreas, unspecified degree, initial encounter: Secondary | ICD-10-CM | POA: Diagnosis present

## 2015-07-13 DIAGNOSIS — D72829 Elevated white blood cell count, unspecified: Secondary | ICD-10-CM | POA: Diagnosis not present

## 2015-07-13 DIAGNOSIS — T794XXA Traumatic shock, initial encounter: Secondary | ICD-10-CM | POA: Diagnosis present

## 2015-07-13 DIAGNOSIS — S36039A Unspecified laceration of spleen, initial encounter: Secondary | ICD-10-CM | POA: Diagnosis present

## 2015-07-13 DIAGNOSIS — E162 Hypoglycemia, unspecified: Secondary | ICD-10-CM | POA: Diagnosis not present

## 2015-07-13 DIAGNOSIS — D62 Acute posthemorrhagic anemia: Secondary | ICD-10-CM | POA: Diagnosis present

## 2015-07-13 DIAGNOSIS — W3400XA Accidental discharge from unspecified firearms or gun, initial encounter: Secondary | ICD-10-CM

## 2015-07-13 DIAGNOSIS — S31649A Puncture wound with foreign body of abdominal wall, unspecified quadrant with penetration into peritoneal cavity, initial encounter: Secondary | ICD-10-CM | POA: Diagnosis present

## 2015-07-13 DIAGNOSIS — J9690 Respiratory failure, unspecified, unspecified whether with hypoxia or hypercapnia: Secondary | ICD-10-CM

## 2015-07-13 DIAGNOSIS — S21139A Puncture wound without foreign body of unspecified front wall of thorax without penetration into thoracic cavity, initial encounter: Secondary | ICD-10-CM | POA: Diagnosis present

## 2015-07-13 DIAGNOSIS — R112 Nausea with vomiting, unspecified: Secondary | ICD-10-CM | POA: Diagnosis not present

## 2015-07-13 DIAGNOSIS — S3633XA Laceration of stomach, initial encounter: Secondary | ICD-10-CM | POA: Diagnosis present

## 2015-07-13 DIAGNOSIS — J9600 Acute respiratory failure, unspecified whether with hypoxia or hypercapnia: Secondary | ICD-10-CM | POA: Diagnosis present

## 2015-07-13 DIAGNOSIS — S3630XA Unspecified injury of stomach, initial encounter: Secondary | ICD-10-CM | POA: Diagnosis present

## 2015-07-13 DIAGNOSIS — J189 Pneumonia, unspecified organism: Secondary | ICD-10-CM | POA: Diagnosis not present

## 2015-07-13 DIAGNOSIS — S37009A Unspecified injury of unspecified kidney, initial encounter: Secondary | ICD-10-CM | POA: Diagnosis present

## 2015-07-13 DIAGNOSIS — R000 Tachycardia, unspecified: Secondary | ICD-10-CM | POA: Diagnosis not present

## 2015-07-13 DIAGNOSIS — W320XXA Accidental handgun discharge, initial encounter: Secondary | ICD-10-CM | POA: Diagnosis not present

## 2015-07-13 DIAGNOSIS — S31102A Unspecified open wound of abdominal wall, epigastric region without penetration into peritoneal cavity, initial encounter: Secondary | ICD-10-CM | POA: Diagnosis present

## 2015-07-13 DIAGNOSIS — S36892A Contusion of other intra-abdominal organs, initial encounter: Secondary | ICD-10-CM | POA: Diagnosis present

## 2015-07-13 DIAGNOSIS — K661 Hemoperitoneum: Secondary | ICD-10-CM | POA: Diagnosis present

## 2015-07-13 DIAGNOSIS — S36209A Unspecified injury of unspecified part of pancreas, initial encounter: Secondary | ICD-10-CM | POA: Diagnosis present

## 2015-07-13 DIAGNOSIS — S37032A Laceration of left kidney, unspecified degree, initial encounter: Secondary | ICD-10-CM | POA: Diagnosis present

## 2015-07-13 DIAGNOSIS — S36113A Laceration of liver, unspecified degree, initial encounter: Secondary | ICD-10-CM | POA: Diagnosis present

## 2015-07-13 DIAGNOSIS — S31641A Puncture wound with foreign body of abdominal wall, left upper quadrant with penetration into peritoneal cavity, initial encounter: Principal | ICD-10-CM | POA: Diagnosis present

## 2015-07-13 DIAGNOSIS — S36119A Unspecified injury of liver, initial encounter: Secondary | ICD-10-CM | POA: Diagnosis present

## 2015-07-13 HISTORY — PX: LAPAROTOMY: SHX154

## 2015-07-13 LAB — LACTIC ACID, PLASMA
Lactic Acid, Venous: 2.5 mmol/L (ref 0.5–2.0)
Lactic Acid, Venous: 2.8 mmol/L (ref 0.5–2.0)

## 2015-07-13 LAB — BLOOD GAS, ARTERIAL
Acid-base deficit: 8.7 mmol/L — ABNORMAL HIGH (ref 0.0–2.0)
BICARBONATE: 16.9 meq/L — AB (ref 20.0–24.0)
DRAWN BY: 245131
FIO2: 0.4
LHR: 16 {breaths}/min
MECHVT: 450 mL
O2 SAT: 99.2 %
PATIENT TEMPERATURE: 96.2
PCO2 ART: 36 mmHg (ref 35.0–45.0)
PEEP: 5 cmH2O
PO2 ART: 203 mmHg — AB (ref 80.0–100.0)
TCO2: 18.1 mmol/L (ref 0–100)
pH, Arterial: 7.285 — ABNORMAL LOW (ref 7.350–7.450)

## 2015-07-13 LAB — COMPREHENSIVE METABOLIC PANEL
ALBUMIN: 2.9 g/dL — AB (ref 3.5–5.0)
ALK PHOS: 30 U/L — AB (ref 38–126)
ALK PHOS: 45 U/L (ref 38–126)
ALT: 112 U/L — AB (ref 14–54)
ALT: 119 U/L — AB (ref 14–54)
AST: 136 U/L — AB (ref 15–41)
AST: 150 U/L — AB (ref 15–41)
Albumin: 1.8 g/dL — ABNORMAL LOW (ref 3.5–5.0)
Anion gap: 8 (ref 5–15)
Anion gap: 5 (ref 5–15)
BILIRUBIN TOTAL: 0.6 mg/dL (ref 0.3–1.2)
BUN: 12 mg/dL (ref 6–20)
CALCIUM: 6.8 mg/dL — AB (ref 8.9–10.3)
CALCIUM: 8.4 mg/dL — AB (ref 8.9–10.3)
CHLORIDE: 114 mmol/L — AB (ref 101–111)
CO2: 21 mmol/L — ABNORMAL LOW (ref 22–32)
CO2: 18 mmol/L — AB (ref 22–32)
CREATININE: 0.81 mg/dL (ref 0.44–1.00)
CREATININE: 0.87 mg/dL (ref 0.44–1.00)
Chloride: 110 mmol/L (ref 101–111)
GFR calc Af Amer: >60 mL/min (ref >60)
GFR calc Af Amer: >60 mL/min (ref >60)
GFR calc non Af Amer: >60 mL/min (ref >60)
Glucose, Bld: 167 mg/dL — ABNORMAL HIGH (ref 65–99)
Glucose, Bld: 169 mg/dL — ABNORMAL HIGH (ref 65–99)
Potassium: 3.3 mmol/L — ABNORMAL LOW (ref 3.5–5.1)
Potassium: 4.7 mmol/L (ref 3.5–5.1)
SODIUM: 137 mmol/L (ref 135–145)
SODIUM: 139 mmol/L (ref 135–145)
TOTAL PROTEIN: 5.6 g/dL — AB (ref 6.5–8.1)
Total Bilirubin: 0.9 mg/dL (ref 0.3–1.2)
Total Protein: 3.1 g/dL — ABNORMAL LOW (ref 6.5–8.1)

## 2015-07-13 LAB — PROTIME-INR
INR: 1.18 (ref 0.00–1.49)
INR: 1.47 (ref 0.00–1.49)
PROTHROMBIN TIME: 15.2 s (ref 11.6–15.2)
Prothrombin Time: 17.9 seconds — ABNORMAL HIGH (ref 11.6–15.2)

## 2015-07-13 LAB — PREPARE FRESH FROZEN PLASMA
UNIT DIVISION: 0
Unit division: 0

## 2015-07-13 LAB — CBC
HEMATOCRIT: 31.2 % — AB (ref 36.0–46.0)
HEMATOCRIT: 31.5 % — AB (ref 36.0–46.0)
HEMOGLOBIN: 10.7 g/dL — AB (ref 12.0–15.0)
Hemoglobin: 10 g/dL — ABNORMAL LOW (ref 12.0–15.0)
MCH: 26.2 pg (ref 26.0–34.0)
MCH: 28.2 pg (ref 26.0–34.0)
MCHC: 32.1 g/dL (ref 30.0–36.0)
MCHC: 34 g/dL (ref 30.0–36.0)
MCV: 81.7 fL (ref 78.0–100.0)
MCV: 82.9 fL (ref 78.0–100.0)
PLATELETS: 250 10*3/uL (ref 150–400)
Platelets: 154 10*3/uL (ref 150–400)
RBC: 3.8 MIL/uL — AB (ref 3.87–5.11)
RBC: 3.82 MIL/uL — ABNORMAL LOW (ref 3.87–5.11)
RDW: 13.3 % (ref 11.5–15.5)
RDW: 15.4 % (ref 11.5–15.5)
WBC: 11 10*3/uL — AB (ref 4.0–10.5)
WBC: 15.4 10*3/uL — AB (ref 4.0–10.5)

## 2015-07-13 LAB — PREPARE RBC (CROSSMATCH)

## 2015-07-13 LAB — MRSA PCR SCREENING: MRSA by PCR: NEGATIVE

## 2015-07-13 LAB — ETHANOL

## 2015-07-13 LAB — TRIGLYCERIDES: Triglycerides: 135 mg/dL (ref <150)

## 2015-07-13 LAB — CDS SEROLOGY

## 2015-07-13 LAB — ABO/RH: ABO/RH(D): AB POS

## 2015-07-13 LAB — HCG, QUANTITATIVE, PREGNANCY: hCG, Beta Chain, Quant, S: <1 m[IU]/mL (ref <5)

## 2015-07-13 LAB — APTT: aPTT: 23 seconds — ABNORMAL LOW (ref 24–37)

## 2015-07-13 SURGERY — LAPAROTOMY, EXPLORATORY
Anesthesia: General | Site: Abdomen

## 2015-07-13 MED ORDER — FENTANYL CITRATE (PF) 100 MCG/2ML IJ SOLN: 100.0000 ug | INTRAMUSCULAR | Status: DC | PRN

## 2015-07-13 MED ORDER — CHLORHEXIDINE GLUCONATE 0.12% ORAL RINSE (MEDLINE KIT): 15.0000 mL | Freq: Two times a day (BID) | OROMUCOSAL | Status: DC

## 2015-07-13 MED ORDER — FENTANYL CITRATE (PF) 250 MCG/5ML IJ SOLN
INTRAMUSCULAR | Status: AC
  Filled 2015-07-13: qty 5

## 2015-07-13 MED ORDER — LACTATED RINGERS IV SOLN
INTRAVENOUS | Status: DC | PRN
  Administered 2015-07-13: 01:00:00 via INTRAVENOUS

## 2015-07-13 MED ORDER — CEFAZOLIN SODIUM-DEXTROSE 2-3 GM-% IV SOLR
INTRAVENOUS | Status: DC | PRN
  Administered 2015-07-13: 2 g via INTRAVENOUS

## 2015-07-13 MED ORDER — PHENYLEPHRINE HCL 10 MG/ML IJ SOLN
0.0000 ug/min | INTRAVENOUS | Status: DC
  Administered 2015-07-14: 15 ug/min via INTRAVENOUS
  Administered 2015-07-14 (×2): 20 ug/min via INTRAVENOUS
  Filled 2015-07-13 (×2): qty 1

## 2015-07-13 MED ORDER — DEXTROSE 5 % IV SOLN
2.0000 g | Freq: Four times a day (QID) | INTRAVENOUS | Status: AC
  Administered 2015-07-13 (×3): 2 g via INTRAVENOUS
  Filled 2015-07-13 (×3): qty 2

## 2015-07-13 MED ORDER — SODIUM CHLORIDE 0.9 % IV BOLUS (SEPSIS)
1000.0000 mL | Freq: Once | INTRAVENOUS | Status: AC
  Administered 2015-07-13: 1000 mL via INTRAVENOUS

## 2015-07-13 MED ORDER — SUCCINYLCHOLINE CHLORIDE 20 MG/ML IJ SOLN
INTRAMUSCULAR | Status: DC | PRN
  Administered 2015-07-13: 100 mg via INTRAVENOUS

## 2015-07-13 MED ORDER — PROPOFOL 1000 MG/100ML IV EMUL
0.0000 ug/kg/min | INTRAVENOUS | Status: DC
Start: 2015-07-13 — End: 2015-07-16
  Administered 2015-07-13 – 2015-07-14 (×4): 15 ug/kg/min via INTRAVENOUS
  Filled 2015-07-13 (×5): qty 100

## 2015-07-13 MED ORDER — LIDOCAINE HCL (CARDIAC) 20 MG/ML IV SOLN
INTRAVENOUS | Status: DC | PRN
  Administered 2015-07-13: 100 mg via INTRATRACHEAL

## 2015-07-13 MED ORDER — SODIUM CHLORIDE 0.9 % IV SOLN
INTRAVENOUS | Status: DC | PRN
  Administered 2015-07-13: 1000 mL via INTRAVENOUS

## 2015-07-13 MED ORDER — PROPOFOL 10 MG/ML IV BOLUS
INTRAVENOUS | Status: DC | PRN
  Administered 2015-07-13: 200 mg via INTRAVENOUS

## 2015-07-13 MED ORDER — FENTANYL CITRATE (PF) 250 MCG/5ML IJ SOLN
INTRAMUSCULAR | Status: DC | PRN
  Administered 2015-07-13: 100 ug via INTRAVENOUS
  Administered 2015-07-13: 50 ug via INTRAVENOUS
  Administered 2015-07-13: 100 ug via INTRAVENOUS

## 2015-07-13 MED ORDER — 0.9 % SODIUM CHLORIDE (POUR BTL) OPTIME
TOPICAL | Status: DC | PRN
  Administered 2015-07-13 (×5): 1000 mL

## 2015-07-13 MED ORDER — ANTISEPTIC ORAL RINSE SOLUTION (CORINZ)
7.0000 mL | Freq: Four times a day (QID) | OROMUCOSAL | Status: DC
  Administered 2015-07-13 – 2015-07-25 (×34): 7 mL via OROMUCOSAL

## 2015-07-13 MED ORDER — PANTOPRAZOLE SODIUM 40 MG IV SOLR
40.0000 mg | Freq: Every day | INTRAVENOUS | Status: DC
  Administered 2015-07-13 – 2015-07-22 (×9): 40 mg via INTRAVENOUS
  Filled 2015-07-13 (×9): qty 40

## 2015-07-13 MED ORDER — PROPOFOL 500 MG/50ML IV EMUL
INTRAVENOUS | Status: DC | PRN
  Administered 2015-07-13: 50 ug/kg/min via INTRAVENOUS

## 2015-07-13 MED ORDER — PANTOPRAZOLE SODIUM 40 MG PO TBEC
40.0000 mg | DELAYED_RELEASE_TABLET | Freq: Every day | ORAL | Status: DC
  Administered 2015-07-21 – 2015-07-23 (×2): 40 mg via ORAL
  Filled 2015-07-13 (×2): qty 1

## 2015-07-13 MED ORDER — MIDAZOLAM HCL 2 MG/2ML IJ SOLN
INTRAMUSCULAR | Status: DC | PRN
  Administered 2015-07-13: 2 mg via INTRAVENOUS

## 2015-07-13 MED ORDER — ANTISEPTIC ORAL RINSE SOLUTION (CORINZ): 7.0000 mL | Freq: Four times a day (QID) | OROMUCOSAL | Status: DC

## 2015-07-13 MED ORDER — SODIUM CHLORIDE 0.9 % IV SOLN: Freq: Once | INTRAVENOUS | Status: DC

## 2015-07-13 MED ORDER — SODIUM CHLORIDE 0.9 % IV SOLN
INTRAVENOUS | Status: DC
  Administered 2015-07-13 – 2015-07-16 (×6): via INTRAVENOUS

## 2015-07-13 MED ORDER — SODIUM CHLORIDE 0.9 % IV SOLN
50.0000 ug/h | INTRAVENOUS | Status: DC
  Administered 2015-07-13: 100 ug/h via INTRAVENOUS
  Administered 2015-07-14: 125 ug/h via INTRAVENOUS
  Administered 2015-07-14: 150 ug/h via INTRAVENOUS
  Filled 2015-07-13 (×3): qty 50

## 2015-07-13 MED ORDER — CHLORHEXIDINE GLUCONATE 0.12% ORAL RINSE (MEDLINE KIT)
15.0000 mL | Freq: Two times a day (BID) | OROMUCOSAL | Status: DC
  Administered 2015-07-13 – 2015-07-24 (×21): 15 mL via OROMUCOSAL
  Filled 2015-07-13 (×2): qty 15

## 2015-07-13 MED ORDER — MIDAZOLAM HCL 2 MG/2ML IJ SOLN
INTRAMUSCULAR | Status: AC
  Filled 2015-07-13: qty 2

## 2015-07-13 MED ORDER — PROPOFOL 10 MG/ML IV BOLUS
INTRAVENOUS | Status: AC
  Filled 2015-07-13: qty 20

## 2015-07-13 MED ORDER — SODIUM CHLORIDE 0.9 % IV SOLN
INTRAVENOUS | Status: DC | PRN
  Administered 2015-07-13 (×2): via INTRAVENOUS

## 2015-07-13 SURGICAL SUPPLY — 46 items
CHLORAPREP W/TINT 26ML (MISCELLANEOUS) ×3 IMPLANT
CLIP TI LARGE 6 (CLIP) ×3 IMPLANT
CLIP TI MEDIUM 6 (CLIP) ×3 IMPLANT
CLIP TI WIDE RED SMALL 6 (CLIP) ×3 IMPLANT
COVER SURGICAL LIGHT HANDLE (MISCELLANEOUS) ×3 IMPLANT
DRAIN CHANNEL 19F RND (DRAIN) ×4 IMPLANT
DRAPE LAPAROSCOPIC ABDOMINAL (DRAPES) ×3 IMPLANT
DRAPE WARM FLUID 44X44 (DRAPE) ×3 IMPLANT
DRSG OPSITE POSTOP 4X10 (GAUZE/BANDAGES/DRESSINGS) ×2 IMPLANT
DRSG OPSITE POSTOP 4X8 (GAUZE/BANDAGES/DRESSINGS) IMPLANT
ELECT BLADE 6.5 EXT (BLADE) ×2 IMPLANT
ELECT CAUTERY BLADE 6.4 (BLADE) ×3 IMPLANT
ELECT REM PT RETURN 9FT ADLT (ELECTROSURGICAL) ×3
ELECTRODE REM PT RTRN 9FT ADLT (ELECTROSURGICAL) ×1 IMPLANT
EVACUATOR SILICONE 100CC (DRAIN) ×4 IMPLANT
GLOVE BIOGEL PI IND STRL 7.0 (GLOVE) ×1 IMPLANT
GLOVE BIOGEL PI INDICATOR 7.0 (GLOVE) ×4
GLOVE SURG SIGNA 7.5 PF LTX (GLOVE) ×2 IMPLANT
GLOVE SURG SS PI 7.0 STRL IVOR (GLOVE) ×5 IMPLANT
GOWN STRL REUS W/ TWL LRG LVL3 (GOWN DISPOSABLE) ×2 IMPLANT
GOWN STRL REUS W/ TWL XL LVL3 (GOWN DISPOSABLE) IMPLANT
GOWN STRL REUS W/TWL LRG LVL3 (GOWN DISPOSABLE) ×6
GOWN STRL REUS W/TWL XL LVL3 (GOWN DISPOSABLE) ×3
HEMOSTAT SURGICEL 2X14 (HEMOSTASIS) ×6 IMPLANT
KIT BASIN OR (CUSTOM PROCEDURE TRAY) ×3 IMPLANT
LIGASURE IMPACT 36 18CM CVD LR (INSTRUMENTS) ×2 IMPLANT
LOOP VESSEL MAXI BLUE (MISCELLANEOUS) IMPLANT
LOOP VESSEL MINI RED (MISCELLANEOUS) IMPLANT
PACK GENERAL/GYN (CUSTOM PROCEDURE TRAY) ×3 IMPLANT
SPONGE LAP 18X18 X RAY DECT (DISPOSABLE) IMPLANT
STAPLER VISISTAT 35W (STAPLE) ×3 IMPLANT
SUCTION POOLE TIP (SUCTIONS) ×3 IMPLANT
SUT ETHILON 2 0 FS 18 (SUTURE) ×2 IMPLANT
SUT PDS AB 0 CT 36 (SUTURE) ×4 IMPLANT
SUT PDS AB 1 TP1 96 (SUTURE) IMPLANT
SUT SILK 2 0 (SUTURE) ×3
SUT SILK 2 0 SH CR/8 (SUTURE) ×3 IMPLANT
SUT SILK 2-0 18XBRD TIE 12 (SUTURE) ×1 IMPLANT
SUT SILK 3 0 (SUTURE) ×3
SUT SILK 3 0 SH CR/8 (SUTURE) ×5 IMPLANT
SUT SILK 3-0 18XBRD TIE 12 (SUTURE) ×1 IMPLANT
SUT VIC AB 2-0 SH 27 (SUTURE) ×12
SUT VIC AB 2-0 SH 27X BRD (SUTURE) IMPLANT
TOWEL OR 17X26 10 PK STRL BLUE (TOWEL DISPOSABLE) ×3 IMPLANT
TRAY FOLEY CATH 14FRSI W/METER (CATHETERS) ×3 IMPLANT
YANKAUER SUCT BULB TIP NO VENT (SUCTIONS) IMPLANT

## 2015-07-13 NOTE — Progress Notes (Signed)
CRITICAL VALUE ALERT  Critical value received:  Lactic Acid 2.5  Date of notification:  07/13/2015   Time of notification:  0427  Critical value read back:Yes.    Nurse who received alert:  Beacher May, RN  MD notified (1st page):  Dr. Kieth Brightly  Time of first page:  [phone]  MD notified (2nd page):  Time of second page:  Responding MD:  Dr. Kieth Brightly  Time MD responded:  [phone]  MD aware. No new orders received.

## 2015-07-13 NOTE — ED Notes (Signed)
Pt's belongings given to [facility] with [place] [employer].

## 2015-07-13 NOTE — ED Notes (Signed)
Pt transported to the OR accompanied by [facility] RN, Marylouise Stacks, and Kinsinger MD.
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
## 2015-07-13 NOTE — Anesthesia Preprocedure Evaluation (Addendum)
Anesthesia Evaluation  Patient identified by MRN, date of birth, ID band Patient awake    Reviewed: Allergy & Precautions, NPO status , Patient's Chart, lab work & pertinent test resultsPreop documentation limited or incomplete due to emergent nature of procedure.  Airway Mallampati: II  TM Distance: >3 FB Neck ROM: Full    Dental no notable dental hx.    Pulmonary neg pulmonary ROS,    Pulmonary exam normal breath sounds clear to auscultation       Cardiovascular negative cardio ROS Normal cardiovascular exam Rhythm:Regular Rate:Normal     Neuro/Psych negative neurological ROS  negative psych ROS   GI/Hepatic negative GI ROS, Neg liver ROS,   Endo/Other  negative endocrine ROS  Renal/GU negative Renal ROS     Musculoskeletal negative musculoskeletal ROS (+)   Abdominal   Peds  Hematology negative hematology ROS (+)   Anesthesia Other Findings   Reproductive/Obstetrics negative OB ROS                             Anesthesia Physical Anesthesia Plan  ASA: II and emergent  Anesthesia Plan: General   Post-op Pain Management:    Induction: Intravenous  Airway Management Planned: Oral ETT  Additional Equipment: Arterial line  Intra-op Plan:   Post-operative Plan: Possible Post-op intubation/ventilation  Informed Consent: I have reviewed the patients History and Physical, chart, labs and discussed the procedure including the risks, benefits and alternatives for the proposed anesthesia with the patient or authorized representative who has indicated his/her understanding and acceptance.   Dental advisory given  Plan Discussed with: CRNA  Anesthesia Plan Comments:         Anesthesia Quick Evaluation

## 2015-07-13 NOTE — Progress Notes (Addendum)
CRITICAL VALUE ALERT  Critical value received:  Lactic acid 2.8  Date of notification:  07/13/15  Time of notification:  1000  Critical value read back:Yes.    Nurse who received alert:  Shara Blazing, RN  MD notified (1st page):  Ninfa Linden  Time of first page:  1010  MD notified (2nd page):  Time of second page:  Responding MD:  Ninfa Linden  Time MD responded:  1011

## 2015-07-13 NOTE — ED Notes (Signed)
Normal saline bolus started.
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
## 2015-07-13 NOTE — Anesthesia Postprocedure Evaluation (Signed)
Anesthesia Post Note  Patient: Ruth Dunlap  Procedure(s) Performed: Procedure(s) (LRB): EXPLORATORY LAPAROTOMY,   DRAINAGE OF PANCREATIC INJURY HEMOSTASIS OF LIVER INJURY, REPAIR GASTOROTOMY, REPAIR LEFT  RENAL INJURY (N/A)  Patient location during evaluation: SICU Anesthesia Type: General Level of consciousness: sedated and patient remains intubated per anesthesia plan Vital Signs Assessment: post-procedure vital signs reviewed and stable Respiratory status: spontaneous breathing Cardiovascular status: stable Anesthetic complications: no    Last Vitals:  Filed Vitals:   07/13/15 0545 07/13/15 0608  BP: 67/37   Pulse: 115 111  Temp: 36.5 C 36.8 C  Resp: 17 19    Last Pain:  Filed Vitals:   07/13/15 0611  PainSc: [place]
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
## 2015-07-13 NOTE — Anesthesia Procedure Notes (Signed)
Procedure Name: Intubation Date/Time: 07/13/2015 12:37 AM Performed by: Valetta Fuller Pre-anesthesia Checklist: Patient identified, Emergency Drugs available, Suction available and Patient being monitored Patient Re-evaluated:Patient Re-evaluated prior to inductionOxygen Delivery Method: Circle system utilized Preoxygenation: Pre-oxygenation with 100% oxygen Intubation Type: IV induction, Rapid sequence and Cricoid Pressure applied Laryngoscope Size: Miller and 2 Tube type: Subglottic suction tube Tube size: 7.5 mm Number of attempts: 1 Airway Equipment and Method: Stylet Placement Confirmation: ETT inserted through vocal cords under direct vision,  positive ETCO2 and breath sounds checked- equal and bilateral Secured at: 22 cm Tube secured with: Tape Dental Injury: Teeth and Oropharynx as per pre-operative assessment
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
## 2015-07-13 NOTE — Transfer of Care (Signed)
Immediate Anesthesia Transfer of Care Note  Patient: Ruth Dunlap  Procedure(s) Performed: Procedure(s): EXPLORATORY LAPAROTOMY,   DRAINAGE OF PANCREATIC INJURY HEMOSTASIS OF LIVER INJURY, REPAIR GASTOROTOMY, REPAIR LEFT  RENAL INJURY (N/A)  Patient Location: NICU  Anesthesia Type:General  Level of Consciousness: Patient remains intubated per anesthesia plan  Airway & Oxygen Therapy: Patient placed on Ventilator (see vital sign flow sheet for setting)  Post-op Assessment: Report given to RN and Post -op Vital signs reviewed and stable  Post vital signs: Reviewed and stable  Last Vitals:  Filed Vitals:   07/13/15 0022 07/13/15 0023  BP:    Pulse: 90 95  Temp:    Resp: 13 19    Complications: No apparent anesthesia complications
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
## 2015-07-13 NOTE — Progress Notes (Signed)
Patient ID: Ruth Dunlap, female   DOB: July 17, 1996, 19 y.o.   MRN: [id number] Follow up - Trauma and Critical Care  Patient Details:    Ruth Dunlap is an 19 y.o. female.  Lines/tubes : Airway 7.5 mm (Active)  Secured at (cm) 22 cm 07/13/2015  8:08 AM  Measured From Lips 07/13/2015  8:08 AM  Secured Location Right 07/13/2015  8:08 AM  Secured By Pink Tape 07/13/2015  8:08 AM  Cuff Pressure (cm H2O) 26 cm H2O 07/13/2015  3:02 AM  Site Condition Dry 07/13/2015  8:08 AM     Arterial Line 07/13/15 Left Radial (Active)  Site Assessment Clean;Dry;Intact 07/13/2015  4:00 AM  Line Status Pulsatile blood flow 07/13/2015  4:00 AM  Art Line Waveform Appropriate 07/13/2015  4:00 AM  Art Line Interventions Zeroed and calibrated;Leveled;Connections checked and tightened 07/13/2015  4:00 AM  Color/Movement/Sensation Capillary refill less than 3 sec 07/13/2015  4:00 AM  Dressing Type Transparent 07/13/2015  4:00 AM  Dressing Status Clean;Dry;Intact 07/13/2015  4:00 AM     Closed System Drain 1 Left Abdomen Bulb (JP) 19 Fr. (Active)  Site Description Unable to view 07/13/2015  8:00 AM  Dressing Status Clean;Dry;Intact 07/13/2015  8:00 AM  Drainage Appearance Bloody;Bright red 07/13/2015  8:00 AM  Status To suction (Charged) 07/13/2015  8:00 AM  Output (mL) 45 mL 07/13/2015  8:00 AM     Closed System Drain 2 Left Abdomen Bulb (JP) 19 Fr. (Active)  Site Description Unable to view 07/13/2015  8:00 AM  Dressing Status Clean;Dry;Intact 07/13/2015  8:00 AM  Drainage Appearance Bloody;Bright red 07/13/2015  8:00 AM  Status To suction (Charged) 07/13/2015  8:00 AM  Output (mL) 90 mL 07/13/2015  8:00 AM     NG/OG Tube Nasogastric Right nare (Active)  Placement Verification Auscultation 07/13/2015  8:00 AM  Site Assessment Clean;Dry;Intact 07/13/2015  8:00 AM  Status Suction-low intermittent 07/13/2015  8:00 AM  Drainage Appearance Bile;Coffee ground 07/13/2015  8:00 AM     Urethral Catheter  Rexanne Mano RN Latex 16 Fr. (Active)  Indication for Insertion or Continuance of Catheter Peri-operative use for selective surgical procedure 07/13/2015  8:00 AM  Site Assessment Clean;Intact 07/13/2015  8:00 AM  Catheter Maintenance Bag below level of bladder;Catheter secured;Drainage bag/tubing not touching floor;Insertion date on drainage bag;No dependent loops;Seal intact 07/13/2015  8:00 AM  Collection Container Standard drainage bag 07/13/2015  8:00 AM  Securement Method Securing device (Describe) 07/13/2015  8:00 AM  Urinary Catheter Interventions Unclamped 07/13/2015  8:00 AM  Output (mL) 125 mL 07/13/2015  8:00 AM    Microbiology/Sepsis markers: Results for orders placed or performed during the hospital encounter of 07/13/15  MRSA PCR Screening     Status: None   Collection Time: 07/13/15  5:09 AM  Result Value Ref Range Status   MRSA by PCR NEGATIVE NEGATIVE Final    Comment:        The GeneXpert MRSA Assay (FDA approved for NASAL specimens only), is one component of [facility] comprehensive MRSA colonization surveillance program. It is not intended to diagnose MRSA infection nor to guide or monitor treatment for MRSA infections.     Anti-infectives:  Anti-infectives    Start     Dose/Rate Route Frequency Ordered Stop   07/13/15 0600  cefOXitin (MEFOXIN) 2 g in dextrose 5 % 50 mL IVPB     2 g 100 mL/hr over 30 Minutes Intravenous 4 times per day 07/13/15 0300 07/13/15 2359  Best Practice/Protocols:     Consults: Treatment Team:  Trauma Md, MD    Events:  Subjective:    Overnight Issues: On vent. Hemodynamically stable since OR Will wake up and follow commands per nurse's evaluation  Objective:  Vital signs for last 24 hours: Temp:  [95.6 F (35.3 C)-98.3 F (36.8 C)] 97.7 F (36.5 C) (12/17 0715) Pulse Rate:  [90-115] 113 (12/17 0808) Resp:  [13-23] 16 (12/17 0808) BP: (67-123)/(37-75) 123/62 mmHg (12/17 0808) SpO2:  [99 %-100 %] 100 % (12/17  0808) Arterial Line BP: (70-145)/(41-68) 115/58 mmHg (12/17 0800) FiO2 (%):  [40 %-50 %] 40 % (12/17 0808) Weight:  [83.915 kg (185 lb)] 83.915 kg (185 lb) (12/17 0051)  Hemodynamic parameters for last 24 hours:    Intake/Output from previous day: 12/16 0701 - 12/17 0700 In: 7448.1 [I.V.:4962.3; Blood:1485.8; IV Piggyback:1000] Out: 300 [Urine:70; Drains:230]  Intake/Output this shift: Total I/O In: 7851.6 [I.V.:7801.6; IV Piggyback:50] Out: 260 [Urine:125; Drains:135]  Vent settings for last 24 hours: Vent Mode:  [-] PRVC FiO2 (%):  [40 %-50 %] 40 % Set Rate:  [16 bmp] 16 bmp Vt Set:  [450 mL] 450 mL PEEP:  [5 cmH20] 5 cmH20 Plateau Pressure:  [11 cmH20-16 cmH20] 11 cmH20  Physical Exam:   Intubated and sedated Lungs clear CV tachy Abdomen soft, drains with old blood, minimal distension  Results for orders placed or performed during the hospital encounter of 07/13/15 (from the past 24 hour(s))  Prepare fresh frozen plasma     Status: None   Collection Time: 07/12/15 11:45 PM  Result Value Ref Range   Unit Number [id number]    Blood Component Type LIQ PLASMA    Unit division 00    Status of Unit REL FROM [facility]    Unit tag comment VERBAL ORDERS PER DR ONI    Transfusion Status OK TO TRANSFUSE    Unit Number [id number]    Blood Component Type LIQ PLASMA    Unit division 00    Status of Unit REL FROM [facility]    Unit tag comment VERBAL ORDERS PER DR ONI    Transfusion Status OK TO TRANSFUSE   Type and screen     Status: None (Preliminary result)   Collection Time: 07/13/15 12:22 AM  Result Value Ref Range   ABO/RH(D) AB POS    Antibody Screen NEG    Sample Expiration 07/15/2015    Unit Number [id number]    Blood Component Type RED CELLS,LR    Unit division 00    Status of Unit REL FROM [facility]    Unit tag comment VERBAL ORDERS PER DR ONI    Transfusion Status OK TO TRANSFUSE    Crossmatch Result COMPATIBLE    Unit Number [id number]    Blood Component  Type RED CELLS,LR    Unit division 00    Status of Unit REL FROM [facility]    Unit tag comment VERBAL ORDERS PER DR ONI    Transfusion Status OK TO TRANSFUSE    Crossmatch Result COMPATIBLE    Unit Number [id number]    Blood Component Type RED CELLS,LR    Unit division 00    Status of Unit ALLOCATED    Transfusion Status OK TO TRANSFUSE    Crossmatch Result Compatible    Unit Number [id number]    Blood Component Type RED CELLS,LR    Unit division 00    Status of Unit ALLOCATED    Transfusion Status OK TO TRANSFUSE  Crossmatch Result Compatible    Unit Number [id number]    Blood Component Type RED CELLS,LR    Unit division 00    Status of Unit ISSUED    Transfusion Status OK TO TRANSFUSE    Crossmatch Result Compatible    Unit Number [id number]    Blood Component Type RED CELLS,LR    Unit division 00    Status of Unit ISSUED    Transfusion Status OK TO TRANSFUSE    Crossmatch Result Compatible    Unit Number [id number]    Blood Component Type RBC LR PHER1    Unit division 00    Status of Unit ISSUED    Transfusion Status OK TO TRANSFUSE    Crossmatch Result Compatible    Unit Number [id number]    Blood Component Type RBC LR PHER2    Unit division 00    Status of Unit ISSUED    Transfusion Status OK TO TRANSFUSE    Crossmatch Result Compatible   CDS serology     Status: None   Collection Time: 07/13/15 12:22 AM  Result Value Ref Range   CDS serology specimen      SPECIMEN WILL BE HELD FOR 14 DAYS IF TESTING IS REQUIRED  Comprehensive metabolic panel     Status: Abnormal   Collection Time: 07/13/15 12:22 AM  Result Value Ref Range   Sodium 139 135 - 145 mmol/L   Potassium 3.3 (L) 3.5 - 5.1 mmol/L   Chloride 110 101 - 111 mmol/L   CO2 21 (L) 22 - 32 mmol/L   Glucose, Bld 167 (H) 65 - 99 mg/dL   BUN <5 (L) 6 - 20 mg/dL   Creatinine, Ser 0.87 0.44 - 1.00 mg/dL   Calcium 8.4 (L) 8.9 - 10.3 mg/dL   Total Protein 5.6 (L) 6.5 - 8.1 g/dL   Albumin 2.9  (L) 3.5 - 5.0 g/dL   AST 136 (H) 15 - 41 U/L   ALT 119 (H) 14 - 54 U/L   Alkaline Phosphatase 45 38 - 126 U/L   Total Bilirubin 0.6 0.3 - 1.2 mg/dL   GFR calc non Af Amer >60 >60 mL/min   GFR calc Af Amer >60 >60 mL/min   Anion gap 8 5 - 15  CBC     Status: Abnormal   Collection Time: 07/13/15 12:22 AM  Result Value Ref Range   WBC 15.4 (H) 4.0 - 10.5 K/uL   RBC 3.82 (L) 3.87 - 5.11 MIL/uL   Hemoglobin 10.0 (L) 12.0 - 15.0 g/dL   HCT 31.2 (L) 36.0 - 46.0 %   MCV 81.7 78.0 - 100.0 fL   MCH 26.2 26.0 - 34.0 pg   MCHC 32.1 30.0 - 36.0 g/dL   RDW 13.3 11.5 - 15.5 %   Platelets 250 150 - 400 K/uL  Ethanol     Status: None   Collection Time: 07/13/15 12:22 AM  Result Value Ref Range   Alcohol, Ethyl (B) <5 <5 mg/dL  Protime-INR     Status: None   Collection Time: 07/13/15 12:22 AM  Result Value Ref Range   Prothrombin Time 15.2 11.6 - 15.2 seconds   INR 1.18 0.00 - 1.49  hCG, quantitative, pregnancy     Status: None   Collection Time: 07/13/15 12:22 AM  Result Value Ref Range   hCG, Beta Chain, Quant, S <1 <5 mIU/mL  ABO/Rh     Status: None   Collection Time: 07/13/15 12:22 AM  Result Value Ref Range  ABO/RH(D) AB POS   Prepare RBC     Status: None   Collection Time: 07/13/15  3:15 AM  Result Value Ref Range   Order Confirmation ORDER PROCESSED BY BLOOD BANK   CBC     Status: Abnormal   Collection Time: 07/13/15  3:20 AM  Result Value Ref Range   WBC 11.0 (H) 4.0 - 10.5 K/uL   RBC 3.80 (L) 3.87 - 5.11 MIL/uL   Hemoglobin 10.7 (L) 12.0 - 15.0 g/dL   HCT 31.5 (L) 36.0 - 46.0 %   MCV 82.9 78.0 - 100.0 fL   MCH 28.2 26.0 - 34.0 pg   MCHC 34.0 30.0 - 36.0 g/dL   RDW 15.4 11.5 - 15.5 %   Platelets 154 150 - 400 K/uL  Comprehensive metabolic panel     Status: Abnormal   Collection Time: 07/13/15  3:20 AM  Result Value Ref Range   Sodium 137 135 - 145 mmol/L   Potassium 4.7 3.5 - 5.1 mmol/L   Chloride 114 (H) 101 - 111 mmol/L   CO2 18 (L) 22 - 32 mmol/L   Glucose, Bld  169 (H) 65 - 99 mg/dL   BUN 12 6 - 20 mg/dL   Creatinine, Ser 0.81 0.44 - 1.00 mg/dL   Calcium 6.8 (L) 8.9 - 10.3 mg/dL   Total Protein 3.1 (L) 6.5 - 8.1 g/dL   Albumin 1.8 (L) 3.5 - 5.0 g/dL   AST 150 (H) 15 - 41 U/L   ALT 112 (H) 14 - 54 U/L   Alkaline Phosphatase 30 (L) 38 - 126 U/L   Total Bilirubin 0.9 0.3 - 1.2 mg/dL   GFR calc non Af Amer >60 >60 mL/min   GFR calc Af Amer >60 >60 mL/min   Anion gap 5 5 - 15  Protime-INR     Status: Abnormal   Collection Time: 07/13/15  3:20 AM  Result Value Ref Range   Prothrombin Time 17.9 (H) 11.6 - 15.2 seconds   INR 1.47 0.00 - 1.49  APTT     Status: Abnormal   Collection Time: 07/13/15  3:20 AM  Result Value Ref Range   aPTT 23 (L) 24 - 37 seconds  Lactic acid, plasma     Status: Abnormal   Collection Time: 07/13/15  3:20 AM  Result Value Ref Range   Lactic Acid, Venous 2.5 (HH) 0.5 - 2.0 mmol/L  Triglycerides     Status: None   Collection Time: 07/13/15  3:20 AM  Result Value Ref Range   Triglycerides 135 <150 mg/dL  Blood gas, arterial     Status: Abnormal   Collection Time: 07/13/15  4:00 AM  Result Value Ref Range   FIO2 0.40    Delivery systems VENTILATOR    Mode PRESSURE REGULATED VOLUME CONTROL    VT 450 mL   LHR 16 resp/min   Peep/cpap 5.0 cm H20   pH, Arterial 7.285 (L) 7.350 - 7.450   pCO2 arterial 36.0 35.0 - 45.0 mmHg   pO2, Arterial 203 (H) 80.0 - 100.0 mmHg   Bicarbonate 16.9 (L) 20.0 - 24.0 mEq/L   TCO2 18.1 0 - 100 mmol/L   Acid-base deficit 8.7 (H) 0.0 - 2.0 mmol/L   O2 Saturation 99.2 %   Patient temperature 96.2    Collection site ARTERIAL LINE    Drawn by [id number]    Sample type ARTERIAL DRAW   MRSA PCR Screening     Status: None   Collection Time: 07/13/15  5:09 AM  Result Value Ref Range   MRSA by PCR NEGATIVE NEGATIVE     Assessment/Plan:   NEURO  intubated and sedated   Plan: continue sedation  PULM  on vent   Plan: continue current settings  CARDIO  tachycardic   Plan: continue ivf   RENAL  uop stable   Plan: creatinine stable  GI  minimal distension   Plan: continue bowel rest  ID       HEME  h/h stable   Plan: continue to monitor  ENDO      Global Issues      LOS: 0 days   Additional comments:will continue to leave intubated and sedated today  Critical Care Total Time*: 15 Minutes  Ruth Dunlap 07/13/2015  *Care during the described time interval was provided by me and/or other providers on the critical care team.  I have reviewed this patient's available data, including medical history, events of note, physical examination and test results as part of my evaluation.

## 2015-07-13 NOTE — Progress Notes (Signed)
   07/13/15 0100  Clinical Encounter Type  Visited With Family  Visit Type Follow-up;Psychological support;Spiritual support;Patient in surgery  Spiritual Encounters  Spiritual Needs Emotional  Ch met family in ED and escorted to waiting area on 2nd Floor; [place]son escorted family to 3rd Floor waiting room pending transfer post-op to [facility].  Candis Schatz Youlanda Tomassetti 1:30 AM

## 2015-07-14 ENCOUNTER — Inpatient Hospital Stay (HOSPITAL_COMMUNITY): Payer: Medicaid Other

## 2015-07-14 LAB — BLOOD PRODUCT ORDER (VERBAL) VERIFICATION

## 2015-07-14 LAB — BASIC METABOLIC PANEL
Anion gap: 3 — ABNORMAL LOW (ref 5–15)
BUN: 16 mg/dL (ref 6–20)
CO2: 21 mmol/L — ABNORMAL LOW (ref 22–32)
CREATININE: 1.06 mg/dL — AB (ref 0.44–1.00)
Calcium: 6.8 mg/dL — ABNORMAL LOW (ref 8.9–10.3)
Chloride: 114 mmol/L — ABNORMAL HIGH (ref 101–111)
GFR calc Af Amer: >60 mL/min (ref >60)
Glucose, Bld: 127 mg/dL — ABNORMAL HIGH (ref 65–99)
Potassium: 4.5 mmol/L (ref 3.5–5.1)
SODIUM: 138 mmol/L (ref 135–145)

## 2015-07-14 LAB — CBC
HCT: 32.1 % — ABNORMAL LOW (ref 36.0–46.0)
Hemoglobin: 10.8 g/dL — ABNORMAL LOW (ref 12.0–15.0)
MCH: 28.8 pg (ref 26.0–34.0)
MCHC: 33.6 g/dL (ref 30.0–36.0)
MCV: 85.6 fL (ref 78.0–100.0)
PLATELETS: 111 10*3/uL — AB (ref 150–400)
RBC: 3.75 MIL/uL — AB (ref 3.87–5.11)
RDW: 16.4 % — ABNORMAL HIGH (ref 11.5–15.5)
WBC: 15.8 10*3/uL — ABNORMAL HIGH (ref 4.0–10.5)

## 2015-07-14 LAB — POCT I-STAT 3, ART BLOOD GAS (G3+)
ACID-BASE DEFICIT: 7 mmol/L — AB (ref 0.0–2.0)
Bicarbonate: 20.6 meq/L (ref 20.0–24.0)
O2 Saturation: 99 %
PH ART: 7.199 — AB (ref 7.350–7.450)
TCO2: 22 mmol/L (ref 0–100)
pCO2 arterial: 52.6 mmHg — ABNORMAL HIGH (ref 35.0–45.0)
pO2, Arterial: 153 mmHg — ABNORMAL HIGH (ref 80.0–100.0)

## 2015-07-14 MED ORDER — LACTATED RINGERS IV BOLUS (SEPSIS)
1000.0000 mL | Freq: Once | INTRAVENOUS | Status: AC
  Administered 2015-07-14: 1000 mL via INTRAVENOUS

## 2015-07-14 NOTE — Progress Notes (Signed)
Follow up - Trauma and Critical Care  Patient Details:    Ruth Dunlap is an 19 y.o. female.  Lines/tubes : Airway 7.5 mm (Active)  Secured at (cm) 22 cm 07/14/2015  8:55 AM  Measured From Lips 07/14/2015  8:55 AM  Secured Location Right 07/14/2015  8:55 AM  Secured By Pink Tape 07/14/2015  8:55 AM  Cuff Pressure (cm H2O) 24 cm H2O 07/14/2015  4:15 AM  Site Condition Dry;Cool 07/14/2015  8:55 AM     Arterial Line 07/13/15 Left Radial (Active)  Site Assessment Clean;Dry;Intact 07/14/2015  8:00 AM  Line Status Pulsatile blood flow 07/14/2015  8:00 AM  Art Line Waveform Appropriate;Square wave test performed 07/14/2015  8:00 AM  Art Line Interventions Zeroed and calibrated;Leveled;Line pulled back 07/13/2015  8:00 AM  Color/Movement/Sensation Capillary refill less than 3 sec 07/14/2015  8:00 AM  Dressing Type Transparent 07/14/2015  8:00 AM  Dressing Status Clean;Dry;Intact 07/14/2015  8:00 AM  Dressing Change Due 07/20/15 07/13/2015  8:00 AM     Closed System Drain 1 Left Abdomen Bulb (JP) 19 Fr. (Active)  Site Description Unable to view 07/14/2015  8:00 AM  Dressing Status Clean;Dry;Intact 07/14/2015  8:00 AM  Drainage Appearance Bloody;Bright red 07/14/2015  8:00 AM  Status To suction (Charged) 07/14/2015  8:00 AM  Output (mL) 20 mL 07/14/2015  6:00 AM     Closed System Drain 2 Left Abdomen Bulb (JP) 19 Fr. (Active)  Site Description Unable to view 07/14/2015  8:00 AM  Dressing Status Clean;Dry;Intact 07/14/2015  8:00 AM  Drainage Appearance Bloody;Bright red 07/14/2015  8:00 AM  Status To suction (Charged) 07/14/2015  8:00 AM  Output (mL) 30 mL 07/14/2015  6:00 AM     NG/OG Tube Nasogastric Right nare (Active)  Placement Verification Auscultation 07/14/2015  8:00 AM  Site Assessment Clean;Dry;Intact 07/14/2015  8:00 AM  Status Suction-low intermittent 07/14/2015  8:00 AM  Drainage Appearance Coffee ground 07/13/2015  8:00 AM  Output (mL) 50 mL 07/14/2015  6:00 AM      Urethral Catheter Rexanne Mano RN Latex 16 Fr. (Active)  Indication for Insertion or Continuance of Catheter Peri-operative use for selective surgical procedure 07/14/2015  8:00 AM  Site Assessment Clean;Intact 07/14/2015  8:00 AM  Catheter Maintenance Bag below level of bladder;Catheter secured;Drainage bag/tubing not touching floor;Insertion date on drainage bag;No dependent loops;Seal intact 07/14/2015  8:00 AM  Collection Container Standard drainage bag 07/14/2015  8:00 AM  Securement Method Securing device (Describe) 07/14/2015  8:00 AM  Urinary Catheter Interventions Unclamped 07/14/2015  8:00 AM  Output (mL) 105 mL 07/14/2015  8:00 AM    Microbiology/Sepsis markers: Results for orders placed or performed during the hospital encounter of 07/13/15  MRSA PCR Screening     Status: None   Collection Time: 07/13/15  5:09 AM  Result Value Ref Range Status   MRSA by PCR NEGATIVE NEGATIVE Final    Comment:        The GeneXpert MRSA Assay (FDA approved for NASAL specimens only), is one component of a comprehensive MRSA colonization surveillance program. It is not intended to diagnose MRSA infection nor to guide or monitor treatment for MRSA infections.     Anti-infectives:  Anti-infectives    Start     Dose/Rate Route Frequency Ordered Stop   07/13/15 0600  cefOXitin (MEFOXIN) 2 g in dextrose 5 % 50 mL IVPB     2 g 100 mL/hr over 30 Minutes Intravenous 4 times per day 07/13/15 0300 07/13/15  1757      Best Practice/Protocols:  VTE proph: high risk for bleeding Intermittent Sedation  Consults: Treatment Team:  Trauma Md, MD    Events:  Subjective:    Overnight Issues: Low BP overnight, started on neo, low UOP this am. Weaning vent for first time  Objective:  Vital signs for last 24 hours: Temp:  [97.5 F (36.4 C)-99.1 F (37.3 C)] 99.1 F (37.3 C) (12/18 0731) Pulse Rate:  [103-131] 121 (12/18 0900) Resp:  [10-18] 10 (12/18 0900) BP: (79-129)/(39-81)  99/42 mmHg (12/18 0900) SpO2:  [98 %-100 %] 100 % (12/18 0900) Arterial Line BP: (74-139)/(41-60) 126/55 mmHg (12/18 0900) FiO2 (%):  [40 %] 40 % (12/18 0855)  Hemodynamic parameters for last 24 hours:    Intake/Output from previous day: 12/17 0701 - 12/18 0700 In: 4377.1 [I.V.:4327.1; IV Piggyback:50] Out: [id number] [Urine:850; Emesis/NG output:100; Drains:720]  Intake/Output this shift: Total I/O In: 356 [I.V.:356] Out: 105 [Urine:105]  Vent settings for last 24 hours: Vent Mode:  [-] PSV;CPAP FiO2 (%):  [40 %] 40 % Set Rate:  [16 bmp] 16 bmp Vt Set:  [450 mL] 450 mL PEEP:  [5 cmH20] 5 cmH20 Pressure Support:  [5 cmH20] 5 cmH20 Plateau Pressure:  [16 cmH20-19 cmH20] 19 cmH20  Physical Exam:  Gen: intubated, alert HEENT: ETT in position Resp: clear b/l Cardiovascular: tachycardic Abdomen: soft, ATTP, wound c/d/i, jp with thin serosang liquid Ext: trace edema Neuro: GCS 10T  No results found for this or any previous visit (from the past 24 hour(s)).   Assessment/Plan:   NEURO  No abnormality   Plan: Pain control and sedation as needed  PULM  Intubated for severity of injury   Plan: wean with possible extubate  CARDIO  Low BP   Plan: wean neo as tolerated and give more fluid  RENAL  Low UOP, left kidney injury   Plan: urine light red no clots, continue catheter, give more fluid  GI  Stomach and liver injury   Plan: NPO, continue NG, continue panc drain  ID  No issues, WBC improving   Plan: continue to watch  HEME  Hemorrhagic shock now improved   Plan: f/u labs  ENDO No issues   Plan: FEN/SSI while in ICU  Global Issues      LOS: 1 day   Additional comments:  Critical Care Total Time*: 15 Minutes  Arta Bruce Kyerra Vargo 07/14/2015  *Care during the described time interval was provided by me and/or other providers on the critical care team.  I have reviewed this patient's available data, including medical history, events of note, physical examination  and test results as part of my evaluation.

## 2015-07-15 ENCOUNTER — Inpatient Hospital Stay (HOSPITAL_COMMUNITY): Payer: Medicaid Other

## 2015-07-15 ENCOUNTER — Encounter (HOSPITAL_COMMUNITY): Payer: Self-pay | Admitting: General Surgery

## 2015-07-15 LAB — BASIC METABOLIC PANEL
Anion gap: 5 (ref 5–15)
BUN: 14 mg/dL (ref 6–20)
CALCIUM: 7.3 mg/dL — AB (ref 8.9–10.3)
CO2: 22 mmol/L (ref 22–32)
CREATININE: 0.95 mg/dL (ref 0.44–1.00)
Chloride: 111 mmol/L (ref 101–111)
GFR calc non Af Amer: >60 mL/min (ref >60)
Glucose, Bld: 82 mg/dL (ref 65–99)
Potassium: 4.2 mmol/L (ref 3.5–5.1)
SODIUM: 138 mmol/L (ref 135–145)

## 2015-07-15 LAB — CBC
HCT: 28.7 % — ABNORMAL LOW (ref 36.0–46.0)
HEMATOCRIT: 25.2 % — AB (ref 36.0–46.0)
Hemoglobin: 9.5 g/dL — ABNORMAL LOW (ref 12.0–15.0)
Hemoglobin: 8.6 g/dL — ABNORMAL LOW (ref 12.0–15.0)
MCH: 28.4 pg (ref 26.0–34.0)
MCH: 29.4 pg (ref 26.0–34.0)
MCHC: 33.1 g/dL (ref 30.0–36.0)
MCHC: 34.1 g/dL (ref 30.0–36.0)
MCV: 85.9 fL (ref 78.0–100.0)
MCV: 86 fL (ref 78.0–100.0)
PLATELETS: 141 10*3/uL — AB (ref 150–400)
Platelets: 129 10*3/uL — ABNORMAL LOW (ref 150–400)
RBC: 3.34 MIL/uL — AB (ref 3.87–5.11)
RBC: 2.93 MIL/uL — ABNORMAL LOW (ref 3.87–5.11)
RDW: 16.8 % — AB (ref 11.5–15.5)
RDW: 16.9 % — AB (ref 11.5–15.5)
WBC: 14.6 10*3/uL — AB (ref 4.0–10.5)
WBC: 14.4 10*3/uL — AB (ref 4.0–10.5)

## 2015-07-15 LAB — POCT I-STAT 7, (LYTES, BLD GAS, ICA,H+H)
ACID-BASE DEFICIT: 7 mmol/L — AB (ref 0.0–2.0)
BICARBONATE: 19.4 meq/L — AB (ref 20.0–24.0)
CALCIUM ION: 1 mmol/L — AB (ref 1.12–1.23)
HEMATOCRIT: 28 % — AB (ref 36.0–46.0)
Hemoglobin: 9.5 g/dL — ABNORMAL LOW (ref 12.0–15.0)
O2 SAT: 100 %
PH ART: 7.282 — AB (ref 7.350–7.450)
POTASSIUM: 4.4 mmol/L (ref 3.5–5.1)
Patient temperature: 35
SODIUM: 139 mmol/L (ref 135–145)
TCO2: 21 mmol/L (ref 0–100)
pCO2 arterial: 40.2 mmHg (ref 35.0–45.0)
pO2, Arterial: 625 mmHg — ABNORMAL HIGH (ref 80.0–100.0)

## 2015-07-15 MED ORDER — ALBUMIN HUMAN 5 % IV SOLN
12.5000 g | Freq: Once | INTRAVENOUS | Status: AC
  Administered 2015-07-15: 12.5 g via INTRAVENOUS
  Filled 2015-07-15: qty 250

## 2015-07-15 MED ORDER — FENTANYL CITRATE (PF) 100 MCG/2ML IJ SOLN
25.0000 ug | INTRAMUSCULAR | Status: DC | PRN
  Administered 2015-07-15 – 2015-07-20 (×9): 50 ug via INTRAVENOUS
  Filled 2015-07-15 (×9): qty 2

## 2015-07-15 MED ORDER — CALCIUM GLUCONATE 10 % IV SOLN
1.0000 g | Freq: Once | INTRAVENOUS | Status: AC
  Administered 2015-07-15: 1 g via INTRAVENOUS
  Filled 2015-07-15: qty 10

## 2015-07-15 NOTE — Progress Notes (Signed)
   07/15/15 1604  Clinical Encounter Type  Visited With Patient;Health care provider  Visit Type Initial   Chaplain met with the patient, and introduced spiritual care services. Chaplain support available as needed.   Jeri Lager, Chaplain 07/15/2015 4:05 PM

## 2015-07-15 NOTE — Progress Notes (Signed)
Patient ID: Ruth Dunlap, female   DOB: February 21, 1996, 19 y.o.   MRN: [id number] Doing well since extubation. HS still up - give additional albumin. NPO. Georganna Skeans, MD, MPH, FACS Trauma: [phone] General Surgery: [phone]

## 2015-07-15 NOTE — Procedures (Signed)
Extubation Procedure Note  Patient Details:   Name: Ruth Dunlap DOB: [birth year] MRN: [id number]   Airway Documentation:  Airway 7.5 mm (Active)  Secured at (cm) 22 cm 07/15/2015  7:39 AM  Measured From Lips 07/15/2015  7:39 AM  [place] 07/15/2015  7:39 AM  Secured By [employer] 07/15/2015  7:39 AM  Tube Holder Repositioned Yes 07/15/2015  7:39 AM  Cuff Pressure (cm H2O) 24 cm H2O 07/14/2015  8:11 PM  Site Condition Dry 07/15/2015  7:39 AM    Evaluation  O2 sats: stable throughout Complications: No apparent complications Patient did tolerate procedure well. Bilateral Breath Sounds: Clear, Diminished Suctioning: Airway Yes   Pt extubated to 4L [place], no stridor noted, followed commands well.  RN at bedside.  Donnetta Hail 07/15/2015, 10:33 AM

## 2015-07-15 NOTE — Progress Notes (Signed)
Wasted 180 ml of Fentanyl (20mcg/ml) in sink.  Margo Aye, RN witnessed. [place], [place] C 07/15/2015 9:11 AM
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
## 2015-07-15 NOTE — Progress Notes (Signed)
[facility] notified of patient's hypotension and tachycardia. No new orders at this time. Will continue to monitor.

## 2015-07-16 ENCOUNTER — Encounter (HOSPITAL_COMMUNITY): Payer: Self-pay | Admitting: General Practice

## 2015-07-16 LAB — BASIC METABOLIC PANEL
Anion gap: 9 (ref 5–15)
BUN: 11 mg/dL (ref 6–20)
CHLORIDE: 112 mmol/L — AB (ref 101–111)
CO2: 19 mmol/L — ABNORMAL LOW (ref 22–32)
CREATININE: 0.86 mg/dL (ref 0.44–1.00)
Calcium: 8.2 mg/dL — ABNORMAL LOW (ref 8.9–10.3)
GFR calc non Af Amer: >60 mL/min (ref >60)
Glucose, Bld: 57 mg/dL — ABNORMAL LOW (ref 65–99)
Potassium: 3.6 mmol/L (ref 3.5–5.1)
SODIUM: 140 mmol/L (ref 135–145)

## 2015-07-16 LAB — CBC
HCT: 25.9 % — ABNORMAL LOW (ref 36.0–46.0)
HEMOGLOBIN: 8.9 g/dL — AB (ref 12.0–15.0)
MCH: 29.4 pg (ref 26.0–34.0)
MCHC: 34.4 g/dL (ref 30.0–36.0)
MCV: 85.5 fL (ref 78.0–100.0)
Platelets: 163 10*3/uL (ref 150–400)
RBC: 3.03 MIL/uL — AB (ref 3.87–5.11)
RDW: 16.8 % — ABNORMAL HIGH (ref 11.5–15.5)
WBC: 18.6 10*3/uL — AB (ref 4.0–10.5)

## 2015-07-16 LAB — TRIGLYCERIDES: TRIGLYCERIDES: 136 mg/dL (ref <150)

## 2015-07-16 LAB — LACTIC ACID, PLASMA: Lactic Acid, Venous: 0.7 mmol/L (ref 0.5–2.0)

## 2015-07-16 MED ORDER — DIPHENHYDRAMINE HCL 12.5 MG/5ML PO ELIX: 12.5000 mg | ORAL_SOLUTION | Freq: Four times a day (QID) | ORAL | Status: DC | PRN

## 2015-07-16 MED ORDER — DIPHENHYDRAMINE HCL 50 MG/ML IJ SOLN: 12.5000 mg | Freq: Four times a day (QID) | INTRAMUSCULAR | Status: DC | PRN

## 2015-07-16 MED ORDER — KCL IN DEXTROSE-NACL 20-5-0.45 MEQ/L-%-% IV SOLN
INTRAVENOUS | Status: DC
Start: 2015-07-16 — End: 2015-07-23
  Administered 2015-07-16 – 2015-07-17 (×2): via INTRAVENOUS
  Administered 2015-07-17: 100 mL/h via INTRAVENOUS
  Administered 2015-07-19: 04:00:00 via INTRAVENOUS
  Administered 2015-07-20: 75 mL/h via INTRAVENOUS
  Administered 2015-07-20 – 2015-07-23 (×4): via INTRAVENOUS
  Filled 2015-07-16 (×14): qty 1000

## 2015-07-16 MED ORDER — ONDANSETRON HCL 4 MG/2ML IJ SOLN: 4.0000 mg | Freq: Four times a day (QID) | INTRAMUSCULAR | Status: DC | PRN

## 2015-07-16 MED ORDER — HYDROMORPHONE 1 MG/ML IV SOLN
INTRAVENOUS | Status: DC
  Administered 2015-07-16: 10:00:00 via INTRAVENOUS
  Filled 2015-07-16: qty 25

## 2015-07-16 MED ORDER — SODIUM CHLORIDE 0.9 % IJ SOLN: 9.0000 mL | INTRAMUSCULAR | Status: DC | PRN

## 2015-07-16 MED ORDER — NALOXONE HCL 0.4 MG/ML IJ SOLN: 0.4000 mg | INTRAMUSCULAR | Status: DC | PRN
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
## 2015-07-17 LAB — TYPE AND SCREEN
ABO/RH(D): AB POS
ANTIBODY SCREEN: NEGATIVE
UNIT DIVISION: 0
UNIT DIVISION: 0
UNIT DIVISION: 0
UNIT DIVISION: 0
Unit division: 0
Unit division: 0
Unit division: 0
Unit division: 0

## 2015-07-17 LAB — COMPREHENSIVE METABOLIC PANEL
ALT: 98 U/L — ABNORMAL HIGH (ref 14–54)
AST: 70 U/L — ABNORMAL HIGH (ref 15–41)
Albumin: 1.8 g/dL — ABNORMAL LOW (ref 3.5–5.0)
Alkaline Phosphatase: 61 U/L (ref 38–126)
Anion gap: 8 (ref 5–15)
BILIRUBIN TOTAL: 2 mg/dL — AB (ref 0.3–1.2)
BUN: 9 mg/dL (ref 6–20)
CHLORIDE: 108 mmol/L (ref 101–111)
CO2: 21 mmol/L — ABNORMAL LOW (ref 22–32)
Calcium: 8.1 mg/dL — ABNORMAL LOW (ref 8.9–10.3)
Creatinine, Ser: 0.75 mg/dL (ref 0.44–1.00)
Glucose, Bld: 118 mg/dL — ABNORMAL HIGH (ref 65–99)
POTASSIUM: 3.4 mmol/L — AB (ref 3.5–5.1)
Sodium: 137 mmol/L (ref 135–145)
TOTAL PROTEIN: 4.8 g/dL — AB (ref 6.5–8.1)

## 2015-07-17 LAB — CBC
HCT: 27.1 % — ABNORMAL LOW (ref 36.0–46.0)
Hemoglobin: 9.4 g/dL — ABNORMAL LOW (ref 12.0–15.0)
MCH: 29.4 pg (ref 26.0–34.0)
MCHC: 34.7 g/dL (ref 30.0–36.0)
MCV: 84.7 fL (ref 78.0–100.0)
PLATELETS: 191 10*3/uL (ref 150–400)
RBC: 3.2 MIL/uL — ABNORMAL LOW (ref 3.87–5.11)
RDW: 16.4 % — AB (ref 11.5–15.5)
WBC: 19 10*3/uL — ABNORMAL HIGH (ref 4.0–10.5)

## 2015-07-17 LAB — MISC LABCORP TEST (SEND OUT): LABCORP TEST CODE: 88062

## 2015-07-17 MED ORDER — NALOXONE HCL 0.4 MG/ML IJ SOLN: 0.4000 mg | INTRAMUSCULAR | Status: DC | PRN

## 2015-07-17 MED ORDER — PHENOL 1.4 % MT LIQD
1.0000 | OROMUCOSAL | Status: DC | PRN
  Administered 2015-07-17: 1 via OROMUCOSAL
  Filled 2015-07-17: qty 177

## 2015-07-17 MED ORDER — DIPHENHYDRAMINE HCL 50 MG/ML IJ SOLN
12.5000 mg | Freq: Four times a day (QID) | INTRAMUSCULAR | Status: DC | PRN
  Administered 2015-07-18: 25 mg via INTRAVENOUS
  Administered 2015-07-18 – 2015-07-20 (×4): 12.5 mg via INTRAVENOUS
  Filled 2015-07-17 (×5): qty 1

## 2015-07-17 MED ORDER — ONDANSETRON HCL 4 MG/2ML IJ SOLN: 4.0000 mg | Freq: Four times a day (QID) | INTRAMUSCULAR | Status: DC | PRN

## 2015-07-17 MED ORDER — FENTANYL 40 MCG/ML IV SOLN
INTRAVENOUS | Status: DC
  Administered 2015-07-17: 60 ug via INTRAVENOUS
  Administered 2015-07-17: 12:00:00 via INTRAVENOUS
  Administered 2015-07-17: 60 ug via INTRAVENOUS
  Administered 2015-07-18: 50 ug via INTRAVENOUS
  Administered 2015-07-18: 17:00:00 via INTRAVENOUS
  Administered 2015-07-18: 330 ug via INTRAVENOUS
  Administered 2015-07-18: 195 ug via INTRAVENOUS
  Administered 2015-07-18: 100 ug via INTRAVENOUS
  Administered 2015-07-19: 90 ug via INTRAVENOUS
  Administered 2015-07-19: 135 ug via INTRAVENOUS
  Administered 2015-07-19: 90 ug via INTRAVENOUS
  Administered 2015-07-19: 120 ug via INTRAVENOUS
  Administered 2015-07-19: 210 ug via INTRAVENOUS
  Administered 2015-07-19: 90 ug via INTRAVENOUS
  Administered 2015-07-20: 40 ug via INTRAVENOUS
  Administered 2015-07-20: 01:00:00 via INTRAVENOUS
  Administered 2015-07-20: 175 ug via INTRAVENOUS
  Filled 2015-07-17 (×3): qty 25

## 2015-07-17 MED ORDER — DIPHENHYDRAMINE HCL 12.5 MG/5ML PO ELIX: 12.5000 mg | ORAL_SOLUTION | Freq: Four times a day (QID) | ORAL | Status: DC | PRN

## 2015-07-17 MED ORDER — SODIUM CHLORIDE 0.9 % IJ SOLN: 9.0000 mL | INTRAMUSCULAR | Status: DC | PRN
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
## 2015-07-17 NOTE — Progress Notes (Signed)
Wasted 45ml of dilaudid PCA syringe with [place] Jansky, RN
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
## 2015-07-18 LAB — CBC WITH DIFFERENTIAL/PLATELET
BASOS ABS: 0 10*3/uL (ref 0.0–0.1)
BASOS PCT: 0 %
EOS ABS: 0 10*3/uL (ref 0.0–0.7)
Eosinophils Relative: 0 %
HCT: 26.7 % — ABNORMAL LOW (ref 36.0–46.0)
Hemoglobin: 9.3 g/dL — ABNORMAL LOW (ref 12.0–15.0)
LYMPHS PCT: 8 %
Lymphs Abs: 1.6 10*3/uL (ref 0.7–4.0)
MCH: 29.4 pg (ref 26.0–34.0)
MCHC: 34.8 g/dL (ref 30.0–36.0)
MCV: 84.5 fL (ref 78.0–100.0)
Monocytes Absolute: 1.8 10*3/uL — ABNORMAL HIGH (ref 0.1–1.0)
Monocytes Relative: 9 %
NEUTROS PCT: 83 %
Neutro Abs: 16.9 10*3/uL — ABNORMAL HIGH (ref 1.7–7.7)
PLATELETS: 264 10*3/uL (ref 150–400)
RBC: 3.16 MIL/uL — ABNORMAL LOW (ref 3.87–5.11)
RDW: 16.4 % — ABNORMAL HIGH (ref 11.5–15.5)
WBC: 20.3 10*3/uL — ABNORMAL HIGH (ref 4.0–10.5)

## 2015-07-18 LAB — BASIC METABOLIC PANEL
Anion gap: 6 (ref 5–15)
BUN: 7 mg/dL (ref 6–20)
CHLORIDE: 104 mmol/L (ref 101–111)
CO2: 21 mmol/L — ABNORMAL LOW (ref 22–32)
Calcium: 7.3 mg/dL — ABNORMAL LOW (ref 8.9–10.3)
Creatinine, Ser: 0.75 mg/dL (ref 0.44–1.00)
Glucose, Bld: 306 mg/dL — ABNORMAL HIGH (ref 65–99)
POTASSIUM: 4.5 mmol/L (ref 3.5–5.1)
SODIUM: 131 mmol/L — AB (ref 135–145)

## 2015-07-18 LAB — LIPASE, FLUID: LIPASE-FLUID: 3036 U/L

## 2015-07-18 LAB — POCT I-STAT 3, ART BLOOD GAS (G3+)
Acid-base deficit: 7 mmol/L — ABNORMAL HIGH (ref 0.0–2.0)
Bicarbonate: 21 meq/L (ref 20.0–24.0)
O2 Saturation: 99 %
PCO2 ART: 54.7 mmHg — AB (ref 35.0–45.0)
PH ART: 7.193 — AB (ref 7.350–7.450)
PO2 ART: 153 mmHg — AB (ref 80.0–100.0)
Patient temperature: 99.1
TCO2: 23 mmol/L (ref 0–100)

## 2015-07-18 MED ORDER — SALINE SPRAY 0.65 % NA SOLN
1.0000 | NASAL | Status: DC | PRN
  Filled 2015-07-18: qty 44

## 2015-07-18 NOTE — Progress Notes (Signed)
Noted that lab glucose at 0309 today was 306 mg/dl. May want to consider checking CBGs at the bedside to see if  trend of blood sugars continues to be elevated. Will continue to follow while in hospital. Harvel Ricks RN BSN CDE
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
## 2015-07-19 ENCOUNTER — Inpatient Hospital Stay (HOSPITAL_COMMUNITY): Payer: Medicaid Other

## 2015-07-19 DIAGNOSIS — J9600 Acute respiratory failure, unspecified whether with hypoxia or hypercapnia: Secondary | ICD-10-CM | POA: Diagnosis present

## 2015-07-19 DIAGNOSIS — S3630XA Unspecified injury of stomach, initial encounter: Secondary | ICD-10-CM | POA: Diagnosis present

## 2015-07-19 DIAGNOSIS — S36119A Unspecified injury of liver, initial encounter: Secondary | ICD-10-CM | POA: Diagnosis present

## 2015-07-19 DIAGNOSIS — S37009A Unspecified injury of unspecified kidney, initial encounter: Secondary | ICD-10-CM | POA: Diagnosis present

## 2015-07-19 DIAGNOSIS — D62 Acute posthemorrhagic anemia: Secondary | ICD-10-CM | POA: Diagnosis not present

## 2015-07-19 LAB — CBC WITH DIFFERENTIAL/PLATELET
BASOS ABS: 0 10*3/uL (ref 0.0–0.1)
Basophils Relative: 0 %
EOS ABS: 0.3 10*3/uL (ref 0.0–0.7)
Eosinophils Relative: 1 %
HCT: 26.6 % — ABNORMAL LOW (ref 36.0–46.0)
HEMOGLOBIN: 9 g/dL — AB (ref 12.0–15.0)
LYMPHS ABS: 3 10*3/uL (ref 0.7–4.0)
Lymphocytes Relative: 12 %
MCH: 28.9 pg (ref 26.0–34.0)
MCHC: 33.8 g/dL (ref 30.0–36.0)
MCV: 85.5 fL (ref 78.0–100.0)
Monocytes Absolute: 1.8 10*3/uL — ABNORMAL HIGH (ref 0.1–1.0)
Monocytes Relative: 7 %
NEUTROS ABS: 20.1 10*3/uL — AB (ref 1.7–7.7)
Neutrophils Relative %: 80 %
Platelets: 201 10*3/uL (ref 150–400)
RBC: 3.11 MIL/uL — ABNORMAL LOW (ref 3.87–5.11)
RDW: 16.6 % — ABNORMAL HIGH (ref 11.5–15.5)
WBC: 25.2 10*3/uL — ABNORMAL HIGH (ref 4.0–10.5)

## 2015-07-19 LAB — COMPREHENSIVE METABOLIC PANEL
ALT: 56 U/L — AB (ref 14–54)
ANION GAP: 10 (ref 5–15)
AST: 38 U/L (ref 15–41)
Albumin: 1.8 g/dL — ABNORMAL LOW (ref 3.5–5.0)
Alkaline Phosphatase: 57 U/L (ref 38–126)
BUN: 7 mg/dL (ref 6–20)
CHLORIDE: 102 mmol/L (ref 101–111)
CO2: 22 mmol/L (ref 22–32)
CREATININE: 0.75 mg/dL (ref 0.44–1.00)
Calcium: 7.9 mg/dL — ABNORMAL LOW (ref 8.9–10.3)
GFR calc non Af Amer: >60 mL/min (ref >60)
Glucose, Bld: 109 mg/dL — ABNORMAL HIGH (ref 65–99)
POTASSIUM: 3.6 mmol/L (ref 3.5–5.1)
SODIUM: 134 mmol/L — AB (ref 135–145)
Total Bilirubin: 1.3 mg/dL — ABNORMAL HIGH (ref 0.3–1.2)
Total Protein: 5.5 g/dL — ABNORMAL LOW (ref 6.5–8.1)

## 2015-07-19 MED ORDER — IOHEXOL 300 MG/ML  SOLN
100.0000 mL | Freq: Once | INTRAMUSCULAR | Status: AC | PRN
  Administered 2015-07-19: 100 mL via INTRAVENOUS

## 2015-07-19 MED ORDER — METOPROLOL TARTRATE 1 MG/ML IV SOLN
10.0000 mg | Freq: Four times a day (QID) | INTRAVENOUS | Status: DC | PRN
  Administered 2015-07-19 – 2015-07-21 (×4): 10 mg via INTRAVENOUS
  Filled 2015-07-19 (×4): qty 10

## 2015-07-19 MED ORDER — BARIUM SULFATE 2.1 % PO SUSP
ORAL | Status: AC
  Administered 2015-07-19: 1 mL
  Filled 2015-07-19: qty 2

## 2015-07-19 MED ORDER — ENOXAPARIN SODIUM 30 MG/0.3ML ~~LOC~~ SOLN
30.0000 mg | Freq: Two times a day (BID) | SUBCUTANEOUS | Status: DC
  Administered 2015-07-19 – 2015-07-24 (×12): 30 mg via SUBCUTANEOUS
  Filled 2015-07-19 (×12): qty 0.3

## 2015-07-19 MED ORDER — BARIUM SULFATE 2.1 % PO SUSP
900.0000 mL | ORAL | Status: AC
  Administered 2015-07-19: 900 mL via ORAL
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
## 2015-07-19 NOTE — Care Management (Signed)
UR updated . Letia Guidry RN BSN [phone]  
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
## 2015-07-20 ENCOUNTER — Inpatient Hospital Stay (HOSPITAL_COMMUNITY): Payer: Medicaid Other

## 2015-07-20 LAB — CBC
HCT: 27.3 % — ABNORMAL LOW (ref 36.0–46.0)
Hemoglobin: 8.8 g/dL — ABNORMAL LOW (ref 12.0–15.0)
MCH: 27.8 pg (ref 26.0–34.0)
MCHC: 32.2 g/dL (ref 30.0–36.0)
MCV: 86.4 fL (ref 78.0–100.0)
PLATELETS: 287 10*3/uL (ref 150–400)
RBC: 3.16 MIL/uL — AB (ref 3.87–5.11)
RDW: 16.3 % — AB (ref 11.5–15.5)
WBC: 27.8 10*3/uL — ABNORMAL HIGH (ref 4.0–10.5)

## 2015-07-20 LAB — URINE MICROSCOPIC-ADD ON

## 2015-07-20 LAB — URINALYSIS, ROUTINE W REFLEX MICROSCOPIC
BILIRUBIN URINE: NEGATIVE
Glucose, UA: NEGATIVE mg/dL
KETONES UR: NEGATIVE mg/dL
NITRITE: NEGATIVE
PROTEIN: 30 mg/dL — AB
Specific Gravity, Urine: 1.01 (ref 1.005–1.030)
pH: 7 (ref 5.0–8.0)

## 2015-07-20 MED ORDER — LEVOFLOXACIN 750 MG PO TABS
750.0000 mg | ORAL_TABLET | ORAL | Status: DC
  Administered 2015-07-20 – 2015-07-24 (×5): 750 mg via ORAL
  Filled 2015-07-20 (×5): qty 1

## 2015-07-20 MED ORDER — OXYCODONE-ACETAMINOPHEN 5-325 MG PO TABS
1.0000 | ORAL_TABLET | ORAL | Status: DC | PRN
  Administered 2015-07-20 – 2015-07-21 (×5): 2 via ORAL
  Filled 2015-07-20 (×5): qty 2

## 2015-07-20 MED ORDER — IPRATROPIUM-ALBUTEROL 0.5-2.5 (3) MG/3ML IN SOLN
3.0000 mL | Freq: Four times a day (QID) | RESPIRATORY_TRACT | Status: DC
  Administered 2015-07-20 (×2): 3 mL via RESPIRATORY_TRACT
  Filled 2015-07-20 (×2): qty 3

## 2015-07-20 MED ORDER — METHOCARBAMOL 750 MG PO TABS
750.0000 mg | ORAL_TABLET | Freq: Four times a day (QID) | ORAL | Status: DC | PRN
  Administered 2015-07-21: 750 mg via ORAL
  Filled 2015-07-20 (×3): qty 1

## 2015-07-20 MED ORDER — IPRATROPIUM-ALBUTEROL 0.5-2.5 (3) MG/3ML IN SOLN
3.0000 mL | Freq: Two times a day (BID) | RESPIRATORY_TRACT | Status: DC
  Administered 2015-07-21 – 2015-07-22 (×3): 3 mL via RESPIRATORY_TRACT
  Filled 2015-07-20 (×3): qty 3

## 2015-07-20 MED ORDER — SODIUM CHLORIDE 0.9 % IJ SOLN: 3.0000 mL | INTRAMUSCULAR | Status: DC | PRN

## 2015-07-20 NOTE — Progress Notes (Signed)
Patient was able to ambulate to restroom, standby assist.Vsitor at bedside. Only distress noted was sometimes having nightmares when she was sleep. Vital signs stable, call light within reach.
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
## 2015-07-21 ENCOUNTER — Encounter (HOSPITAL_COMMUNITY): Payer: Self-pay

## 2015-07-21 MED ORDER — HYDROMORPHONE HCL 1 MG/ML IJ SOLN
1.0000 mg | INTRAMUSCULAR | Status: DC | PRN
  Administered 2015-07-21 – 2015-07-22 (×2): 1 mg via INTRAVENOUS
  Filled 2015-07-21 (×3): qty 1

## 2015-07-21 MED ORDER — ONDANSETRON HCL 4 MG/2ML IJ SOLN
4.0000 mg | INTRAMUSCULAR | Status: DC | PRN
  Administered 2015-07-21 – 2015-07-23 (×7): 4 mg via INTRAVENOUS
  Filled 2015-07-21 (×6): qty 2
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
## 2015-07-22 LAB — CBC WITH DIFFERENTIAL/PLATELET
BASOS ABS: 0 10*3/uL (ref 0.0–0.1)
Basophils Relative: 0 %
EOS ABS: 0.2 10*3/uL (ref 0.0–0.7)
Eosinophils Relative: 1 %
HEMATOCRIT: 26 % — AB (ref 36.0–46.0)
Hemoglobin: 8.3 g/dL — ABNORMAL LOW (ref 12.0–15.0)
LYMPHS ABS: 2.2 10*3/uL (ref 0.7–4.0)
Lymphocytes Relative: 9 %
MCH: 27.6 pg (ref 26.0–34.0)
MCHC: 31.9 g/dL (ref 30.0–36.0)
MCV: 86.4 fL (ref 78.0–100.0)
MONO ABS: 1.7 10*3/uL — AB (ref 0.1–1.0)
Monocytes Relative: 7 %
NEUTROS PCT: 83 %
Neutro Abs: 20.4 10*3/uL — ABNORMAL HIGH (ref 1.7–7.7)
PLATELETS: 477 10*3/uL — AB (ref 150–400)
RBC: 3.01 MIL/uL — AB (ref 3.87–5.11)
RDW: 16.7 % — AB (ref 11.5–15.5)
WBC: 24.5 10*3/uL — AB (ref 4.0–10.5)

## 2015-07-22 LAB — COMPREHENSIVE METABOLIC PANEL
ALT: 32 U/L (ref 14–54)
AST: 28 U/L (ref 15–41)
Albumin: 2 g/dL — ABNORMAL LOW (ref 3.5–5.0)
Alkaline Phosphatase: 63 U/L (ref 38–126)
Anion gap: 9 (ref 5–15)
BUN: <5 mg/dL — ABNORMAL LOW (ref 6–20)
CO2: 23 mmol/L (ref 22–32)
Calcium: 8.3 mg/dL — ABNORMAL LOW (ref 8.9–10.3)
Chloride: 101 mmol/L (ref 101–111)
Creatinine, Ser: 0.77 mg/dL (ref 0.44–1.00)
GFR calc Af Amer: >60 mL/min (ref >60)
GFR calc non Af Amer: >60 mL/min (ref >60)
Glucose, Bld: 121 mg/dL — ABNORMAL HIGH (ref 65–99)
Potassium: 3.9 mmol/L (ref 3.5–5.1)
Sodium: 133 mmol/L — ABNORMAL LOW (ref 135–145)
Total Bilirubin: 1.2 mg/dL (ref 0.3–1.2)
Total Protein: 6.2 g/dL — ABNORMAL LOW (ref 6.5–8.1)

## 2015-07-22 LAB — AMYLASE: Amylase: 94 U/L (ref 28–100)

## 2015-07-22 LAB — LIPASE, BLOOD: Lipase: 103 U/L — ABNORMAL HIGH (ref 11–51)

## 2015-07-22 MED ORDER — HYDROMORPHONE HCL 1 MG/ML IJ SOLN
0.5000 mg | INTRAMUSCULAR | Status: DC | PRN
Start: 2015-07-22 — End: 2015-07-23
  Administered 2015-07-22 – 2015-07-23 (×6): 1 mg via INTRAVENOUS
  Filled 2015-07-22 (×5): qty 1

## 2015-07-22 MED ORDER — IPRATROPIUM-ALBUTEROL 0.5-2.5 (3) MG/3ML IN SOLN: 3.0000 mL | Freq: Four times a day (QID) | RESPIRATORY_TRACT | Status: DC | PRN

## 2015-07-22 NOTE — Progress Notes (Signed)
Patient had several episodes of vomiting yesterday evening and throughout night.Patient was ask to cut back to only ice chips for the night. Patient also complained of an increase in pain due to vomiting.  Dr. Georgette Dover was notified and Zofran was ordered as well as IV dilaudid.  Her nausea and pain have been well controled since, although the patient has been unable to sleep during night. IV Lopressor was also given once during night due to an increased heart rate.  Will continue to monitor. Jimmie Molly, RN
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
## 2015-07-23 DIAGNOSIS — J189 Pneumonia, unspecified organism: Secondary | ICD-10-CM | POA: Diagnosis not present

## 2015-07-23 MED ORDER — ENSURE ENLIVE PO LIQD
237.0000 mL | Freq: Two times a day (BID) | ORAL | Status: DC
  Administered 2015-07-24 (×2): 237 mL via ORAL

## 2015-07-23 MED ORDER — HYDROMORPHONE HCL 1 MG/ML IJ SOLN
1.0000 mg | INTRAMUSCULAR | Status: DC | PRN
  Administered 2015-07-25 (×2): 1 mg via INTRAVENOUS
  Filled 2015-07-23 (×2): qty 1

## 2015-07-23 MED ORDER — HYDROCODONE-ACETAMINOPHEN 10-325 MG PO TABS
0.5000 | ORAL_TABLET | ORAL | Status: DC | PRN
  Administered 2015-07-23 – 2015-07-25 (×6): 2 via ORAL
  Filled 2015-07-23 (×6): qty 2

## 2015-07-23 MED ORDER — NAPROXEN 250 MG PO TABS
500.0000 mg | ORAL_TABLET | Freq: Two times a day (BID) | ORAL | Status: DC
Start: 2015-07-23 — End: 2015-07-25
  Administered 2015-07-23 – 2015-07-24 (×4): 500 mg via ORAL
  Filled 2015-07-23 (×4): qty 2
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
## 2015-07-24 LAB — CBC
HCT: 27.9 % — ABNORMAL LOW (ref 36.0–46.0)
Hemoglobin: 9.2 g/dL — ABNORMAL LOW (ref 12.0–15.0)
MCH: 28.8 pg (ref 26.0–34.0)
MCHC: 33 g/dL (ref 30.0–36.0)
MCV: 87.2 fL (ref 78.0–100.0)
PLATELETS: 689 10*3/uL — AB (ref 150–400)
RBC: 3.2 MIL/uL — AB (ref 3.87–5.11)
RDW: 16.5 % — ABNORMAL HIGH (ref 11.5–15.5)
WBC: 19.6 10*3/uL — AB (ref 4.0–10.5)

## 2015-07-24 MED ORDER — POLYETHYLENE GLYCOL 3350 17 G PO PACK
17.0000 g | PACK | Freq: Every day | ORAL | Status: DC
  Administered 2015-07-24: 17 g via ORAL
  Filled 2015-07-24: qty 1

## 2015-07-24 MED ORDER — DOCUSATE SODIUM 100 MG PO CAPS
100.0000 mg | ORAL_CAPSULE | Freq: Two times a day (BID) | ORAL | Status: DC
Start: 2015-07-24 — End: 2015-07-25
  Administered 2015-07-24 (×2): 100 mg via ORAL
  Filled 2015-07-24 (×2): qty 1

## 2015-07-24 NOTE — Progress Notes (Signed)
Pt for possible dc home tomorrow.  Noted PT recommending HH at dc; pt has Medicaid, but does not have qualifying diagnosis to receive home therapy with Medicaid.  May set up [facility], if needed for drain and wound care.  Will follow.    Reinaldo Raddle, RN, BSN  Trauma/Neuro ICU Case Manager [phone]
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
## 2015-07-25 MED ORDER — LEVOFLOXACIN 750 MG PO TABS: 750.0000 mg | ORAL_TABLET | ORAL | Status: DC

## 2015-07-25 MED ORDER — NAPROXEN 500 MG PO TABS: 500.0000 mg | ORAL_TABLET | Freq: Two times a day (BID) | ORAL | Status: DC

## 2015-07-25 MED ORDER — HYDROCODONE-ACETAMINOPHEN 10-325 MG PO TABS: 1.0000 | ORAL_TABLET | ORAL | Status: DC | PRN

## 2015-07-25 NOTE — Discharge Instructions (Signed)
No lifting more than 5 pounds for 6 weeks from the date of surgery.  No driving while taking oxycodone.
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
## 2015-07-27 ENCOUNTER — Emergency Department (HOSPITAL_COMMUNITY)
Admission: EM | Admit: 2015-07-27 | Discharge: 2015-07-27 | Discharge status: Against Medical Advice | Payer: Medicaid Other | Attending: Emergency Medicine | Admitting: Emergency Medicine

## 2015-07-27 ENCOUNTER — Encounter (HOSPITAL_COMMUNITY): Payer: Self-pay | Admitting: Emergency Medicine

## 2015-07-27 DIAGNOSIS — R109 Unspecified abdominal pain: Secondary | ICD-10-CM | POA: Insufficient documentation

## 2015-07-27 HISTORY — DX: Accidental discharge from unspecified firearms or gun, initial encounter: W34.00XA

## 2015-07-27 LAB — CBC
HCT: 29.4 % — ABNORMAL LOW (ref 36.0–46.0)
HEMOGLOBIN: 9.5 g/dL — AB (ref 12.0–15.0)
MCH: 27.9 pg (ref 26.0–34.0)
MCHC: 32.3 g/dL (ref 30.0–36.0)
MCV: 86.5 fL (ref 78.0–100.0)
PLATELETS: 977 10*3/uL — AB (ref 150–400)
RBC: 3.4 MIL/uL — AB (ref 3.87–5.11)
RDW: 16.2 % — ABNORMAL HIGH (ref 11.5–15.5)
WBC: 12.4 10*3/uL — ABNORMAL HIGH (ref 4.0–10.5)

## 2015-07-27 LAB — URINE MICROSCOPIC-ADD ON

## 2015-07-27 LAB — URINALYSIS, ROUTINE W REFLEX MICROSCOPIC
Bilirubin Urine: NEGATIVE
Glucose, UA: NEGATIVE mg/dL
Ketones, ur: NEGATIVE mg/dL
NITRITE: NEGATIVE
SPECIFIC GRAVITY, URINE: 1.019 (ref 1.005–1.030)
pH: 7 (ref 5.0–8.0)

## 2015-07-27 LAB — COMPREHENSIVE METABOLIC PANEL
ALK PHOS: 75 U/L (ref 38–126)
ALT: 28 U/L (ref 14–54)
ANION GAP: 10 (ref 5–15)
AST: 32 U/L (ref 15–41)
Albumin: 2.6 g/dL — ABNORMAL LOW (ref 3.5–5.0)
BUN: 9 mg/dL (ref 6–20)
CALCIUM: 8.9 mg/dL (ref 8.9–10.3)
CO2: 25 mmol/L (ref 22–32)
CREATININE: 0.8 mg/dL (ref 0.44–1.00)
Chloride: 100 mmol/L — ABNORMAL LOW (ref 101–111)
GFR calc non Af Amer: >60 mL/min (ref >60)
Glucose, Bld: 119 mg/dL — ABNORMAL HIGH (ref 65–99)
Potassium: 4.2 mmol/L (ref 3.5–5.1)
SODIUM: 135 mmol/L (ref 135–145)
TOTAL PROTEIN: 7.3 g/dL (ref 6.5–8.1)
Total Bilirubin: 0.7 mg/dL (ref 0.3–1.2)

## 2015-07-27 LAB — LIPASE, BLOOD: Lipase: 124 U/L — ABNORMAL HIGH (ref 11–51)

## 2015-07-27 LAB — POC URINE PREG, ED: PREG TEST UR: NEGATIVE

## 2015-07-27 NOTE — ED Notes (Signed)
No answer in waiting room 
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
## 2015-07-27 NOTE — ED Notes (Signed)
High platelet count

## 2015-07-28 DIAGNOSIS — K651 Peritoneal abscess: Secondary | ICD-10-CM

## 2015-07-28 HISTORY — DX: Peritoneal abscess: K65.1

## 2015-07-28 NOTE — L&D Delivery Note (Signed)
Delivery Note Patient presented for SROM/SOL around noon and had labor augmented with pitocin. She was complete aroun 2140, and at 10:15 PM a viable female was delivered via Vaginal, Spontaneous Delivery (Presentation: vertex; LOA).  APGAR: 9, 9; weight 6 lb 13.5 oz (3105 g).   Placenta status: spontaneous, intact.  Cord: 3-vessel.   Anesthesia: Epidural  Episiotomy: None Lacerations: Periurethral Suture Repair: 3.0 vicryl Est. Blood Loss (mL): 100  Mom to postpartum.  Baby to Couplet care / Skin to Skin.  Darci Needle, MD [place], PGY-2 06/22/2016, 10:39 PM  OB FELLOW DELIVERY ATTESTATION  I was gloved and present for the delivery in its entirety, and I agree with the above resident's note.    Katherine Basset, DO OB Fellow

## 2015-07-30 ENCOUNTER — Telehealth (HOSPITAL_COMMUNITY): Payer: Self-pay | Admitting: Orthopedic Surgery

## 2015-07-30 MED ORDER — ONDANSETRON HCL 4 MG PO TABS: 4.0000 mg | ORAL_TABLET | ORAL | Status: DC | PRN

## 2015-07-30 NOTE — Telephone Encounter (Signed)
HHRN had called last week and left a message that pt was having N/V and there was a problem with the JP not charging. I called the patient who confirmed the N/V was still present and that fluid was leaking around the drain but nothing was coming out the drain. I called in a rx for Zofran and told her we would evaluate the drain tomorrow in clinic.

## 2015-07-31 ENCOUNTER — Ambulatory Visit
Admission: AD | Admit: 2015-07-31 | Discharge: 2015-07-31 | Disposition: A | Discharge status: Home / Self Care | Payer: Medicaid Other | Source: Ambulatory Visit | Attending: General Surgery | Admitting: General Surgery

## 2015-07-31 ENCOUNTER — Other Ambulatory Visit (HOSPITAL_COMMUNITY): Payer: Self-pay | Admitting: General Surgery

## 2015-07-31 ENCOUNTER — Ambulatory Visit
Admission: RE | Admit: 2015-07-31 | Discharge: 2015-07-31 | Disposition: A | Discharge status: Home / Self Care | Payer: Medicaid Other | Source: Ambulatory Visit | Attending: General Surgery | Admitting: General Surgery

## 2015-07-31 ENCOUNTER — Other Ambulatory Visit: Payer: Self-pay | Admitting: General Surgery

## 2015-07-31 DIAGNOSIS — J9811 Atelectasis: Secondary | ICD-10-CM | POA: Diagnosis not present

## 2015-07-31 DIAGNOSIS — S36209A Unspecified injury of unspecified part of pancreas, initial encounter: Secondary | ICD-10-CM

## 2015-07-31 DIAGNOSIS — S36119A Unspecified injury of liver, initial encounter: Secondary | ICD-10-CM | POA: Diagnosis not present

## 2015-07-31 DIAGNOSIS — S36201A Unspecified injury of body of pancreas, initial encounter: Secondary | ICD-10-CM | POA: Diagnosis not present

## 2015-07-31 DIAGNOSIS — S36030A Superficial (capsular) laceration of spleen, initial encounter: Secondary | ICD-10-CM | POA: Insufficient documentation

## 2015-07-31 DIAGNOSIS — S37002A Unspecified injury of left kidney, initial encounter: Secondary | ICD-10-CM | POA: Diagnosis not present

## 2015-07-31 DIAGNOSIS — S36202A Unspecified injury of tail of pancreas, initial encounter: Secondary | ICD-10-CM | POA: Insufficient documentation

## 2015-07-31 DIAGNOSIS — W3400XA Accidental discharge from unspecified firearms or gun, initial encounter: Secondary | ICD-10-CM | POA: Diagnosis not present

## 2015-07-31 DIAGNOSIS — J90 Pleural effusion, not elsewhere classified: Secondary | ICD-10-CM | POA: Insufficient documentation

## 2015-07-31 LAB — PATHOLOGIST SMEAR REVIEW

## 2015-07-31 MED ORDER — IOHEXOL 300 MG/ML  SOLN
100.0000 mL | Freq: Once | INTRAMUSCULAR | Status: AC | PRN
  Administered 2015-07-31: 100 mL via INTRAVENOUS

## 2015-08-02 ENCOUNTER — Other Ambulatory Visit (HOSPITAL_COMMUNITY): Payer: Self-pay | Admitting: General Surgery

## 2015-08-02 DIAGNOSIS — S36209A Unspecified injury of unspecified part of pancreas, initial encounter: Secondary | ICD-10-CM

## 2015-08-05 ENCOUNTER — Other Ambulatory Visit: Payer: Self-pay | Admitting: Radiology

## 2015-08-06 ENCOUNTER — Encounter (HOSPITAL_COMMUNITY): Payer: Self-pay

## 2015-08-06 ENCOUNTER — Ambulatory Visit (HOSPITAL_COMMUNITY)
Admission: RE | Admit: 2015-08-06 | Discharge: 2015-08-06 | Disposition: A | Discharge status: Home / Self Care | Payer: Medicaid Other | Source: Ambulatory Visit | Attending: General Surgery | Admitting: General Surgery

## 2015-08-06 DIAGNOSIS — Z0189 Encounter for other specified special examinations: Secondary | ICD-10-CM | POA: Diagnosis not present

## 2015-08-06 DIAGNOSIS — Z4682 Encounter for fitting and adjustment of non-vascular catheter: Secondary | ICD-10-CM | POA: Insufficient documentation

## 2015-08-06 DIAGNOSIS — S36209A Unspecified injury of unspecified part of pancreas, initial encounter: Secondary | ICD-10-CM | POA: Diagnosis present

## 2015-08-06 DIAGNOSIS — R188 Other ascites: Secondary | ICD-10-CM | POA: Insufficient documentation

## 2015-08-06 DIAGNOSIS — S31109A Unspecified open wound of abdominal wall, unspecified quadrant without penetration into peritoneal cavity, initial encounter: Secondary | ICD-10-CM | POA: Diagnosis present

## 2015-08-06 DIAGNOSIS — W3400XA Accidental discharge from unspecified firearms or gun, initial encounter: Secondary | ICD-10-CM | POA: Insufficient documentation

## 2015-08-06 HISTORY — PX: DRAINAGE ABD/PERITON ABS PERC (ARMC HX): HXRAD1700

## 2015-08-06 LAB — CBC WITH DIFFERENTIAL/PLATELET
BASOS ABS: 0.1 10*3/uL (ref 0.0–0.1)
BASOS PCT: 1 %
Eosinophils Absolute: 0.2 10*3/uL (ref 0.0–0.7)
Eosinophils Relative: 3 %
HEMATOCRIT: 32.8 % — AB (ref 36.0–46.0)
HEMOGLOBIN: 10.2 g/dL — AB (ref 12.0–15.0)
LYMPHS PCT: 30 %
Lymphs Abs: 2.3 10*3/uL (ref 0.7–4.0)
MCH: 27.4 pg (ref 26.0–34.0)
MCHC: 31.1 g/dL (ref 30.0–36.0)
MCV: 88.2 fL (ref 78.0–100.0)
MONO ABS: 0.6 10*3/uL (ref 0.1–1.0)
MONOS PCT: 8 %
NEUTROS ABS: 4.5 10*3/uL (ref 1.7–7.7)
NEUTROS PCT: 58 %
Platelets: 500 10*3/uL — ABNORMAL HIGH (ref 150–400)
RBC: 3.72 MIL/uL — ABNORMAL LOW (ref 3.87–5.11)
RDW: 15.1 % (ref 11.5–15.5)
WBC: 7.6 10*3/uL (ref 4.0–10.5)

## 2015-08-06 LAB — HCG, SERUM, QUALITATIVE: PREG SERUM: NEGATIVE

## 2015-08-06 LAB — BASIC METABOLIC PANEL
ANION GAP: 9 (ref 5–15)
BUN: 12 mg/dL (ref 6–20)
CALCIUM: 9.6 mg/dL (ref 8.9–10.3)
CO2: 27 mmol/L (ref 22–32)
Chloride: 108 mmol/L (ref 101–111)
Creatinine, Ser: 1.03 mg/dL — ABNORMAL HIGH (ref 0.44–1.00)
GFR calc non Af Amer: >60 mL/min (ref >60)
GLUCOSE: 96 mg/dL (ref 65–99)
POTASSIUM: 4.1 mmol/L (ref 3.5–5.1)
Sodium: 144 mmol/L (ref 135–145)

## 2015-08-06 LAB — PROTIME-INR
INR: 1.11 (ref 0.00–1.49)
Prothrombin Time: 14.5 seconds (ref 11.6–15.2)

## 2015-08-06 MED ORDER — FENTANYL CITRATE (PF) 100 MCG/2ML IJ SOLN
INTRAMUSCULAR | Status: AC | PRN
  Administered 2015-08-06: 50 ug via INTRAVENOUS
  Administered 2015-08-06: 25 ug via INTRAVENOUS

## 2015-08-06 MED ORDER — MIDAZOLAM HCL 2 MG/2ML IJ SOLN
INTRAMUSCULAR | Status: AC
  Filled 2015-08-06: qty 6

## 2015-08-06 MED ORDER — SODIUM CHLORIDE 0.9 % IV SOLN: INTRAVENOUS | Status: DC

## 2015-08-06 MED ORDER — FENTANYL CITRATE (PF) 100 MCG/2ML IJ SOLN
INTRAMUSCULAR | Status: AC
  Filled 2015-08-06: qty 4

## 2015-08-06 MED ORDER — HYDROCODONE-ACETAMINOPHEN 5-325 MG PO TABS
1.0000 | ORAL_TABLET | ORAL | Status: DC | PRN
  Administered 2015-08-06: 2 via ORAL
  Filled 2015-08-06 (×3): qty 2

## 2015-08-06 MED ORDER — MIDAZOLAM HCL 2 MG/2ML IJ SOLN
INTRAMUSCULAR | Status: AC | PRN
Start: 2015-08-06 — End: 2015-08-06
  Administered 2015-08-06 (×4): 1 mg via INTRAVENOUS
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
## 2015-08-06 NOTE — Discharge Instructions (Signed)
Percutaneous Abscess Drain An abscess is a collection of infected fluid inside the body. Your health care provider may decide to remove or drain the infected fluid from the area by placing a thin needle into the abscess. Usually, a small tube is left in place to drain the abscess fluid. The abscess fluid may take a few days to drain. LET [facility] CARE PROVIDER KNOW ABOUT:  Any allergies you have.  All medicines you are taking, including vitamins, herbs, eye drops, creams, and over-the-counter medicines. This includes steroid medicines by mouth or cream.  Previous problems you or members of your family have had with the use of anesthetics.  Any blood disorders you have.  Previous surgeries you have had.  Possibility of pregnancy, if this applies.  Medical conditions you have.  Any history of smoking. RISKS AND COMPLICATIONS Generally, this is a safe procedure. However, problems can occur and include:   Infection.  Allergic reaction to materials used (such as contrast dye).  Damage to a nearby organ or tissue.  Bleeding.  Blockage of a tube placed to drain the abscess, requiring placement of a new drainage tube.  A need to repeat the procedure.  Failure of the procedure to adequately drain the abscess, requiring an open surgical procedure to do so. BEFORE THE PROCEDURE   Ask your health care provider about:  Changing or stopping your regular medicines. This is especially important if you are taking diabetes medicines or blood thinners.  Taking medicines such as aspirin and ibuprofen. These medicines can thin your blood. Do not take these medicines before your procedure if your health care provider asks you not to.  Your health care provider may do some blood or urine tests. These will help your health care provider learn how well your kidneys and liver are working and how well your blood clots.  Do not eat or drink anything after midnight on the night before the  procedure or as directed by your health care provider.  Make arrangements for someone to drive you home after the procedure.  PROCEDURE   An IV tube will be placed in your arm. Medicine will be able to flow directly into your body through this tube.  You will lie on an X-ray table.  Your heart rate, blood pressure, and breathing will be monitored.   Your oxygen level will also be watched during the procedure. Supplemental oxygen may be given if necessary.  The skin around the area where the drainage tube (catheter) will be placed will be cleaned and numbed.  A small cut (incision) will then be made to insert the drainage tube. The drainage tube will be inserted using X-ray or CT scan to help direct where it should be placed.  The drainage tube will be guided into the abscess to drain the infected fluid.  The drainage tube may stay in place and be connected to a bag outside your body. It will stay until the fluid has stopped draining and the infection is gone. AFTER THE PROCEDURE  You will be taken to a recovery area where you will stay until the medicines have worn off.  You will stay in bed for several hours.  Your progress will be monitored.   Your blood pressure and pulse will be checked often.   The area of the incision will be checked often.  You may have some pain or feel sick. Tell your health care provider.  As you begin to feel better, you may be given ice,  fluids, and food.   When you can walk, drink, eat, and use the bathroom, you may be able to go home.   This information is not intended to replace advice given to you by your health care provider. Make sure you discuss any questions you have with your health care provider.   Document Released: 11/27/2013 Document Reviewed: 11/27/2013 Elsevier Interactive Patient Education 2016 [employer] Percutaneous Abscess Drain, Care After Refer to this sheet in the next few weeks. These instructions provide you  with information on caring for yourself after your procedure. Your health care provider may also give you more specific instructions. Your treatment has been planned according to current medical practices, but problems sometimes occur. Call your health care provider if you have any problems or questions after your procedure. WHAT TO EXPECT AFTER THE PROCEDURE After your procedure, it is typical to have the following:   A small amount of discomfort in the area where the drainage tube was placed.  A small amount of bruising around the area where the drainage tube was placed.  Sleepiness and fatigue for the rest of the day from the medicines used. HOME CARE INSTRUCTIONS  Rest at home for 1-2 days following your procedure or as directed by your health care provider.  If you go home right after the procedure, plan to have someone with you for 24 hours.  Do not take a bathor shower for 24 hours after your procedure.  Take medicines only as directed by your health care provider. Ask your health care provider when you can resume taking any normal medicines.  Change bandages (dressings) as directed.   You may be told to record the amount of drainage from the bag every time you empty it. Follow your health care provider's directions for emptying the bag. Write down the amount of drainage, the date, and the time you emptied it.  Call your health care provider when the drain is putting out less than 10 mL of drainage per day for 2-3 days in a row or as directed by your health care provider.  Follow your health care provider's instructions for cleaning the drainage tube. You may need to clean the tube every day so that it does not clog. SEEK MEDICAL CARE IF:  You have increased bleeding (more than a small spot) from the site where the drainage tube was placed.  You have redness, swelling, or increasing pain around the site where the drainage tube was placed.  You notice a discharge or bad smell  coming from the site where the drainage tube was placed.  You have a fever or chills.  You have pain that is not helped by medicine.  SEEK IMMEDIATE MEDICAL CARE IF:  There is leakage around the drainage tube.  The drainage tube pulls out.  You suddenly stop having drainage from the tube.  You suddenly have blood in the drainage fluid.  You become dizzy or faint.  You develop a rash.   You have nausea or vomiting.  You have difficulty breathing, feel short of breath, or feel faint.   You develop chest pain.  You have problems with your speech or vision.  You have trouble balancing or moving your arms or legs.   This information is not intended to replace advice given to you by your health care provider. Make sure you discuss any questions you have with your health care provider.   Document Released: 11/27/2013 Document Revised: 05/01/2014 Document Reviewed: 11/27/2013 Elsevier Interactive Patient Education  2016 [employer] Moderate Conscious Sedation, Adult Sedation is the use of medicines to promote relaxation and relieve discomfort and anxiety. Moderate conscious sedation is a type of sedation. Under moderate conscious sedation you are less alert than normal but are still able to respond to instructions or stimulation. Moderate conscious sedation is used during short medical and dental procedures. It is milder than deep sedation or general anesthesia and allows you to return to your regular activities sooner. LET [facility] CARE PROVIDER KNOW ABOUT:   Any allergies you have.  All medicines you are taking, including vitamins, herbs, eye drops, creams, and over-the-counter medicines.  Use of steroids (by mouth or creams).  Previous problems you or members of your family have had with the use of anesthetics.  Any blood disorders you have.  Previous surgeries you have had.  Medical conditions you have.  Possibility of pregnancy, if this applies.  Use of  cigarettes, alcohol, or illegal drugs. RISKS AND COMPLICATIONS Generally, this is a safe procedure. However, as with any procedure, problems can occur. Possible problems include:  Oversedation.  Trouble breathing on your own. You may need to have a breathing tube until you are awake and breathing on your own.  Allergic reaction to any of the medicines used for the procedure. BEFORE THE PROCEDURE  You may have blood tests done. These tests can help show how well your kidneys and liver are working. They can also show how well your blood clots.  A physical exam will be done.  Only take medicines as directed by your health care provider. You may need to stop taking medicines (such as blood thinners, aspirin, or nonsteroidal anti-inflammatory drugs) before the procedure.   Do not eat or drink at least 6 hours before the procedure or as directed by your health care provider.  Arrange for a responsible adult, family member, or friend to take you home after the procedure. He or she should stay with you for at least 24 hours after the procedure, until the medicine has worn off. PROCEDURE   An intravenous (IV) catheter will be inserted into one of your veins. Medicine will be able to flow directly into your body through this catheter. You may be given medicine through this tube to help prevent pain and help you relax.  The medical or dental procedure will be done. AFTER THE PROCEDURE  You will stay in a recovery area until the medicine has worn off. Your blood pressure and pulse will be checked.   Depending on the procedure you had, you may be allowed to go home when you can tolerate liquids and your pain is under control.   This information is not intended to replace advice given to you by your health care provider. Make sure you discuss any questions you have with your health care provider.   Document Released: 04/07/2001 Document Revised: 08/03/2014 Document Reviewed:  03/20/2013 Elsevier Interactive Patient Education [employer].

## 2015-08-07 ENCOUNTER — Other Ambulatory Visit: Payer: Self-pay | Admitting: Radiology

## 2015-08-07 DIAGNOSIS — R188 Other ascites: Secondary | ICD-10-CM

## 2015-08-08 ENCOUNTER — Other Ambulatory Visit: Payer: Self-pay | Admitting: *Deleted

## 2015-08-08 ENCOUNTER — Other Ambulatory Visit: Payer: Self-pay | Admitting: General Surgery

## 2015-08-08 DIAGNOSIS — R188 Other ascites: Secondary | ICD-10-CM

## 2015-08-12 LAB — CULTURE, ROUTINE-ABSCESS: SPECIAL REQUESTS: NORMAL

## 2015-08-21 ENCOUNTER — Ambulatory Visit
Admission: RE | Admit: 2015-08-21 | Discharge: 2015-08-21 | Disposition: A | Discharge status: Home / Self Care | Payer: Medicaid Other | Source: Ambulatory Visit | Attending: General Surgery | Admitting: General Surgery

## 2015-08-21 ENCOUNTER — Other Ambulatory Visit (HOSPITAL_COMMUNITY): Payer: Self-pay | Admitting: Interventional Radiology

## 2015-08-21 ENCOUNTER — Ambulatory Visit
Admission: RE | Admit: 2015-08-21 | Discharge: 2015-08-21 | Disposition: A | Discharge status: Home / Self Care | Payer: Medicaid Other | Source: Ambulatory Visit | Attending: Radiology | Admitting: Radiology

## 2015-08-21 DIAGNOSIS — R188 Other ascites: Secondary | ICD-10-CM

## 2015-08-21 LAB — CREATININE WITH EST GFR
Creat: 0.69 mg/dL (ref 0.50–1.10)
GFR, Est African American: >89 mL/min (ref >=60)

## 2015-08-21 LAB — BUN: BUN: 8 mg/dL (ref 7–25)

## 2015-08-21 MED ORDER — IOPAMIDOL (ISOVUE-300) INJECTION 61%
100.0000 mL | Freq: Once | INTRAVENOUS | Status: AC | PRN
  Administered 2015-08-21: 100 mL via INTRAVENOUS
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
## 2015-08-22 ENCOUNTER — Other Ambulatory Visit: Payer: Self-pay | Admitting: Radiology

## 2015-08-23 ENCOUNTER — Encounter (HOSPITAL_COMMUNITY): Payer: Self-pay

## 2015-08-23 ENCOUNTER — Ambulatory Visit (HOSPITAL_COMMUNITY)
Admission: RE | Admit: 2015-08-23 | Discharge: 2015-08-23 | Disposition: A | Discharge status: Home / Self Care | Payer: Medicaid Other | Source: Ambulatory Visit | Attending: Interventional Radiology | Admitting: Interventional Radiology

## 2015-08-23 DIAGNOSIS — R188 Other ascites: Secondary | ICD-10-CM

## 2015-08-23 DIAGNOSIS — K651 Peritoneal abscess: Secondary | ICD-10-CM | POA: Diagnosis not present

## 2015-08-23 DIAGNOSIS — T814XXD Infection following a procedure, subsequent encounter: Secondary | ICD-10-CM | POA: Diagnosis present

## 2015-08-23 DIAGNOSIS — Z9889 Other specified postprocedural states: Secondary | ICD-10-CM | POA: Insufficient documentation

## 2015-08-23 DIAGNOSIS — Z79899 Other long term (current) drug therapy: Secondary | ICD-10-CM | POA: Insufficient documentation

## 2015-08-23 DIAGNOSIS — T814XXA Infection following a procedure, initial encounter: Secondary | ICD-10-CM | POA: Insufficient documentation

## 2015-08-23 LAB — CBC WITH DIFFERENTIAL/PLATELET
Basophils Absolute: 0 10*3/uL (ref 0.0–0.1)
Basophils Relative: 0 %
EOS ABS: 0.2 10*3/uL (ref 0.0–0.7)
EOS PCT: 2 %
HCT: 33.7 % — ABNORMAL LOW (ref 36.0–46.0)
Hemoglobin: 10.6 g/dL — ABNORMAL LOW (ref 12.0–15.0)
LYMPHS ABS: 2 10*3/uL (ref 0.7–4.0)
Lymphocytes Relative: 23 %
MCH: 26.6 pg (ref 26.0–34.0)
MCHC: 31.5 g/dL (ref 30.0–36.0)
MCV: 84.5 fL (ref 78.0–100.0)
MONO ABS: 0.8 10*3/uL (ref 0.1–1.0)
MONOS PCT: 9 %
Neutro Abs: 5.8 10*3/uL (ref 1.7–7.7)
Neutrophils Relative %: 66 %
PLATELETS: 308 10*3/uL (ref 150–400)
RBC: 3.99 MIL/uL (ref 3.87–5.11)
RDW: 14.4 % (ref 11.5–15.5)
WBC: 8.8 10*3/uL (ref 4.0–10.5)

## 2015-08-23 LAB — PREGNANCY, URINE: PREG TEST UR: NEGATIVE

## 2015-08-23 MED ORDER — OXYCODONE-ACETAMINOPHEN 5-325 MG PO TABS
1.0000 | ORAL_TABLET | Freq: Once | ORAL | Status: AC
  Administered 2015-08-23: 1 via ORAL

## 2015-08-23 MED ORDER — SODIUM CHLORIDE 0.9 % IV SOLN
INTRAVENOUS | Status: DC
  Administered 2015-08-23: 08:00:00 via INTRAVENOUS

## 2015-08-23 MED ORDER — LIDOCAINE HCL 1 % IJ SOLN
INTRAMUSCULAR | Status: AC
  Filled 2015-08-23: qty 20

## 2015-08-23 MED ORDER — MIDAZOLAM HCL 2 MG/2ML IJ SOLN
INTRAMUSCULAR | Status: AC
  Filled 2015-08-23: qty 2

## 2015-08-23 MED ORDER — FENTANYL CITRATE (PF) 100 MCG/2ML IJ SOLN
INTRAMUSCULAR | Status: AC | PRN
  Administered 2015-08-23: 25 ug via INTRAVENOUS
  Administered 2015-08-23: 50 ug via INTRAVENOUS

## 2015-08-23 MED ORDER — MIDAZOLAM HCL 2 MG/2ML IJ SOLN
INTRAMUSCULAR | Status: AC | PRN
  Administered 2015-08-23 (×2): 1 mg via INTRAVENOUS

## 2015-08-23 MED ORDER — FENTANYL CITRATE (PF) 100 MCG/2ML IJ SOLN
INTRAMUSCULAR | Status: AC
  Filled 2015-08-23: qty 2

## 2015-08-23 MED ORDER — PIPERACILLIN-TAZOBACTAM 3.375 G IVPB
3.3750 g | INTRAVENOUS | Status: AC
  Administered 2015-08-23: 3.375 g via INTRAVENOUS
  Filled 2015-08-23: qty 50

## 2015-08-23 MED ORDER — OXYCODONE-ACETAMINOPHEN 5-325 MG PO TABS
ORAL_TABLET | ORAL | Status: AC
  Administered 2015-08-23: 1 via ORAL
  Filled 2015-08-23: qty 1

## 2015-08-23 NOTE — Sedation Documentation (Signed)
Patient is resting comfortably. No complaints of pain. Vitals stable.

## 2015-08-23 NOTE — Sedation Documentation (Signed)
Patient is resting comfortably. No complaints of pain. Vitals stable

## 2015-08-23 NOTE — Discharge Instructions (Signed)
Ascites Drainage Catheter Placement, Care After Refer to this sheet in the next few weeks. These instructions provide you with information about caring for yourself after your procedure. Your health care provider may also give you more specific instructions. Your treatment has been planned according to current medical practices, but problems sometimes occur. Call your health care provider if you have any problems or questions after your procedure. WHAT TO EXPECT AFTER THE PROCEDURE: After your procedure, it is common to have:  Pain, soreness, and bruising near the area where the catheter was placed.  Drowsiness. This may last for several hours.  Trouble remembering things. The medicine that was given to help you relax during the procedure (sedative) may take several hours to fully wear off. HOME CARE INSTRUCTIONS  Rest, but do not spend the whole day in bed. Walk around to stay active.  Weigh yourself every day. A change in weight may mean that too much fluid is building up in your body. Ask how much change in your weight would give you a reason to call or visit your health care provider.  Take medicines only as directed by your health care provider.  Keep all follow-up visits as directed by your health care provider. This is important.  If you are asked to drain fluid at home, follow instructions from your health care provider about how to do this.  Follow any dietary instructions from your health care provider. You may need to:  Limit foods and liquids that increase or decrease how much fluid your body holds onto.  Limit how much salt (sodium) you eat.  Eat a high-protein diet.  Avoid alcohol.  There are many different ways to close and cover an incision, including stitches (sutures), skin glue, and adhesive strips. Follow your health care provider's instructions about:  Incision care.  Bandage (dressing) changes and removal.  Incision closure removal.  Check your incision  area every day for signs of infection. Watch for:  Redness, swelling, or pain.  Fluid, blood, or pus. SEEK MEDICAL CARE IF:  You have skin irritation, redness, or pain where the catheter was inserted (insertion site).  You have trouble draining fluid.  You have trouble caring for your catheter.  You have abdominal pain, bloating, or cramping. SEEK IMMEDIATE MEDICAL CARE IF:  You develop a fever or chills.  You have increasing redness or increasing pain around the insertion site.  You have blood or pus coming from the insertion site.  Fluid that drains out of the catheter becomes cloudy or has a bad smell.  You have abdominal pain or cramping that worsens or does not go away.   This information is not intended to replace advice given to you by your health care provider. Make sure you discuss any questions you have with your health care provider.   Document Released: 07/02/2011 Document Revised: 11/27/2014 Document Reviewed: 07/09/2014 Elsevier Interactive Patient Education [employer].

## 2015-08-23 NOTE — Sedation Documentation (Signed)
Vital signs stable. Pt resting 
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
## 2015-08-23 NOTE — Sedation Documentation (Addendum)
Pt arrived to unit with LUQ abd drain, with bag. Removed by Vernia Buff, MD. Site is unremarkable, little to no drainage.

## 2015-08-23 NOTE — Progress Notes (Signed)
Pt and mother taught how to flush drain and given instructions and supplies to flush drain TID. Mother verbalized back to me and states understanding.  Drain flushed with 5cc with only 2cc bloody return.
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
## 2015-08-26 ENCOUNTER — Emergency Department (HOSPITAL_COMMUNITY): Payer: Medicaid Other

## 2015-08-26 ENCOUNTER — Encounter (HOSPITAL_COMMUNITY): Payer: Self-pay | Admitting: Emergency Medicine

## 2015-08-26 ENCOUNTER — Emergency Department (HOSPITAL_COMMUNITY)
Admission: EM | Admit: 2015-08-26 | Discharge: 2015-08-26 | Disposition: A | Discharge status: Home / Self Care | Payer: Medicaid Other | Attending: Emergency Medicine | Admitting: Emergency Medicine

## 2015-08-26 DIAGNOSIS — Z8679 Personal history of other diseases of the circulatory system: Secondary | ICD-10-CM | POA: Insufficient documentation

## 2015-08-26 DIAGNOSIS — Z792 Long term (current) use of antibiotics: Secondary | ICD-10-CM | POA: Diagnosis not present

## 2015-08-26 DIAGNOSIS — R112 Nausea with vomiting, unspecified: Secondary | ICD-10-CM | POA: Insufficient documentation

## 2015-08-26 DIAGNOSIS — R1013 Epigastric pain: Secondary | ICD-10-CM | POA: Diagnosis not present

## 2015-08-26 DIAGNOSIS — Z3202 Encounter for pregnancy test, result negative: Secondary | ICD-10-CM | POA: Diagnosis not present

## 2015-08-26 DIAGNOSIS — R1012 Left upper quadrant pain: Secondary | ICD-10-CM | POA: Diagnosis not present

## 2015-08-26 DIAGNOSIS — Z87828 Personal history of other (healed) physical injury and trauma: Secondary | ICD-10-CM | POA: Diagnosis not present

## 2015-08-26 DIAGNOSIS — Z8719 Personal history of other diseases of the digestive system: Secondary | ICD-10-CM | POA: Diagnosis not present

## 2015-08-26 DIAGNOSIS — Z8619 Personal history of other infectious and parasitic diseases: Secondary | ICD-10-CM | POA: Diagnosis not present

## 2015-08-26 DIAGNOSIS — R1010 Upper abdominal pain, unspecified: Secondary | ICD-10-CM | POA: Diagnosis present

## 2015-08-26 HISTORY — DX: Peritoneal abscess: K65.1

## 2015-08-26 LAB — COMPREHENSIVE METABOLIC PANEL
ALBUMIN: 3.8 g/dL (ref 3.5–5.0)
ALK PHOS: 57 U/L (ref 38–126)
ALT: 8 U/L — ABNORMAL LOW (ref 14–54)
AST: 14 U/L — AB (ref 15–41)
Anion gap: 8 (ref 5–15)
BILIRUBIN TOTAL: 0.9 mg/dL (ref 0.3–1.2)
BUN: 10 mg/dL (ref 6–20)
CALCIUM: 9.5 mg/dL (ref 8.9–10.3)
CO2: 25 mmol/L (ref 22–32)
CREATININE: 0.85 mg/dL (ref 0.44–1.00)
Chloride: 106 mmol/L (ref 101–111)
GFR calc Af Amer: >60 mL/min (ref >60)
GFR calc non Af Amer: >60 mL/min (ref >60)
GLUCOSE: 93 mg/dL (ref 65–99)
Potassium: 3.9 mmol/L (ref 3.5–5.1)
Sodium: 139 mmol/L (ref 135–145)
TOTAL PROTEIN: 7.9 g/dL (ref 6.5–8.1)

## 2015-08-26 LAB — URINE MICROSCOPIC-ADD ON

## 2015-08-26 LAB — CBC WITH DIFFERENTIAL/PLATELET
BASOS PCT: 1 %
Basophils Absolute: 0 10*3/uL (ref 0.0–0.1)
EOS ABS: 0.1 10*3/uL (ref 0.0–0.7)
Eosinophils Relative: 2 %
HEMATOCRIT: 35.7 % — AB (ref 36.0–46.0)
Hemoglobin: 11.5 g/dL — ABNORMAL LOW (ref 12.0–15.0)
Lymphocytes Relative: 30 %
Lymphs Abs: 1.7 10*3/uL (ref 0.7–4.0)
MCH: 27.3 pg (ref 26.0–34.0)
MCHC: 32.2 g/dL (ref 30.0–36.0)
MCV: 84.6 fL (ref 78.0–100.0)
MONO ABS: 0.7 10*3/uL (ref 0.1–1.0)
MONOS PCT: 13 %
NEUTROS ABS: 3.3 10*3/uL (ref 1.7–7.7)
Neutrophils Relative %: 56 %
Platelets: 319 10*3/uL (ref 150–400)
RBC: 4.22 MIL/uL (ref 3.87–5.11)
RDW: 14.1 % (ref 11.5–15.5)
WBC: 5.9 10*3/uL (ref 4.0–10.5)

## 2015-08-26 LAB — URINALYSIS, ROUTINE W REFLEX MICROSCOPIC
GLUCOSE, UA: NEGATIVE mg/dL
Ketones, ur: NEGATIVE mg/dL
Nitrite: NEGATIVE
PH: 6 (ref 5.0–8.0)
Specific Gravity, Urine: >1.03 — ABNORMAL HIGH (ref 1.005–1.030)

## 2015-08-26 LAB — PREGNANCY, URINE: Preg Test, Ur: NEGATIVE

## 2015-08-26 LAB — LIPASE, BLOOD: Lipase: 32 U/L (ref 11–51)

## 2015-08-26 MED ORDER — IOHEXOL 300 MG/ML  SOLN
100.0000 mL | Freq: Once | INTRAMUSCULAR | Status: AC | PRN
  Administered 2015-08-26: 100 mL via INTRAVENOUS

## 2015-08-26 MED ORDER — PROMETHAZINE HCL 25 MG PO TABS: 25.0000 mg | ORAL_TABLET | Freq: Four times a day (QID) | ORAL | Status: DC | PRN

## 2015-08-26 MED ORDER — ONDANSETRON HCL 4 MG/2ML IJ SOLN
4.0000 mg | INTRAMUSCULAR | Status: DC | PRN
  Administered 2015-08-26: 4 mg via INTRAVENOUS
  Filled 2015-08-26: qty 2

## 2015-08-26 MED ORDER — ONDANSETRON 4 MG PO TBDP: 4.0000 mg | ORAL_TABLET | Freq: Three times a day (TID) | ORAL | Status: DC | PRN

## 2015-08-26 MED ORDER — SODIUM CHLORIDE 0.9 % IV SOLN
INTRAVENOUS | Status: DC
  Administered 2015-08-26: 17:00:00 via INTRAVENOUS

## 2015-08-26 MED ORDER — DIATRIZOATE MEGLUMINE & SODIUM 66-10 % PO SOLN
ORAL | Status: AC
  Filled 2015-08-26: qty 30

## 2015-08-26 MED ORDER — MORPHINE SULFATE (PF) 2 MG/ML IV SOLN
2.0000 mg | INTRAVENOUS | Status: DC | PRN
  Administered 2015-08-26: 2 mg via INTRAVENOUS
  Filled 2015-08-26: qty 1

## 2015-08-26 NOTE — ED Notes (Addendum)
Went into patient's room to give medication. Both bottles of contrast sitting on table at bedside. Both bottles are still full. Patient states she is not going to drink the contrast. When asked why, patient shrugs shoulders and wrinkles nose. Asked patient if she would try drinking contrast again after zofran administration. Patient states "no." Advised Mary in CT and Dr Elise Benne. Patient has also pulled tape off of IV line and doubled it back over preventing IV from being held secure at site. Patient states "I did that because the tape was pulling my skin."  Advised patient the need for tape to ensure IV is kept secure and able to use. Patient allowed this nurse to apply one strip of tape to secure IV line.
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
## 2015-08-26 NOTE — Discharge Instructions (Signed)
°Emergency Department Resource Guide °1) Find a Doctor and Pay Out of Pocket °Although you won't have to find out who is covered by your insurance plan, it is a good idea to ask around and get recommendations. You will then need to call the office and see if the doctor you have chosen will accept you as a new patient and what types of options they offer for patients who are self-pay. Some doctors offer discounts or will set up payment plans for their patients who do not have insurance, but you will need to ask so you aren't surprised when you get to your appointment. ° °2) Contact Your Local Health Department °Not all health departments have doctors that can see patients for sick visits, but many do, so it is worth a call to see if yours does. If you don't know where your local health department is, you can check in your phone book. The CDC also has a tool to help you locate your state's health department, and many state websites also have listings of all of their local health departments. ° °3) Find a Walk-in Clinic °If your illness is not likely to be very severe or complicated, you may want to try a walk in clinic. These are popping up all over the country in pharmacies, drugstores, and shopping centers. They're usually staffed by nurse practitioners or physician assistants that have been trained to treat common illnesses and complaints. They're usually fairly quick and inexpensive. However, if you have serious medical issues or chronic medical problems, these are probably not your best option. ° °No Primary Care Doctor: °- Call [facility] at  832-8000 - they can help you locate a primary care doctor that  accepts your insurance, provides certain services, etc. °- Physician Referral Service- 1-800-533-3463 ° °Chronic Pain Problems: °Organization         Address  Phone   Notes  °[facility]  (336) 297-2271 Patients need to be referred by their primary care doctor.  ° °Medication  Assistance: °Organization         Address  Phone   Notes  °[place] [facility] [address] °[place], [place] [address] (336) 641-8030 --Must be a resident of [place] °-- Must have NO insurance coverage whatsoever (no Medicaid/ Medicare, etc.) °-- The pt. MUST have a primary care doctor that directs their care regularly and follows them in the community °  °[facility]  (866) 331-1348   °[employer]  (888) 892-1162   ° °Agencies that provide inexpensive medical care: °Organization         Address  Phone   Notes  °[facility]  (336) 832-8035   °[facility]    (336) 832-7272   °[facility] [address] °[place],  [address] (336) 832-4777   °[facility] [address], °[place] (336) 271-4999   °[facility]    (336) 373-0678   °[facility]    (336) 272-1050   °[facility] ° [address], [place] Phone:  (336) 832-4444, Fax:  (336) 832-4440 Hours of Operation:  9 am - 6 pm, M-F.  Also accepts Medicaid/Medicare and self-pay.  °[facility] ° [address], [place] Phone: (336) 832-3150, Fax: (336) [phone]. Hours of Operation:  8:30 am - 5:30 pm, M-F.  Also accepts Medicaid and self-pay.  °[facility] [place] [address], [place] Phone: (336) 878-6027   °[facility] [address], [place], [place] (336)723-1848, Ext. 123 Mondays & Thursdays: 7-9 AM.  First 15 patients are seen on a first come, first serve basis. °  ° °Medicaid-accepting [place] Providers: ° °Organization         Address  Phone   Notes  °[facility] [address], [place] (336) 641-2100 Also accepts self-pay patients.  °[facility] [address], [place] ° (336) 856-9996   °[facility] [address], [place]  (336) 288-8857   °[facility] [address] [place] [address], [place] (336) 299-7000   °Veita Bland [address], [place]  ° (336) 373-1557 Only accepts [place] Access Medicaid patients after they have their name applied to their card.  ° °Self-Pay (no insurance) in [place]: ° °Organization         Address  Phone   Notes  °Sickle Cell Patients, [facility] [address], [place] (336) 832-1970   °[facility] [address], [place] (336) 832-4400   °[facility] ° [address], [place] (336) 992-4800   °[facility]/Dr. Osei-Bonsu ° [address] [place] [address], [place] or [address], [place] (336) 841-8500 Phone number for both [place] and [place] locations is the same.  °[facility] [address], [place] (336) 299-0000   °[facility] [address] [place] [address], [place] or [address] (336) 852-7530 °(336) 878-2260   °[facility] [address], [place] (336) 350-1642, phone; (336) 294-5005, fax Sees patients 1st and 3rd Saturday of every month.  Must not qualify for public or private insurance (i.e. Medicaid, Medicare, [place] Health Choice, Veterans' Benefits) • Household income should be no more than 200% of the poverty level •The clinic cannot treat you if you are pregnant or think you are pregnant • Sexually transmitted diseases are not treated at the clinic.  ° ° °Dental Care: °Organization         Address  Phone  Notes  °[place] [facility] [address],  (336) 641-6152 Accepts children up to age 21 who are enrolled in Medicaid or [place] Health Choice; pregnant women with a Medicaid card; and children who have applied for Medicaid or [place] Health Choice, but were declined, whose parents can pay a reduced fee at time of service.  °[place]  [facility] [place]  [address], [place] (336) 641-7733 Accepts children up to age 21 who are enrolled in Medicaid or [place] Health Choice; pregnant women with a Medicaid card; and children who have applied for Medicaid or  Health Choice, but were declined, whose parents can pay a reduced fee at time of service.  °[facility] ° [address],  (336) 641-4533 Patients are seen by appointment only. Walk-ins are not accepted. [facility] will see patients 18 years of age and older. °Monday - Tuesday (8am-5pm) °Most Wednesdays (8:30-5pm) °$30 per visit, cash only  °[facility] ° [address], [place] (336) 641-4533 Patients are seen by appointment only. Walk-ins are not accepted. [facility] will see patients 18 years of age and older. °One   Wednesday Evening (Monthly: Volunteer Based).  $30 per visit, cash only  °[facility]  (919) 537-3737 for adults; Children under age 4, call Graduate Pediatric Dentistry at ([phone]) [phone]. Children aged 4-14, please call (919) [phone] to request a pediatric application. ° Dental services are provided in all areas of dental care including fillings, crowns and bridges, complete and partial dentures, implants, gum treatment, root canals, and extractions. Preventive care is also provided. Treatment is provided to both adults and children. °Patients are selected via a lottery and there is often a waiting list. °  °[facility] [address], °[place] ° (336) 763-8833 [email or web address] °  °[facility] [address], [place], [place] (336)723-1848, Ext. 123 Second and Fourth Thursday of each month, opens at 6:30 AM; Clinic ends at 9 AM.  Patients are seen on a first-come first-served basis, and a limited number are seen during each clinic.  ° °[facility] ° [address], [place], [place] (336) 723-7904    Eligibility Requirements °You must have lived in [place], [place], or [place] counties for at least the last three months. °  You cannot be eligible for state or federal sponsored healthcare insurance, including Veterans Administration, Medicaid, or Medicare. °  You generally cannot be eligible for healthcare insurance through your employer.  °  How to apply: °Eligibility screenings are held every Tuesday and Wednesday afternoon from 1:00 pm until 4:00 pm. You do not need an appointment for the interview!  °[facility] [address], [place], [place] 336-631-2330   °[facility]  336-342-8273   °[facility]  336-703-3100   °[facility]  336-570-6415   ° °Behavioral Health Resources in the Community: °Intensive Outpatient Programs °Organization         Address  Phone  Notes  °[facility] [address], [place], [place] 336-878-6098   °[facility] [address], [place], [place] 336-832-9800   °[facility] [address], [place], [place] ° 336-882-2125   °[facility] [address],  °[place], [place] 1-800-853-5163 or 336-641-4981   °Substance Abuse Resources °Organization         Address  Phone  Notes  °[facility]  336-882-2125   °[facility]  336-784-9470   °[facility]  336-285-9073   °[facility]  336-845-3988   °Residential & Outpatient Substance Abuse Program  1-800-659-3381   °Psychological Services °Organization         Address  Phone  Notes  °[facility]  336- 832-9600   °[facility]  336- 378-7881   °[facility] [address], [place] 1-800-853-5163 or 336-641-4981   ° °Mobile Crisis Teams °Organization         Address  Phone  Notes  °[facility], Mobile Crisis Care Unit  1-877-626-1772   °Assertive °[facility] ° [address]  [place], [place] 336-834-9664   °Sharon DeEsch [address] °[place] 336-554-5454   ° °Self-Help/Support Groups °Organization         Address  Phone             Notes  °[facility] [place] - variety of support groups  336- 373-1402 Call for more information  °Narcotics Anonymous (NA), [facility] [address], °[place]  2 meetings at this location  ° °  Residential Treatment Programs Organization         Address  Phone  Notes  [facility] [address],    [place]  1-(782) 090-8760   [facility]  [address], [place] [id number], [place], [place]   [place], [place] (319)186-7742 Admissions: 8am-3pm M-F  [facility] [place] [address]    [place], [place] [id number]   [facility] [address] [place], [place], [place]   [facility] [address],  [place], [place]   [facility] - Intensive Outpatient [address], [place], [place]   [facility] [place]  [place], [place] 1-(340)205-0590 or 323-465-8860   [facility] [address] [place], [place] Accepts Medicaid  [facility] [address],  [place] 1-712-416-5884 Substance Abuse/Addiction Treatment   [facility] Organization         Address  Phone  Notes  [facility]  804-015-1455   Domenic Schwab, PhD [address] [place], [place]   (604) 738-8913 or (337) 750-3280   [facility], [place] (608)226-1373   [facility] [address], [place], [place] 639-649-0327 Insurance/Medicaid/sponsorship through [facility] [address], [address] [place]                                    [place], [place] (812)884-6547 [place] [address], [place] 743-313-6898    Dr. Adele Schilder  [phone]   [facility] Dept. 1) [address], [place] 2) [place] 3)  [address], [place] 516-027-2302 708-072-1356  [phone]   [place] 5174488042 or 979-781-1556 (After Hours)      Take the prescription as directed.  Increase your fluid intake (ie:  Gatoraide) for the next few days.  Eat a bland diet and advance to your regular diet slowly as you can tolerate it. Call the Trauma Clinic tomorrow to confirm your previously scheduled follow up appointment for Wednesday (in 2 days).  Return to the Emergency Department immediately if not improving (or even worsening) despite taking the medicines as prescribed, any black or bloody stool or vomit, if you develop a fever over "101," or for any other concerns.
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
## 2015-08-29 ENCOUNTER — Other Ambulatory Visit (HOSPITAL_COMMUNITY): Payer: Self-pay | Admitting: Interventional Radiology

## 2015-08-29 ENCOUNTER — Other Ambulatory Visit: Payer: Self-pay | Admitting: General Surgery

## 2015-08-29 DIAGNOSIS — R188 Other ascites: Secondary | ICD-10-CM

## 2015-09-10 ENCOUNTER — Ambulatory Visit
Admission: RE | Admit: 2015-09-10 | Discharge: 2015-09-10 | Disposition: A | Discharge status: Home / Self Care | Payer: Medicaid Other | Source: Ambulatory Visit | Attending: General Surgery | Admitting: General Surgery

## 2015-09-10 ENCOUNTER — Ambulatory Visit
Admission: RE | Admit: 2015-09-10 | Discharge: 2015-09-10 | Disposition: A | Discharge status: Home / Self Care | Payer: Medicaid Other | Source: Ambulatory Visit | Attending: Interventional Radiology | Admitting: Interventional Radiology

## 2015-09-10 DIAGNOSIS — R188 Other ascites: Secondary | ICD-10-CM

## 2015-09-10 MED ORDER — IOPAMIDOL (ISOVUE-300) INJECTION 61%
100.0000 mL | Freq: Once | INTRAVENOUS | Status: AC | PRN
  Administered 2015-09-10: 100 mL via INTRAVENOUS
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
## 2015-09-11 ENCOUNTER — Telehealth (HOSPITAL_COMMUNITY): Payer: Self-pay | Admitting: Orthopedic Surgery

## 2015-09-16 NOTE — Telephone Encounter (Signed)
Told pt that she need to make another appt with Dr. Grandville Silos to discuss refilling pain medication.

## 2015-09-18 ENCOUNTER — Telehealth (HOSPITAL_COMMUNITY): Payer: Self-pay

## 2015-09-18 NOTE — Telephone Encounter (Signed)
Noted  

## 2015-09-23 ENCOUNTER — Other Ambulatory Visit: Payer: Medicaid Other | Admitting: Adult Health

## 2015-11-04 ENCOUNTER — Ambulatory Visit (INDEPENDENT_AMBULATORY_CARE_PROVIDER_SITE_OTHER): Payer: Medicaid Other | Admitting: Adult Health

## 2015-11-04 ENCOUNTER — Encounter: Payer: Self-pay | Admitting: Adult Health

## 2015-11-04 VITALS — BP 130/60 | HR 86 | Ht 66.0 in | Wt 155.0 lb

## 2015-11-04 DIAGNOSIS — Z3201 Encounter for pregnancy test, result positive: Secondary | ICD-10-CM

## 2015-11-04 DIAGNOSIS — Z3490 Encounter for supervision of normal pregnancy, unspecified, unspecified trimester: Secondary | ICD-10-CM

## 2015-11-04 DIAGNOSIS — O3680X0 Pregnancy with inconclusive fetal viability, not applicable or unspecified: Secondary | ICD-10-CM

## 2015-11-04 DIAGNOSIS — N926 Irregular menstruation, unspecified: Secondary | ICD-10-CM

## 2015-11-04 LAB — POCT URINE PREGNANCY: Preg Test, Ur: POSITIVE — AB

## 2015-11-04 MED ORDER — PRENATAL PLUS 27-1 MG PO TABS: 1.0000 | ORAL_TABLET | Freq: Every day | ORAL | Status: DC
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
## 2015-11-04 NOTE — Patient Instructions (Signed)
First Trimester of Pregnancy The first trimester of pregnancy is from week 1 until the end of week 12 (months 1 through 3). A week after a sperm fertilizes an egg, the egg will implant on the wall of the uterus. This embryo will begin to develop into a baby. Genes from you and your partner are forming the baby. The female genes determine whether the baby is a boy or a girl. At 6-8 weeks, the eyes and face are formed, and the heartbeat can be seen on ultrasound. At the end of 12 weeks, all the baby's organs are formed.  Now that you are pregnant, you will want to do everything you can to have a healthy baby. Two of the most important things are to get good prenatal care and to follow your health care provider's instructions. Prenatal care is all the medical care you receive before the baby's birth. This care will help prevent, find, and treat any problems during the pregnancy and childbirth. BODY CHANGES Your body goes through many changes during pregnancy. The changes vary from woman to woman.   You may gain or lose a couple of pounds at first.  You may feel sick to your stomach (nauseous) and throw up (vomit). If the vomiting is uncontrollable, call your health care provider.  You may tire easily.  You may develop headaches that can be relieved by medicines approved by your health care provider.  You may urinate more often. Painful urination may mean you have a bladder infection.  You may develop heartburn as a result of your pregnancy.  You may develop constipation because certain hormones are causing the muscles that push waste through your intestines to slow down.  You may develop hemorrhoids or swollen, bulging veins (varicose veins).  Your breasts may begin to grow larger and become tender. Your nipples may stick out more, and the tissue that surrounds them (areola) may become darker.  Your gums may bleed and may be sensitive to brushing and flossing.  Dark spots or blotches (chloasma,  mask of pregnancy) may develop on your face. This will likely fade after the baby is born.  Your menstrual periods will stop.  You may have a loss of appetite.  You may develop cravings for certain kinds of food.  You may have changes in your emotions from day to day, such as being excited to be pregnant or being concerned that something may go wrong with the pregnancy and baby.  You may have more vivid and strange dreams.  You may have changes in your hair. These can include thickening of your hair, rapid growth, and changes in texture. Some women also have hair loss during or after pregnancy, or hair that feels dry or thin. Your hair will most likely return to normal after your baby is born. WHAT TO EXPECT AT YOUR PRENATAL VISITS During a routine prenatal visit:  You will be weighed to make sure you and the baby are growing normally.  Your blood pressure will be taken.  Your abdomen will be measured to track your baby's growth.  The fetal heartbeat will be listened to starting around week 10 or 12 of your pregnancy.  Test results from any previous visits will be discussed. Your health care provider may ask you:  How you are feeling.  If you are feeling the baby move.  If you have had any abnormal symptoms, such as leaking fluid, bleeding, severe headaches, or abdominal cramping.  If you are using any tobacco products,   including cigarettes, chewing tobacco, and electronic cigarettes.  If you have any questions. Other tests that may be performed during your first trimester include:  Blood tests to find your blood type and to check for the presence of any previous infections. They will also be used to check for low iron levels (anemia) and Rh antibodies. Later in the pregnancy, blood tests for diabetes will be done along with other tests if problems develop.  Urine tests to check for infections, diabetes, or protein in the urine.  An ultrasound to confirm the proper growth  and development of the baby.  An amniocentesis to check for possible genetic problems.  Fetal screens for spina bifida and Down syndrome.  You may need other tests to make sure you and the baby are doing well.  HIV (human immunodeficiency virus) testing. Routine prenatal testing includes screening for HIV, unless you choose not to have this test. HOME CARE INSTRUCTIONS  Medicines  Follow your health care provider's instructions regarding medicine use. Specific medicines may be either safe or unsafe to take during pregnancy.  Take your prenatal vitamins as directed.  If you develop constipation, try taking a stool softener if your health care provider approves. Diet  Eat regular, well-balanced meals. Choose a variety of foods, such as meat or vegetable-based protein, fish, milk and low-fat dairy products, vegetables, fruits, and whole grain breads and cereals. Your health care provider will help you determine the amount of weight gain that is right for you.  Avoid raw meat and uncooked cheese. These carry germs that can cause birth defects in the baby.  Eating four or five small meals rather than three large meals a day may help relieve nausea and vomiting. If you start to feel nauseous, eating a few soda crackers can be helpful. Drinking liquids between meals instead of during meals also seems to help nausea and vomiting.  If you develop constipation, eat more high-fiber foods, such as fresh vegetables or fruit and whole grains. Drink enough fluids to keep your urine clear or pale yellow. Activity and Exercise  Exercise only as directed by your health care provider. Exercising will help you:  Control your weight.  Stay in shape.  Be prepared for labor and delivery.  Experiencing pain or cramping in the lower abdomen or low back is a good sign that you should stop exercising. Check with your health care provider before continuing normal exercises.  Try to avoid standing for long  periods of time. Move your legs often if you must stand in one place for a long time.  Avoid heavy lifting.  Wear low-heeled shoes, and practice good posture.  You may continue to have sex unless your health care provider directs you otherwise. Relief of Pain or Discomfort  Wear a good support bra for breast tenderness.   Take warm sitz baths to soothe any pain or discomfort caused by hemorrhoids. Use hemorrhoid cream if your health care provider approves.   Rest with your legs elevated if you have leg cramps or low back pain.  If you develop varicose veins in your legs, wear support hose. Elevate your feet for 15 minutes, 3-4 times a day. Limit salt in your diet. Prenatal Care  Schedule your prenatal visits by the twelfth week of pregnancy. They are usually scheduled monthly at first, then more often in the last 2 months before delivery.  Write down your questions. Take them to your prenatal visits.  Keep all your prenatal visits as directed by your   health care provider. Safety  Wear your seat belt at all times when driving.  Make a list of emergency phone numbers, including numbers for family, friends, the hospital, and police and fire departments. General Tips  Ask your health care provider for a referral to a local prenatal education class. Begin classes no later than at the beginning of month 6 of your pregnancy.  Ask for help if you have counseling or nutritional needs during pregnancy. Your health care provider can offer advice or refer you to specialists for help with various needs.  Do not use hot tubs, steam rooms, or saunas.  Do not douche or use tampons or scented sanitary pads.  Do not cross your legs for long periods of time.  Avoid cat litter boxes and soil used by cats. These carry germs that can cause birth defects in the baby and possibly loss of the fetus by miscarriage or stillbirth.  Avoid all smoking, herbs, alcohol, and medicines not prescribed by  your health care provider. Chemicals in these affect the formation and growth of the baby.  Do not use any tobacco products, including cigarettes, chewing tobacco, and electronic cigarettes. If you need help quitting, ask your health care provider. You may receive counseling support and other resources to help you quit.  Schedule a dentist appointment. At home, brush your teeth with a soft toothbrush and be gentle when you floss. SEEK MEDICAL CARE IF:   You have dizziness.  You have mild pelvic cramps, pelvic pressure, or nagging pain in the abdominal area.  You have persistent nausea, vomiting, or diarrhea.  You have a bad smelling vaginal discharge.  You have pain with urination.  You notice increased swelling in your face, hands, legs, or ankles. SEEK IMMEDIATE MEDICAL CARE IF:   You have a fever.  You are leaking fluid from your vagina.  You have spotting or bleeding from your vagina.  You have severe abdominal cramping or pain.  You have rapid weight gain or loss.  You vomit blood or material that looks like coffee grounds.  You are exposed to [place] measles and have never had them.  You are exposed to fifth disease or chickenpox.  You develop a severe headache.  You have shortness of breath.  You have any kind of trauma, such as from a fall or a car accident.   This information is not intended to replace advice given to you by your health care provider. Make sure you discuss any questions you have with your health care provider.   Document Released: 07/07/2001 Document Revised: 08/03/2014 Document Reviewed: 05/23/2013 Elsevier Interactive Patient Education 2016 [employer] [place] in 3 weeks  Will talk when labs back

## 2015-11-05 LAB — BETA HCG QUANT (REF LAB): HCG QUANT: 3375 m[IU]/mL

## 2015-11-05 LAB — PROGESTERONE: Progesterone: 14.4 ng/mL

## 2015-11-06 ENCOUNTER — Telehealth: Payer: Self-pay | Admitting: Adult Health

## 2015-11-06 NOTE — Telephone Encounter (Signed)
Left message to call about labs 

## 2015-11-06 NOTE — Telephone Encounter (Signed)
Pt aware of labs will move [place] to next week

## 2015-11-11 ENCOUNTER — Ambulatory Visit (INDEPENDENT_AMBULATORY_CARE_PROVIDER_SITE_OTHER): Payer: Medicaid Other

## 2015-11-11 DIAGNOSIS — O3680X0 Pregnancy with inconclusive fetal viability, not applicable or unspecified: Secondary | ICD-10-CM

## 2015-11-11 DIAGNOSIS — Z3A01 Less than 8 weeks gestation of pregnancy: Secondary | ICD-10-CM | POA: Diagnosis not present

## 2015-11-11 NOTE — Progress Notes (Signed)
[place] 6 wks,single IUP w/ys,pos fht 119 bpm,normal rt ov, unable to see lt ov,lt adnexa wnl,crl 4.4 mm

## 2015-11-19 ENCOUNTER — Encounter: Payer: Self-pay | Admitting: Women's Health

## 2015-11-19 ENCOUNTER — Ambulatory Visit (INDEPENDENT_AMBULATORY_CARE_PROVIDER_SITE_OTHER): Payer: Medicaid Other | Admitting: Women's Health

## 2015-11-19 VITALS — BP 124/62 | HR 84 | Wt 155.0 lb

## 2015-11-19 DIAGNOSIS — O09899 Supervision of other high risk pregnancies, unspecified trimester: Secondary | ICD-10-CM | POA: Insufficient documentation

## 2015-11-19 DIAGNOSIS — O09891 Supervision of other high risk pregnancies, first trimester: Secondary | ICD-10-CM

## 2015-11-19 DIAGNOSIS — Z369 Encounter for antenatal screening, unspecified: Secondary | ICD-10-CM

## 2015-11-19 DIAGNOSIS — Z3A08 8 weeks gestation of pregnancy: Secondary | ICD-10-CM

## 2015-11-19 DIAGNOSIS — Z3490 Encounter for supervision of normal pregnancy, unspecified, unspecified trimester: Secondary | ICD-10-CM | POA: Insufficient documentation

## 2015-11-19 DIAGNOSIS — Z3491 Encounter for supervision of normal pregnancy, unspecified, first trimester: Secondary | ICD-10-CM

## 2015-11-19 DIAGNOSIS — Z331 Pregnant state, incidental: Secondary | ICD-10-CM

## 2015-11-19 DIAGNOSIS — O09219 Supervision of pregnancy with history of pre-term labor, unspecified trimester: Secondary | ICD-10-CM

## 2015-11-19 DIAGNOSIS — O210 Mild hyperemesis gravidarum: Secondary | ICD-10-CM

## 2015-11-19 DIAGNOSIS — Z3682 Encounter for antenatal screening for nuchal translucency: Secondary | ICD-10-CM

## 2015-11-19 DIAGNOSIS — O09211 Supervision of pregnancy with history of pre-term labor, first trimester: Secondary | ICD-10-CM

## 2015-11-19 DIAGNOSIS — Z0283 Encounter for blood-alcohol and blood-drug test: Secondary | ICD-10-CM

## 2015-11-19 DIAGNOSIS — Z3481 Encounter for supervision of other normal pregnancy, first trimester: Secondary | ICD-10-CM | POA: Diagnosis not present

## 2015-11-19 DIAGNOSIS — Z1389 Encounter for screening for other disorder: Secondary | ICD-10-CM

## 2015-11-19 LAB — POCT URINALYSIS DIPSTICK
Glucose, UA: NEGATIVE
Ketones, UA: NEGATIVE
LEUKOCYTES UA: NEGATIVE
NITRITE UA: NEGATIVE
RBC UA: NEGATIVE

## 2015-11-19 MED ORDER — DOXYLAMINE-PYRIDOXINE 10-10 MG PO TBEC: DELAYED_RELEASE_TABLET | ORAL | Status: DC

## 2015-11-19 NOTE — Patient Instructions (Signed)
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
## 2015-11-20 LAB — URINALYSIS, ROUTINE W REFLEX MICROSCOPIC
Bilirubin, UA: NEGATIVE
GLUCOSE, UA: NEGATIVE
Nitrite, UA: NEGATIVE
RBC, UA: NEGATIVE
Urobilinogen, Ur: 1 mg/dL (ref 0.2–1.0)
pH, UA: 7.5 (ref 5.0–7.5)

## 2015-11-20 LAB — PMP SCREEN PROFILE (10S), URINE
Amphetamine Screen, Ur: NEGATIVE ng/mL
BARBITURATE SCRN UR: NEGATIVE ng/mL
Benzodiazepine Screen, Urine: NEGATIVE ng/mL
CREATININE(CRT), U: 400.1 mg/dL — AB (ref 20.0–300.0)
Cannabinoids Ur Ql Scn: NEGATIVE ng/mL
Cocaine(Metab.)Screen, Urine: NEGATIVE ng/mL
METHADONE SCREEN, URINE: NEGATIVE ng/mL
OPIATE SCRN UR: NEGATIVE ng/mL
OXYCODONE+OXYMORPHONE UR QL SCN: NEGATIVE ng/mL
PCP SCRN UR: NEGATIVE ng/mL
PROPOXYPHENE SCREEN: NEGATIVE ng/mL
Ph of Urine: 7.5 (ref 4.5–8.9)

## 2015-11-20 LAB — MICROSCOPIC EXAMINATION: Casts: NONE SEEN /lpf

## 2015-11-20 LAB — COMPREHENSIVE METABOLIC PANEL
A/G RATIO: 1.5 (ref 1.2–2.2)
ALT: 7 IU/L (ref 0–32)
AST: 14 IU/L (ref 0–40)
Albumin: 4.2 g/dL (ref 3.5–5.5)
Alkaline Phosphatase: 63 IU/L (ref 39–117)
BUN/Creatinine Ratio: 13 (ref 9–23)
BUN: 9 mg/dL (ref 6–20)
Bilirubin Total: 0.5 mg/dL (ref 0.0–1.2)
CALCIUM: 9.2 mg/dL (ref 8.7–10.2)
CO2: 20 mmol/L (ref 18–29)
CREATININE: 0.72 mg/dL (ref 0.57–1.00)
Chloride: 99 mmol/L (ref 96–106)
GFR, EST AFRICAN AMERICAN: 138 mL/min/{1.73_m2} (ref >59)
GFR, EST NON AFRICAN AMERICAN: 120 mL/min/{1.73_m2} (ref >59)
GLOBULIN, TOTAL: 2.8 g/dL (ref 1.5–4.5)
Glucose: 80 mg/dL (ref 65–99)
POTASSIUM: 4 mmol/L (ref 3.5–5.2)
SODIUM: 138 mmol/L (ref 134–144)
TOTAL PROTEIN: 7 g/dL (ref 6.0–8.5)

## 2015-11-20 LAB — CBC
HEMATOCRIT: 37.8 % (ref 34.0–46.6)
Hemoglobin: 12.4 g/dL (ref 11.1–15.9)
MCH: 24.8 pg — ABNORMAL LOW (ref 26.6–33.0)
MCHC: 32.8 g/dL (ref 31.5–35.7)
MCV: 76 fL — AB (ref 79–97)
Platelets: 307 10*3/uL (ref 150–379)
RBC: 4.99 x10E6/uL (ref 3.77–5.28)
RDW: 14.6 % (ref 12.3–15.4)
WBC: 8 10*3/uL (ref 3.4–10.8)

## 2015-11-20 LAB — VARICELLA ZOSTER ANTIBODY, IGG: Varicella zoster IgG: 1253 index (ref >165)

## 2015-11-20 LAB — RUBELLA SCREEN: RUBELLA: 1.8 {index} (ref >0.99)

## 2015-11-20 LAB — RPR: RPR: NONREACTIVE

## 2015-11-20 LAB — HEPATITIS B SURFACE ANTIGEN: Hepatitis B Surface Ag: NEGATIVE

## 2015-11-20 LAB — GC/CHLAMYDIA PROBE AMP
Chlamydia trachomatis, NAA: NEGATIVE
Neisseria gonorrhoeae by PCR: NEGATIVE

## 2015-11-20 LAB — ANTIBODY SCREEN: Antibody Screen: NEGATIVE

## 2015-11-20 LAB — HIV ANTIBODY (ROUTINE TESTING W REFLEX): HIV Screen 4th Generation wRfx: NONREACTIVE

## 2015-11-21 LAB — URINE CULTURE: ORGANISM ID, BACTERIA: NO GROWTH

## 2015-11-25 ENCOUNTER — Other Ambulatory Visit: Payer: Medicaid Other

## 2015-12-18 ENCOUNTER — Encounter: Payer: Medicaid Other | Admitting: Obstetrics and Gynecology

## 2015-12-18 ENCOUNTER — Other Ambulatory Visit: Payer: Medicaid Other

## 2015-12-27 ENCOUNTER — Other Ambulatory Visit: Payer: Medicaid Other

## 2015-12-27 ENCOUNTER — Encounter: Payer: Medicaid Other | Admitting: Obstetrics & Gynecology

## 2016-01-02 ENCOUNTER — Encounter (HOSPITAL_COMMUNITY): Payer: Self-pay

## 2016-01-02 ENCOUNTER — Emergency Department (HOSPITAL_COMMUNITY)
Admission: EM | Admit: 2016-01-02 | Discharge: 2016-01-03 | Disposition: A | Discharge status: Home / Self Care | Payer: Medicaid Other | Attending: Emergency Medicine | Admitting: Emergency Medicine

## 2016-01-02 DIAGNOSIS — R51 Headache: Secondary | ICD-10-CM | POA: Insufficient documentation

## 2016-01-02 DIAGNOSIS — R112 Nausea with vomiting, unspecified: Secondary | ICD-10-CM

## 2016-01-02 DIAGNOSIS — Z3A20 20 weeks gestation of pregnancy: Secondary | ICD-10-CM | POA: Insufficient documentation

## 2016-01-02 DIAGNOSIS — O210 Mild hyperemesis gravidarum: Secondary | ICD-10-CM | POA: Diagnosis present

## 2016-01-02 DIAGNOSIS — J029 Acute pharyngitis, unspecified: Secondary | ICD-10-CM | POA: Diagnosis not present

## 2016-01-02 DIAGNOSIS — Z3490 Encounter for supervision of normal pregnancy, unspecified, unspecified trimester: Secondary | ICD-10-CM

## 2016-01-02 LAB — CBC WITH DIFFERENTIAL/PLATELET
Basophils Absolute: 0 10*3/uL (ref 0.0–0.1)
Basophils Relative: 0 %
EOS PCT: 1 %
Eosinophils Absolute: 0 10*3/uL (ref 0.0–0.7)
HCT: 34.2 % — ABNORMAL LOW (ref 36.0–46.0)
HEMOGLOBIN: 11.6 g/dL — AB (ref 12.0–15.0)
LYMPHS ABS: 1 10*3/uL (ref 0.7–4.0)
LYMPHS PCT: 13 %
MCH: 26.5 pg (ref 26.0–34.0)
MCHC: 33.9 g/dL (ref 30.0–36.0)
MCV: 78.3 fL (ref 78.0–100.0)
Monocytes Absolute: 0.4 10*3/uL (ref 0.1–1.0)
Monocytes Relative: 6 %
NEUTROS PCT: 80 %
Neutro Abs: 5.8 10*3/uL (ref 1.7–7.7)
Platelets: 236 10*3/uL (ref 150–400)
RBC: 4.37 MIL/uL (ref 3.87–5.11)
RDW: 14.5 % (ref 11.5–15.5)
WBC: 7.2 10*3/uL (ref 4.0–10.5)

## 2016-01-02 LAB — BASIC METABOLIC PANEL
Anion gap: 6 (ref 5–15)
BUN: 6 mg/dL (ref 6–20)
CHLORIDE: 106 mmol/L (ref 101–111)
CO2: 22 mmol/L (ref 22–32)
CREATININE: 0.55 mg/dL (ref 0.44–1.00)
Calcium: 9.1 mg/dL (ref 8.9–10.3)
GFR calc Af Amer: >60 mL/min (ref >60)
GFR calc non Af Amer: >60 mL/min (ref >60)
Glucose, Bld: 87 mg/dL (ref 65–99)
Potassium: 3.7 mmol/L (ref 3.5–5.1)
Sodium: 134 mmol/L — ABNORMAL LOW (ref 135–145)

## 2016-01-02 MED ORDER — SODIUM CHLORIDE 0.9 % IV BOLUS (SEPSIS)
1000.0000 mL | Freq: Once | INTRAVENOUS | Status: AC
  Administered 2016-01-02: 1000 mL via INTRAVENOUS

## 2016-01-02 MED ORDER — SODIUM CHLORIDE 0.9 % IV SOLN: INTRAVENOUS | Status: DC

## 2016-01-02 MED ORDER — ONDANSETRON HCL 4 MG/2ML IJ SOLN
4.0000 mg | Freq: Once | INTRAMUSCULAR | Status: AC
  Administered 2016-01-02: 4 mg via INTRAVENOUS

## 2016-01-02 MED ORDER — ONDANSETRON HCL 4 MG/2ML IJ SOLN
4.0000 mg | Freq: Once | INTRAMUSCULAR | Status: AC
  Administered 2016-01-02: 4 mg via INTRAVENOUS
  Filled 2016-01-02: qty 2

## 2016-01-02 MED ORDER — ONDANSETRON HCL 4 MG/2ML IJ SOLN
INTRAMUSCULAR | Status: AC
  Filled 2016-01-02: qty 2

## 2016-01-02 NOTE — Discharge Instructions (Signed)
Call [facility] for follow-up and let them know you're having difficulty with the vomiting again. Hydrated with IV fluids here tonight. Return for any new or worse symptoms. Labs without significant abnormalities.

## 2016-01-02 NOTE — ED Notes (Signed)
Patient c/o general "sickness" states "sore throat, cold symptoms, and dehydration" patient states she is 3 months pregnant and has been vomiting the "whole pregnancy"
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
## 2016-01-09 ENCOUNTER — Encounter: Payer: Self-pay | Admitting: Obstetrics and Gynecology

## 2016-01-09 ENCOUNTER — Ambulatory Visit (INDEPENDENT_AMBULATORY_CARE_PROVIDER_SITE_OTHER): Payer: Medicaid Other | Admitting: Obstetrics and Gynecology

## 2016-01-09 VITALS — BP 100/64 | HR 77 | Wt 144.0 lb

## 2016-01-09 DIAGNOSIS — Z1389 Encounter for screening for other disorder: Secondary | ICD-10-CM

## 2016-01-09 DIAGNOSIS — Z3491 Encounter for supervision of normal pregnancy, unspecified, first trimester: Secondary | ICD-10-CM

## 2016-01-09 DIAGNOSIS — Z3A15 15 weeks gestation of pregnancy: Secondary | ICD-10-CM

## 2016-01-09 DIAGNOSIS — O09212 Supervision of pregnancy with history of pre-term labor, second trimester: Secondary | ICD-10-CM

## 2016-01-09 DIAGNOSIS — Z3482 Encounter for supervision of other normal pregnancy, second trimester: Secondary | ICD-10-CM

## 2016-01-09 DIAGNOSIS — IMO0001 Reserved for inherently not codable concepts without codable children: Secondary | ICD-10-CM

## 2016-01-09 DIAGNOSIS — Z331 Pregnant state, incidental: Secondary | ICD-10-CM

## 2016-01-09 DIAGNOSIS — O09891 Supervision of other high risk pregnancies, first trimester: Secondary | ICD-10-CM

## 2016-01-09 DIAGNOSIS — O09211 Supervision of pregnancy with history of pre-term labor, first trimester: Secondary | ICD-10-CM

## 2016-01-09 LAB — POCT URINALYSIS DIPSTICK
Blood, UA: NEGATIVE
GLUCOSE UA: NEGATIVE
KETONES UA: NEGATIVE
LEUKOCYTES UA: NEGATIVE
NITRITE UA: NEGATIVE

## 2016-01-09 NOTE — Progress Notes (Signed)
Pt denies any problems or concerns at this time.  

## 2016-01-09 NOTE — Progress Notes (Signed)
Patient ID: Ruth Dunlap, female   DOB: 07-Jun-1996, 20 y.o.   MRN: [id number]  [id number] [redacted]w[redacted]d Estimated Date of Delivery: 07/06/16 LROB  Patient reports too early for fetal movement, denies any bleeding and no rupture of membranes symptoms or regular contractions. Patient complaints: None.  Blood pressure 100/64, pulse 77, weight 144 lb (65.318 kg), last menstrual period 08/11/2015, unknown if currently breastfeeding.  refer to the ob flow sheet for FH and FHR, also BP, Wt, Urine results:notable for trace protein                       FHR: 137 bpm                                                     Questions were answered. Assessment: LROB [id number] @ [redacted]w[redacted]d   Plan:  Continued routine obstetrical care,   F/u in 5 weeks for pnx care, anatomy scan  By signing my name below, I, Stephania Fragmin, attest that this documentation has been prepared under the direction and in the presence of Jonnie Kind, MD. Electronically Signed: Stephania Fragmin, ED Scribe. 01/09/2016. 4:02 PM.     I personally performed the services described in this documentation, which was SCRIBED in my presence. The recorded information has been reviewed and considered accurate. It has been edited as necessary during review. Jonnie Kind, MD

## 2016-02-12 ENCOUNTER — Other Ambulatory Visit: Payer: Self-pay | Admitting: Obstetrics and Gynecology

## 2016-02-12 DIAGNOSIS — Z1389 Encounter for screening for other disorder: Secondary | ICD-10-CM

## 2016-02-13 ENCOUNTER — Ambulatory Visit (INDEPENDENT_AMBULATORY_CARE_PROVIDER_SITE_OTHER): Payer: Medicaid Other

## 2016-02-13 ENCOUNTER — Ambulatory Visit (INDEPENDENT_AMBULATORY_CARE_PROVIDER_SITE_OTHER): Payer: Medicaid Other | Admitting: Advanced Practice Midwife

## 2016-02-13 ENCOUNTER — Encounter: Payer: Self-pay | Admitting: Advanced Practice Midwife

## 2016-02-13 DIAGNOSIS — Z36 Encounter for antenatal screening of mother: Secondary | ICD-10-CM

## 2016-02-13 DIAGNOSIS — Z3492 Encounter for supervision of normal pregnancy, unspecified, second trimester: Secondary | ICD-10-CM

## 2016-02-13 DIAGNOSIS — Z3A20 20 weeks gestation of pregnancy: Secondary | ICD-10-CM | POA: Diagnosis not present

## 2016-02-13 DIAGNOSIS — Z1389 Encounter for screening for other disorder: Secondary | ICD-10-CM

## 2016-02-13 DIAGNOSIS — Z3482 Encounter for supervision of other normal pregnancy, second trimester: Secondary | ICD-10-CM

## 2016-02-13 MED ORDER — OMEPRAZOLE 20 MG PO CPDR: 20.0000 mg | DELAYED_RELEASE_CAPSULE | Freq: Every day | ORAL | Status: DC

## 2016-02-13 MED ORDER — GLYCOPYRROLATE 1 MG PO TABS: 1.0000 mg | ORAL_TABLET | Freq: Three times a day (TID) | ORAL | Status: DC

## 2016-02-13 NOTE — Progress Notes (Signed)
[place] [id number] wks,cephalic,ant pl gr 0,normal ov's bilat,fhr 149 bpm, cx 3.7 cm,svp of fluid 4.3 cm,efw 293g,anatomy complete,no obvious abnormalities seen

## 2016-02-13 NOTE — Patient Instructions (Signed)

## 2016-02-13 NOTE — Progress Notes (Signed)
[id number] [redacted]w[redacted]d Estimated Date of Delivery: 07/06/16  Blood pressure 100/60, pulse 80, weight 151 lb (68.493 kg), last menstrual period 08/11/2015, unknown if currently breastfeeding.   BP weight and urine results all reviewed and noted.  Please refer to the obstetrical flow sheet for the fundal height and fetal heart rate documentation:  Patient reports good fetal movement, denies any bleeding and no rupture of membranes symptoms or regular contractions. Patient is without complaints. Has ptylism an heartburn.  . Rx robinul an prilosec  All questions were answered.  Orders Placed This Encounter  Procedures  . POCT urinalysis dipstick    Plan:  Continued routine obstetrical care,  Progress Notes    Expand All Collapse All   [place] [id number] wks,cephalic,ant pl gr 0,normal ov's bilat,fhr 149 bpm, cx 3.7 cm,svp of fluid 4.3 cm,efw 293g,anatomy complete,no obvious abnormalities seen           Return in about 4 weeks (around 03/12/2016) for LROB.

## 2016-02-13 NOTE — Addendum Note (Signed)
Addended by: Diona Fanti A on: 02/13/2016 03:47 PM   Modules accepted: Orders

## 2016-03-12 ENCOUNTER — Ambulatory Visit (INDEPENDENT_AMBULATORY_CARE_PROVIDER_SITE_OTHER): Payer: Medicaid Other | Admitting: Advanced Practice Midwife

## 2016-03-12 VITALS — BP 110/54 | HR 98 | Wt 160.0 lb

## 2016-03-12 DIAGNOSIS — Z1389 Encounter for screening for other disorder: Secondary | ICD-10-CM

## 2016-03-12 DIAGNOSIS — Z3492 Encounter for supervision of normal pregnancy, unspecified, second trimester: Secondary | ICD-10-CM

## 2016-03-12 DIAGNOSIS — Z331 Pregnant state, incidental: Secondary | ICD-10-CM

## 2016-03-12 LAB — POCT URINALYSIS DIPSTICK
Glucose, UA: NEGATIVE
KETONES UA: NEGATIVE
LEUKOCYTES UA: NEGATIVE
Nitrite, UA: NEGATIVE
Protein, UA: NEGATIVE

## 2016-03-12 NOTE — Patient Instructions (Signed)

## 2016-03-12 NOTE — Progress Notes (Signed)
[id number] [redacted]w[redacted]d Estimated Date of Delivery: 07/06/16  Blood pressure (!) 110/54, pulse 98, weight 160 lb (72.6 kg), last menstrual period 08/11/2015, unknown if currently breastfeeding.   BP weight and urine results all reviewed and noted.  Please refer to the obstetrical flow sheet for the fundal height and fetal heart rate documentation:  Patient reports good fetal movement, denies any bleeding and no rupture of membranes symptoms or regular contractions. Patient is without complaints. All questions were answered.  Orders Placed This Encounter  Procedures  . POCT urinalysis dipstick    Plan:  Continued routine obstetrical care,  Return in about 4 weeks (around 04/09/2016) for PN2/LROB.

## 2016-03-31 ENCOUNTER — Emergency Department (HOSPITAL_COMMUNITY)
Admission: EM | Admit: 2016-03-31 | Discharge: 2016-03-31 | Disposition: A | Discharge status: Home / Self Care | Payer: Medicaid Other | Attending: Emergency Medicine | Admitting: Emergency Medicine

## 2016-03-31 ENCOUNTER — Encounter (HOSPITAL_COMMUNITY): Payer: Self-pay | Admitting: Emergency Medicine

## 2016-03-31 DIAGNOSIS — R109 Unspecified abdominal pain: Secondary | ICD-10-CM | POA: Diagnosis not present

## 2016-03-31 DIAGNOSIS — Z79899 Other long term (current) drug therapy: Secondary | ICD-10-CM | POA: Diagnosis not present

## 2016-03-31 DIAGNOSIS — O219 Vomiting of pregnancy, unspecified: Secondary | ICD-10-CM | POA: Diagnosis present

## 2016-03-31 DIAGNOSIS — R112 Nausea with vomiting, unspecified: Secondary | ICD-10-CM

## 2016-03-31 DIAGNOSIS — Z3A23 23 weeks gestation of pregnancy: Secondary | ICD-10-CM | POA: Insufficient documentation

## 2016-03-31 DIAGNOSIS — R110 Nausea: Secondary | ICD-10-CM

## 2016-03-31 LAB — COMPREHENSIVE METABOLIC PANEL
ALBUMIN: 3 g/dL — AB (ref 3.5–5.0)
ALT: 8 U/L — ABNORMAL LOW (ref 14–54)
ANION GAP: 8 (ref 5–15)
AST: 16 U/L (ref 15–41)
Alkaline Phosphatase: 56 U/L (ref 38–126)
BILIRUBIN TOTAL: 0.3 mg/dL (ref 0.3–1.2)
BUN: 10 mg/dL (ref 6–20)
CHLORIDE: 103 mmol/L (ref 101–111)
CO2: 22 mmol/L (ref 22–32)
Calcium: 8.6 mg/dL — ABNORMAL LOW (ref 8.9–10.3)
Creatinine, Ser: 0.53 mg/dL (ref 0.44–1.00)
GFR calc Af Amer: >60 mL/min (ref >60)
GFR calc non Af Amer: >60 mL/min (ref >60)
GLUCOSE: 98 mg/dL (ref 65–99)
POTASSIUM: 3.3 mmol/L — AB (ref 3.5–5.1)
SODIUM: 133 mmol/L — AB (ref 135–145)
TOTAL PROTEIN: 6.6 g/dL (ref 6.5–8.1)

## 2016-03-31 LAB — URINALYSIS, ROUTINE W REFLEX MICROSCOPIC
BILIRUBIN URINE: NEGATIVE
GLUCOSE, UA: NEGATIVE mg/dL
Hgb urine dipstick: NEGATIVE
KETONES UR: NEGATIVE mg/dL
NITRITE: NEGATIVE
PH: 6 (ref 5.0–8.0)
PROTEIN: NEGATIVE mg/dL
SPECIFIC GRAVITY, URINE: 1.01 (ref 1.005–1.030)

## 2016-03-31 LAB — CBC
HEMATOCRIT: 29.2 % — AB (ref 36.0–46.0)
HEMOGLOBIN: 9.6 g/dL — AB (ref 12.0–15.0)
MCH: 27.4 pg (ref 26.0–34.0)
MCHC: 32.9 g/dL (ref 30.0–36.0)
MCV: 83.4 fL (ref 78.0–100.0)
Platelets: 231 10*3/uL (ref 150–400)
RBC: 3.5 MIL/uL — ABNORMAL LOW (ref 3.87–5.11)
RDW: 13.3 % (ref 11.5–15.5)
WBC: 11.3 10*3/uL — ABNORMAL HIGH (ref 4.0–10.5)

## 2016-03-31 LAB — URINE MICROSCOPIC-ADD ON

## 2016-03-31 LAB — LIPASE, BLOOD: LIPASE: 23 U/L (ref 11–51)

## 2016-03-31 MED ORDER — DICYCLOMINE HCL 20 MG PO TABS: 20.0000 mg | ORAL_TABLET | Freq: Two times a day (BID) | ORAL | 0 refills | Status: DC

## 2016-03-31 MED ORDER — ONDANSETRON 4 MG PO TBDP: 4.0000 mg | ORAL_TABLET | Freq: Three times a day (TID) | ORAL | 0 refills | Status: DC | PRN

## 2016-03-31 MED ORDER — SODIUM CHLORIDE 0.9 % IV SOLN
Freq: Once | INTRAVENOUS | Status: AC
  Administered 2016-03-31: 14:00:00 via INTRAVENOUS

## 2016-03-31 MED ORDER — ONDANSETRON HCL 4 MG/2ML IJ SOLN
INTRAMUSCULAR | Status: AC
  Filled 2016-03-31: qty 2

## 2016-03-31 MED ORDER — ONDANSETRON HCL 4 MG/2ML IJ SOLN
4.0000 mg | Freq: Once | INTRAMUSCULAR | Status: AC
  Administered 2016-03-31: 4 mg via INTRAVENOUS

## 2016-03-31 MED ORDER — SODIUM CHLORIDE 0.9 % IV SOLN
Freq: Once | INTRAVENOUS | Status: AC
  Administered 2016-03-31: 13:00:00 via INTRAVENOUS

## 2016-03-31 NOTE — ED Notes (Signed)
RRN at [facility] states no contractions and baby well.Will notify EDP
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
## 2016-03-31 NOTE — ED Notes (Signed)
RRN at [facility] notified.

## 2016-03-31 NOTE — ED Notes (Signed)
See [facility] record for MRN [id number] for this section of tracing.

## 2016-03-31 NOTE — ED Notes (Signed)
Advised RRN at [facility] pt is being d/c.

## 2016-03-31 NOTE — ED Triage Notes (Signed)
Pt c/o generalized abdominal pain and n/v that began yesterday. Pt states she is 5 months pregnant. EDD 07/06/2016. She is a patient of Dr. Glo Herring. Denies vaginal bleeding or discharge.

## 2016-03-31 NOTE — Progress Notes (Signed)
FHR for this patient's baby for 1224-1232 can be found in [place] record for [id number].

## 2016-04-09 ENCOUNTER — Ambulatory Visit (INDEPENDENT_AMBULATORY_CARE_PROVIDER_SITE_OTHER): Payer: Medicaid Other | Admitting: Advanced Practice Midwife

## 2016-04-09 ENCOUNTER — Other Ambulatory Visit: Payer: Medicaid Other

## 2016-04-09 VITALS — BP 112/54 | HR 90 | Wt 166.0 lb

## 2016-04-09 DIAGNOSIS — Z331 Pregnant state, incidental: Secondary | ICD-10-CM

## 2016-04-09 DIAGNOSIS — Z3492 Encounter for supervision of normal pregnancy, unspecified, second trimester: Secondary | ICD-10-CM

## 2016-04-09 DIAGNOSIS — Z131 Encounter for screening for diabetes mellitus: Secondary | ICD-10-CM

## 2016-04-09 DIAGNOSIS — Z1389 Encounter for screening for other disorder: Secondary | ICD-10-CM

## 2016-04-09 DIAGNOSIS — Z369 Encounter for antenatal screening, unspecified: Secondary | ICD-10-CM

## 2016-04-09 LAB — POCT URINALYSIS DIPSTICK
Glucose, UA: NEGATIVE
Ketones, UA: NEGATIVE
LEUKOCYTES UA: NEGATIVE
NITRITE UA: NEGATIVE
RBC UA: NEGATIVE

## 2016-04-09 NOTE — Progress Notes (Signed)
[id number] [redacted]w[redacted]d Estimated Date of Delivery: 07/06/16  Blood pressure (!) 112/54, pulse 90, weight 166 lb (75.3 kg), last menstrual period 08/11/2015, unknown if currently breastfeeding.   BP weight and urine results all reviewed and noted.  Please refer to the obstetrical flow sheet for the fundal height and fetal heart rate documentation:  Patient reports good fetal movement, denies any bleeding and no rupture of membranes symptoms or regular contractions. Patient is without complaints. All questions were answered.  Orders Placed This Encounter  Procedures  . POCT urinalysis dipstick    Plan:  Continued routine obstetrical care, PN2 today  Return in about 3 weeks (around 04/30/2016) for LROB.

## 2016-04-10 LAB — HIV ANTIBODY (ROUTINE TESTING W REFLEX): HIV SCREEN 4TH GENERATION: NONREACTIVE

## 2016-04-10 LAB — CBC
Hematocrit: 29.9 % — ABNORMAL LOW (ref 34.0–46.6)
Hemoglobin: 9.9 g/dL — ABNORMAL LOW (ref 11.1–15.9)
MCH: 26.5 pg — ABNORMAL LOW (ref 26.6–33.0)
MCHC: 33.1 g/dL (ref 31.5–35.7)
MCV: 80 fL (ref 79–97)
PLATELETS: 280 10*3/uL (ref 150–379)
RBC: 3.74 x10E6/uL — AB (ref 3.77–5.28)
RDW: 13 % (ref 12.3–15.4)
WBC: 8.9 10*3/uL (ref 3.4–10.8)

## 2016-04-10 LAB — GLUCOSE TOLERANCE, 2 HOURS W/ 1HR
GLUCOSE, 1 HOUR: 140 mg/dL (ref 65–179)
GLUCOSE, FASTING: 91 mg/dL (ref 65–91)
Glucose, 2 hour: 99 mg/dL (ref 65–152)

## 2016-04-10 LAB — RPR: RPR Ser Ql: NONREACTIVE

## 2016-04-10 LAB — ANTIBODY SCREEN: Antibody Screen: NEGATIVE

## 2016-04-30 ENCOUNTER — Ambulatory Visit (INDEPENDENT_AMBULATORY_CARE_PROVIDER_SITE_OTHER): Payer: Medicaid Other | Admitting: Women's Health

## 2016-04-30 VITALS — BP 120/70 | HR 98 | Wt 168.2 lb

## 2016-04-30 DIAGNOSIS — Z3483 Encounter for supervision of other normal pregnancy, third trimester: Secondary | ICD-10-CM

## 2016-04-30 DIAGNOSIS — Z1389 Encounter for screening for other disorder: Secondary | ICD-10-CM

## 2016-04-30 DIAGNOSIS — O09899 Supervision of other high risk pregnancies, unspecified trimester: Secondary | ICD-10-CM

## 2016-04-30 DIAGNOSIS — Z331 Pregnant state, incidental: Secondary | ICD-10-CM

## 2016-04-30 DIAGNOSIS — O09219 Supervision of pregnancy with history of pre-term labor, unspecified trimester: Secondary | ICD-10-CM

## 2016-04-30 LAB — POCT URINALYSIS DIPSTICK
Blood, UA: NEGATIVE
GLUCOSE UA: NEGATIVE
Ketones, UA: NEGATIVE
Leukocytes, UA: NEGATIVE
NITRITE UA: NEGATIVE

## 2016-04-30 MED ORDER — FERROUS SULFATE 325 (65 FE) MG PO TABS: 325.0000 mg | ORAL_TABLET | Freq: Two times a day (BID) | ORAL | 3 refills | Status: DC

## 2016-04-30 NOTE — Patient Instructions (Signed)
Call the office [phone]) or go to [facility] if:  You begin to have strong, frequent contractions  Your water breaks.  Sometimes it is a big gush of fluid, sometimes it is just a trickle that keeps getting your panties wet or running down your legs  You have vaginal bleeding.  It is normal to have a small amount of spotting if your cervix was checked.   You don't feel your baby moving like normal.  If you don't, get you something to eat and drink and lay down and focus on feeling your baby move.  You should feel at least 10 movements in 2 hours.  If you don't, you should call the office or go to [facility].    Tdap Vaccine  It is recommended that you get the Tdap vaccine during the third trimester of EACH pregnancy to help protect your baby from getting pertussis (whooping cough)  27-36 weeks is the BEST time to do this so that you can pass the protection on to your baby. During pregnancy is better than after pregnancy, but if you are unable to get it during pregnancy it will be offered at the hospital.   You can get this vaccine at the health department or your family doctor Everyone who will be around your baby should also be up-to-date on their vaccines. Adults (who are not pregnant) only need 1 dose of Tdap during adulthood.   Preterm Labor Information Preterm labor is when labor starts at less than 37 weeks of pregnancy. The normal length of a pregnancy is 39 to 41 weeks. CAUSES Often, there is no identifiable underlying cause as to why a woman goes into preterm labor. One of the most common known causes of preterm labor is infection. Infections of the uterus, cervix, vagina, amniotic sac, bladder, kidney, or even the lungs (pneumonia) can cause labor to start. Other suspected causes of preterm labor include:   Urogenital infections, such as yeast infections and bacterial vaginosis.   Uterine abnormalities (uterine shape, uterine septum, fibroids, or bleeding from the  placenta).   A cervix that has been operated on (it may fail to stay closed).   Malformations in the fetus.   Multiple gestations (twins, triplets, and so on).   Breakage of the amniotic sac.  RISK FACTORS  Having a previous history of preterm labor.   Having premature rupture of membranes (PROM).   Having a placenta that covers the opening of the cervix (placenta previa).   Having a placenta that separates from the uterus (placental abruption).   Having a cervix that is too weak to hold the fetus in the uterus (incompetent cervix).   Having too much fluid in the amniotic sac (polyhydramnios).   Taking illegal drugs or smoking while pregnant.   Not gaining enough weight while pregnant.   Being younger than 1 and older than 22 years old.   Having a low socioeconomic status.   Being African American. SYMPTOMS Signs and symptoms of preterm labor include:   Menstrual-like cramps, abdominal pain, or back pain.  Uterine contractions that are regular, as frequent as six in an hour, regardless of their intensity (may be mild or painful).  Contractions that start on the top of the uterus and spread down to the lower abdomen and back.   A sense of increased pelvic pressure.   A watery or bloody mucus discharge that comes from the vagina.  TREATMENT Depending on the length of the pregnancy and other circumstances, your health  care provider may suggest bed rest. If necessary, there are medicines that can be given to stop contractions and to mature the fetal lungs. If labor happens before 34 weeks of pregnancy, a prolonged hospital stay may be recommended. Treatment depends on the condition of both you and the fetus.  WHAT SHOULD YOU DO IF YOU THINK YOU ARE IN PRETERM LABOR? Call your health care provider right away. You will need to go to the hospital to get checked immediately. HOW CAN YOU PREVENT PRETERM LABOR IN FUTURE PREGNANCIES? You should:   Stop  smoking if you smoke.  Maintain healthy weight gain and avoid chemicals and drugs that are not necessary.  Be watchful for any type of infection.  Inform your health care provider if you have a known history of preterm labor.   This information is not intended to replace advice given to you by your health care provider. Make sure you discuss any questions you have with your health care provider.   Document Released: 10/03/2003 Document Revised: 03/15/2013 Document Reviewed: 08/15/2012 Elsevier Interactive Patient Education 2016 Elsevier Inc.  Iron-Rich Diet Iron is a mineral that helps your body to produce hemoglobin. Hemoglobin is a protein in your red blood cells that carries oxygen to your body's tissues. Eating too little iron may cause you to feel weak and tired, and it can increase your risk for infection. Eating enough iron is necessary for your body's metabolism, muscle function, and nervous system. Iron is naturally found in many foods. It can also be added to foods or fortified in foods. There are two types of dietary iron:  Heme iron. Heme iron is absorbed by the body more easily than nonheme iron. Heme iron is found in meat, poultry, and fish.  Nonheme iron. Nonheme iron is found in dietary supplements, iron-fortified grains, beans, and vegetables. You may need to follow an iron-rich diet if:  You have been diagnosed with iron deficiency or iron-deficiency anemia.  You have a condition that prevents you from absorbing dietary iron, such as:  Infection in your intestines.  Celiac disease. This involves long-lasting (chronic) inflammation of your intestines.  You do not eat enough iron.  You eat a diet that is high in foods that impair iron absorption.  You have lost a lot of blood.  You have heavy bleeding during your menstrual cycle.  You are pregnant. WHAT IS MY PLAN? Your health care provider may help you to determine how much iron you need per day based on  your condition. Generally, when a person consumes sufficient amounts of iron in the diet, the following iron needs are met:  Men.  48-105 years old: 11 mg per day.  33-59 years old: 8 mg per day.  Women.   60-33 years old: 15 mg per day.  15-10 years old: 18 mg per day.  Over 71 years old: 8 mg per day.  Pregnant women: 27 mg per day.  Breastfeeding women: 9 mg per day. WHAT DO I NEED TO KNOW ABOUT AN IRON-RICH DIET?  Eat fresh fruits and vegetables that are high in vitamin C along with foods that are high in iron. This will help increase the amount of iron that your body absorbs from food, especially with foods containing nonheme iron. Foods that are high in vitamin C include oranges, peppers, tomatoes, and mango.  Take iron supplements only as directed by your health care provider. Overdose of iron can be life-threatening. If you were prescribed iron supplements, take them with  orange juice or a vitamin C supplement.  Cook foods in pots and pans that are made from iron.   Eat nonheme iron-containing foods alongside foods that are high in heme iron. This helps to improve your iron absorption.   Certain foods and drinks contain compounds that impair iron absorption. Avoid eating these foods in the same meal as iron-rich foods or with iron supplements. These include:  Coffee, black tea, and red wine.  Milk, dairy products, and foods that are high in calcium.  Beans, soybeans, and peas.  Whole grains.  When eating foods that contain both nonheme iron and compounds that impair iron absorption, follow these tips to absorb iron better.   Soak beans overnight before cooking.  Soak whole grains overnight and drain them before using.  Ferment flours before baking, such as using yeast in bread dough. WHAT FOODS CAN I EAT? Grains Iron-fortified breakfast cereal. Iron-fortified whole-wheat bread. Enriched rice. Sprouted grains. Vegetables Spinach. Potatoes with skin. Green  peas. Broccoli. Red and green bell peppers. Fermented vegetables. Fruits Prunes. Raisins. Oranges. Strawberries. Mango. Grapefruit. Meats and Other Protein Sources Beef liver. Oysters. Beef. Shrimp. [place]. Chicken. Golden. Sardines. Chickpeas. Nuts. Tofu. Beverages Tomato juice. Fresh orange juice. Prune juice. Hibiscus tea. Fortified instant breakfast shakes. Condiments Tahini. Fermented soy sauce. Sweets and Desserts Black-strap molasses.  Other Wheat germ. The items listed above may not be a complete list of recommended foods or beverages. Contact your dietitian for more options. WHAT FOODS ARE NOT RECOMMENDED? Grains Whole grains. Bran cereal. Bran flour. Oats. Vegetables Artichokes. Brussels sprouts. Kale. Fruits Blueberries. Raspberries. Strawberries. Figs. Meats and Other Protein Sources Soybeans. Products made from soy protein. Dairy Milk. Cream. Cheese. Yogurt. Cottage cheese. Beverages Coffee. Black tea. Red wine. Sweets and Desserts Cocoa. Chocolate. Ice cream. Other Basil. Oregano. Parsley. The items listed above may not be a complete list of foods and beverages to avoid. Contact your dietitian for more information.   This information is not intended to replace advice given to you by your health care provider. Make sure you discuss any questions you have with your health care provider.   Document Released: 02/24/2005 Document Revised: 08/03/2014 Document Reviewed: 02/07/2014 Elsevier Interactive Patient Education [employer].

## 2016-04-30 NOTE — Progress Notes (Signed)
Low-risk OB appointment [id number] [redacted]w[redacted]d Estimated Date of Delivery: 07/06/16 BP 120/70   Pulse 98   Wt 168 lb 3.2 oz (76.3 kg)   LMP 08/11/2015   BMI 27.15 kg/m   BP, weight, and urine reviewed.  Refer to obstetrical flow sheet for FH & FHR.  Reports good fm.  Denies regular uc's, lof, vb, or uti s/s. Lt RLP.  Reviewed ptl s/s, fkc. Recommended Tdap at HD/PCP per CDC guidelines.  Plan:  Continue routine obstetrical care  F/U in 2wks for OB appointment  Declined flu shot Rx Fe for Hgb 9.9 at PN2, increase fe-rich foods

## 2016-05-05 ENCOUNTER — Encounter: Payer: Self-pay | Admitting: Obstetrics & Gynecology

## 2016-05-05 ENCOUNTER — Ambulatory Visit (INDEPENDENT_AMBULATORY_CARE_PROVIDER_SITE_OTHER): Payer: Medicaid Other | Admitting: Obstetrics & Gynecology

## 2016-05-05 VITALS — BP 100/60 | HR 88 | Wt 170.0 lb

## 2016-05-05 DIAGNOSIS — O26899 Other specified pregnancy related conditions, unspecified trimester: Secondary | ICD-10-CM

## 2016-05-05 DIAGNOSIS — Z3A31 31 weeks gestation of pregnancy: Secondary | ICD-10-CM

## 2016-05-05 DIAGNOSIS — Z1389 Encounter for screening for other disorder: Secondary | ICD-10-CM

## 2016-05-05 DIAGNOSIS — Z331 Pregnant state, incidental: Secondary | ICD-10-CM

## 2016-05-05 DIAGNOSIS — O09219 Supervision of pregnancy with history of pre-term labor, unspecified trimester: Secondary | ICD-10-CM

## 2016-05-05 DIAGNOSIS — R109 Unspecified abdominal pain: Secondary | ICD-10-CM

## 2016-05-05 LAB — POCT URINALYSIS DIPSTICK
Glucose, UA: NEGATIVE
Ketones, UA: NEGATIVE
Nitrite, UA: NEGATIVE
RBC UA: NEGATIVE
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
## 2016-05-14 ENCOUNTER — Encounter: Payer: Self-pay | Admitting: Women's Health

## 2016-05-14 ENCOUNTER — Ambulatory Visit (INDEPENDENT_AMBULATORY_CARE_PROVIDER_SITE_OTHER): Payer: Medicaid Other | Admitting: Women's Health

## 2016-05-14 VITALS — BP 100/50 | HR 90 | Wt 170.5 lb

## 2016-05-14 DIAGNOSIS — Z1389 Encounter for screening for other disorder: Secondary | ICD-10-CM

## 2016-05-14 DIAGNOSIS — Z3483 Encounter for supervision of other normal pregnancy, third trimester: Secondary | ICD-10-CM

## 2016-05-14 DIAGNOSIS — R82998 Other abnormal findings in urine: Secondary | ICD-10-CM

## 2016-05-14 DIAGNOSIS — Z331 Pregnant state, incidental: Secondary | ICD-10-CM

## 2016-05-14 LAB — POCT URINALYSIS DIPSTICK
Glucose, UA: NEGATIVE
Ketones, UA: NEGATIVE
NITRITE UA: NEGATIVE

## 2016-05-14 MED ORDER — PRENATAL PLUS 27-1 MG PO TABS: 1.0000 | ORAL_TABLET | Freq: Every day | ORAL | 12 refills | Status: DC

## 2016-05-14 NOTE — Patient Instructions (Addendum)
Call the office [phone]) or go to [facility] if:  You begin to have strong, frequent contractions  Your water breaks.  Sometimes it is a big gush of fluid, sometimes it is just a trickle that keeps getting your panties wet or running down your legs  You have vaginal bleeding.  It is normal to have a small amount of spotting if your cervix was checked.   You don't feel your baby moving like normal.  If you don't, get you something to eat and drink and lay down and focus on feeling your baby move.  You should feel at least 10 movements in 2 hours.  If you don't, you should call the office or go to [place] Preterm labor is when labor starts at less than 37 weeks of pregnancy. The normal length of a pregnancy is 39 to 41 weeks. CAUSES Often, there is no identifiable underlying cause as to why a woman goes into preterm labor. One of the most common known causes of preterm labor is infection. Infections of the uterus, cervix, vagina, amniotic sac, bladder, kidney, or even the lungs (pneumonia) can cause labor to start. Other suspected causes of preterm labor include:   Urogenital infections, such as yeast infections and bacterial vaginosis.   Uterine abnormalities (uterine shape, uterine septum, fibroids, or bleeding from the placenta).   A cervix that has been operated on (it may fail to stay closed).   Malformations in the fetus.   Multiple gestations (twins, triplets, and so on).   Breakage of the amniotic sac.  RISK FACTORS  Having a previous history of preterm labor.   Having premature rupture of membranes (PROM).   Having a placenta that covers the opening of the cervix (placenta previa).   Having a placenta that separates from the uterus (placental abruption).   Having a cervix that is too weak to hold the fetus in the uterus (incompetent cervix).   Having too much fluid in the amniotic sac (polyhydramnios).   Taking  illegal drugs or smoking while pregnant.   Not gaining enough weight while pregnant.   Being younger than 60 and older than 22 years old.   Having a low socioeconomic status.   Being African American. SYMPTOMS Signs and symptoms of preterm labor include:   Menstrual-like cramps, abdominal pain, or back pain.  Uterine contractions that are regular, as frequent as six in an hour, regardless of their intensity (may be mild or painful).  Contractions that start on the top of the uterus and spread down to the lower abdomen and back.   A sense of increased pelvic pressure.   A watery or bloody mucus discharge that comes from the vagina.  TREATMENT Depending on the length of the pregnancy and other circumstances, your health care provider may suggest bed rest. If necessary, there are medicines that can be given to stop contractions and to mature the fetal lungs. If labor happens before 34 weeks of pregnancy, a prolonged hospital stay may be recommended. Treatment depends on the condition of both you and the fetus.  WHAT SHOULD YOU DO IF YOU THINK YOU ARE IN PRETERM LABOR? Call your health care provider right away. You will need to go to the hospital to get checked immediately. HOW CAN YOU PREVENT PRETERM LABOR IN FUTURE PREGNANCIES? You should:   Stop smoking if you smoke.  Maintain healthy weight gain and avoid chemicals and drugs that are not necessary.  Be watchful for  any type of infection.  Inform your health care provider if you have a known history of preterm labor.   This information is not intended to replace advice given to you by your health care provider. Make sure you discuss any questions you have with your health care provider.   Document Released: 10/03/2003 Document Revised: 03/15/2013 Document Reviewed: 08/15/2012 Elsevier Interactive Patient Education 2016 Elsevier Inc.  Iron-Rich Diet Iron is a mineral that helps your body to produce hemoglobin.  Hemoglobin is a protein in your red blood cells that carries oxygen to your body's tissues. Eating too little iron may cause you to feel weak and tired, and it can increase your risk for infection. Eating enough iron is necessary for your body's metabolism, muscle function, and nervous system. Iron is naturally found in many foods. It can also be added to foods or fortified in foods. There are two types of dietary iron:  Heme iron. Heme iron is absorbed by the body more easily than nonheme iron. Heme iron is found in meat, poultry, and fish.  Nonheme iron. Nonheme iron is found in dietary supplements, iron-fortified grains, beans, and vegetables. You may need to follow an iron-rich diet if:  You have been diagnosed with iron deficiency or iron-deficiency anemia.  You have a condition that prevents you from absorbing dietary iron, such as:  Infection in your intestines.  Celiac disease. This involves long-lasting (chronic) inflammation of your intestines.  You do not eat enough iron.  You eat a diet that is high in foods that impair iron absorption.  You have lost a lot of blood.  You have heavy bleeding during your menstrual cycle.  You are pregnant. WHAT IS MY PLAN? Your health care provider may help you to determine how much iron you need per day based on your condition. Generally, when a person consumes sufficient amounts of iron in the diet, the following iron needs are met:  Men.  34-36 years old: 11 mg per day.  53-17 years old: 8 mg per day.  Women.   36-18 years old: 15 mg per day.  66-44 years old: 18 mg per day.  Over 71 years old: 8 mg per day.  Pregnant women: 27 mg per day.  Breastfeeding women: 9 mg per day. WHAT DO I NEED TO KNOW ABOUT AN IRON-RICH DIET?  Eat fresh fruits and vegetables that are high in vitamin C along with foods that are high in iron. This will help increase the amount of iron that your body absorbs from food, especially with foods  containing nonheme iron. Foods that are high in vitamin C include oranges, peppers, tomatoes, and mango.  Take iron supplements only as directed by your health care provider. Overdose of iron can be life-threatening. If you were prescribed iron supplements, take them with orange juice or a vitamin C supplement.  Cook foods in pots and pans that are made from iron.   Eat nonheme iron-containing foods alongside foods that are high in heme iron. This helps to improve your iron absorption.   Certain foods and drinks contain compounds that impair iron absorption. Avoid eating these foods in the same meal as iron-rich foods or with iron supplements. These include:  Coffee, black tea, and red wine.  Milk, dairy products, and foods that are high in calcium.  Beans, soybeans, and peas.  Whole grains.  When eating foods that contain both nonheme iron and compounds that impair iron absorption, follow these tips to absorb iron better.  Soak beans overnight before cooking.  Soak whole grains overnight and drain them before using.  Ferment flours before baking, such as using yeast in bread dough. WHAT FOODS CAN I EAT? Grains Iron-fortified breakfast cereal. Iron-fortified whole-wheat bread. Enriched rice. Sprouted grains. Vegetables Spinach. Potatoes with skin. Green peas. Broccoli. Red and green bell peppers. Fermented vegetables. Fruits Prunes. Raisins. Oranges. Strawberries. Mango. Grapefruit. Meats and Other Protein Sources Beef liver. Oysters. Beef. Shrimp. [place]. Chicken. [place]. Sardines. Chickpeas. Nuts. Tofu. Beverages Tomato juice. Fresh orange juice. Prune juice. Hibiscus tea. Fortified instant breakfast shakes. Condiments Tahini. Fermented soy sauce. Sweets and Desserts Black-strap molasses.  Other Wheat germ. The items listed above may not be a complete list of recommended foods or beverages. Contact your dietitian for more options. WHAT FOODS ARE NOT  RECOMMENDED? Grains Whole grains. Bran cereal. Bran flour. Oats. Vegetables Artichokes. Brussels sprouts. Kale. Fruits Blueberries. Raspberries. Strawberries. Figs. Meats and Other Protein Sources Soybeans. Products made from soy protein. Dairy Milk. Cream. Cheese. Yogurt. Cottage cheese. Beverages Coffee. Black tea. Red wine. Sweets and Desserts Cocoa. Chocolate. Ice cream. Other Basil. Oregano. Parsley. The items listed above may not be a complete list of foods and beverages to avoid. Contact your dietitian for more information.   This information is not intended to replace advice given to you by your health care provider. Make sure you discuss any questions you have with your health care provider.   Document Released: 02/24/2005 Document Revised: 08/03/2014 Document Reviewed: 02/07/2014 Elsevier Interactive Patient Education [employer].

## 2016-05-14 NOTE — Progress Notes (Signed)
Low-risk OB appointment [id number] [redacted]w[redacted]d Estimated Date of Delivery: 07/06/16 BP (!) 100/50   Pulse 90   Wt 170 lb 8 oz (77.3 kg)   LMP 08/11/2015   BMI 27.52 kg/m   BP, weight, and urine reviewed.  Refer to obstetrical flow sheet for FH & FHR.  Reports good fm.  Denies regular uc's, lof, vb, or uti s/s. No complaints. Reviewed ptl s/s, fkc. Recommended Tdap at HD/PCP per CDC guidelines.  Plan:  Continue routine obstetrical care  F/U in 2wks for OB appointment

## 2016-05-16 LAB — URINE CULTURE: ORGANISM ID, BACTERIA: NO GROWTH

## 2016-05-19 ENCOUNTER — Inpatient Hospital Stay (HOSPITAL_COMMUNITY)
Admission: AD | Admit: 2016-05-19 | Discharge: 2016-05-21 | DRG: 778 | Disposition: A | Discharge status: Home / Self Care | Payer: Medicaid Other | Source: Ambulatory Visit | Attending: Obstetrics & Gynecology | Admitting: Obstetrics & Gynecology

## 2016-05-19 ENCOUNTER — Telehealth: Payer: Self-pay | Admitting: *Deleted

## 2016-05-19 ENCOUNTER — Encounter (HOSPITAL_COMMUNITY): Payer: Self-pay | Admitting: *Deleted

## 2016-05-19 DIAGNOSIS — Z833 Family history of diabetes mellitus: Secondary | ICD-10-CM | POA: Diagnosis not present

## 2016-05-19 DIAGNOSIS — Z3A33 33 weeks gestation of pregnancy: Secondary | ICD-10-CM

## 2016-05-19 DIAGNOSIS — Z3A25 25 weeks gestation of pregnancy: Secondary | ICD-10-CM

## 2016-05-19 DIAGNOSIS — R109 Unspecified abdominal pain: Secondary | ICD-10-CM | POA: Diagnosis present

## 2016-05-19 DIAGNOSIS — Z8249 Family history of ischemic heart disease and other diseases of the circulatory system: Secondary | ICD-10-CM | POA: Diagnosis not present

## 2016-05-19 LAB — URINALYSIS, ROUTINE W REFLEX MICROSCOPIC
BILIRUBIN URINE: NEGATIVE
Glucose, UA: NEGATIVE mg/dL
Hgb urine dipstick: NEGATIVE
Ketones, ur: 15 mg/dL — AB
NITRITE: NEGATIVE
PH: 6 (ref 5.0–8.0)
Protein, ur: NEGATIVE mg/dL
SPECIFIC GRAVITY, URINE: 1.02 (ref 1.005–1.030)

## 2016-05-19 LAB — OB RESULTS CONSOLE GBS: STREP GROUP B AG: NEGATIVE

## 2016-05-19 LAB — URINE MICROSCOPIC-ADD ON: Bacteria, UA: NONE SEEN

## 2016-05-19 LAB — CBC
HEMATOCRIT: 26.4 % — AB (ref 36.0–46.0)
HEMOGLOBIN: 8.8 g/dL — AB (ref 12.0–15.0)
MCH: 26.6 pg (ref 26.0–34.0)
MCHC: 33.3 g/dL (ref 30.0–36.0)
MCV: 79.8 fL (ref 78.0–100.0)
Platelets: 224 10*3/uL (ref 150–400)
RBC: 3.31 MIL/uL — ABNORMAL LOW (ref 3.87–5.11)
RDW: 13.6 % (ref 11.5–15.5)
WBC: 10.9 10*3/uL — ABNORMAL HIGH (ref 4.0–10.5)

## 2016-05-19 LAB — GROUP B STREP BY PCR: Group B strep by PCR: NEGATIVE

## 2016-05-19 LAB — WET PREP, GENITAL
Clue Cells Wet Prep HPF POC: NONE SEEN
SPERM: NONE SEEN
TRICH WET PREP: NONE SEEN
YEAST WET PREP: NONE SEEN

## 2016-05-19 LAB — FETAL FIBRONECTIN: Fetal Fibronectin: POSITIVE — AB

## 2016-05-19 LAB — TYPE AND SCREEN
ABO/RH(D): AB POS
ANTIBODY SCREEN: NEGATIVE

## 2016-05-19 MED ORDER — LACTATED RINGERS IV BOLUS (SEPSIS)
1000.0000 mL | Freq: Once | INTRAVENOUS | Status: AC
  Administered 2016-05-19: 1000 mL via INTRAVENOUS

## 2016-05-19 MED ORDER — ACETAMINOPHEN 325 MG PO TABS: 650.0000 mg | ORAL_TABLET | ORAL | Status: DC | PRN

## 2016-05-19 MED ORDER — ZOLPIDEM TARTRATE 5 MG PO TABS
5.0000 mg | ORAL_TABLET | Freq: Every evening | ORAL | Status: DC | PRN
  Administered 2016-05-19 – 2016-05-20 (×2): 5 mg via ORAL
  Filled 2016-05-19 (×2): qty 1

## 2016-05-19 MED ORDER — PANTOPRAZOLE SODIUM 40 MG PO TBEC
40.0000 mg | DELAYED_RELEASE_TABLET | Freq: Every day | ORAL | Status: DC
  Administered 2016-05-20 – 2016-05-21 (×2): 40 mg via ORAL
  Filled 2016-05-19: qty 1

## 2016-05-19 MED ORDER — ALUM & MAG HYDROXIDE-SIMETH 200-200-20 MG/5ML PO SUSP
30.0000 mL | Freq: Four times a day (QID) | ORAL | Status: DC | PRN
  Administered 2016-05-19: 30 mL via ORAL
  Filled 2016-05-19 (×2): qty 30

## 2016-05-19 MED ORDER — NIFEDIPINE 10 MG PO CAPS
10.0000 mg | ORAL_CAPSULE | Freq: Once | ORAL | Status: AC
  Administered 2016-05-19: 10 mg via ORAL
  Filled 2016-05-19 (×2): qty 1

## 2016-05-19 MED ORDER — BETAMETHASONE SOD PHOS & ACET 6 (3-3) MG/ML IJ SUSP: 12.0000 mg | Freq: Once | INTRAMUSCULAR | Status: DC

## 2016-05-19 MED ORDER — BETAMETHASONE SOD PHOS & ACET 6 (3-3) MG/ML IJ SUSP
12.0000 mg | INTRAMUSCULAR | Status: AC
  Administered 2016-05-19 – 2016-05-20 (×2): 12 mg via INTRAMUSCULAR
  Filled 2016-05-19 (×2): qty 2

## 2016-05-19 MED ORDER — CALCIUM CARBONATE ANTACID 500 MG PO CHEW
2.0000 | CHEWABLE_TABLET | ORAL | Status: DC | PRN
  Administered 2016-05-20: 400 mg via ORAL
  Filled 2016-05-19: qty 2

## 2016-05-19 MED ORDER — DOCUSATE SODIUM 100 MG PO CAPS
100.0000 mg | ORAL_CAPSULE | Freq: Every day | ORAL | Status: DC
  Administered 2016-05-20 – 2016-05-21 (×2): 100 mg via ORAL
  Filled 2016-05-19 (×2): qty 1

## 2016-05-19 MED ORDER — LACTATED RINGERS IV SOLN
INTRAVENOUS | Status: DC
  Administered 2016-05-19 – 2016-05-20 (×2): via INTRAVENOUS

## 2016-05-19 MED ORDER — NIFEDIPINE 10 MG PO CAPS
20.0000 mg | ORAL_CAPSULE | Freq: Once | ORAL | Status: AC
  Administered 2016-05-19: 20 mg via ORAL
  Filled 2016-05-19: qty 2

## 2016-05-19 MED ORDER — PRENATAL MULTIVITAMIN CH
1.0000 | ORAL_TABLET | Freq: Every day | ORAL | Status: DC
  Administered 2016-05-20 – 2016-05-21 (×2): 1 via ORAL
  Filled 2016-05-19 (×2): qty 1

## 2016-05-19 NOTE — MAU Note (Signed)
Pt having lower abd pain since 1400 today, also lower back pain.  Urinary frequency.  Denies bleeding or LOF.
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
## 2016-05-19 NOTE — Telephone Encounter (Signed)
Patients mother called stating patient has been cramping and leaking fluid. States it is a constant drip. Spoke with Cyndie Chime, CNM who states patient needs to go to [facility]. Information relayed to mother.

## 2016-05-20 LAB — GC/CHLAMYDIA PROBE AMP (~~LOC~~) NOT AT ARMC
Chlamydia: NEGATIVE
NEISSERIA GONORRHEA: NEGATIVE
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
## 2016-05-21 LAB — CULTURE, OB URINE: Culture: NO GROWTH

## 2016-05-21 LAB — HIV ANTIBODY (ROUTINE TESTING W REFLEX): HIV Screen 4th Generation wRfx: NONREACTIVE

## 2016-05-21 LAB — RPR: RPR Ser Ql: NONREACTIVE

## 2016-05-21 NOTE — Discharge Summary (Signed)
Antenatal Physician Discharge Summary  Patient ID: Ruth Dunlap MRN: [id number] DOB/AGE: May 11, 1996 20 y.o.  Admit date: 05/19/2016 Discharge date: 05/21/2016  Admission Diagnoses:  Preterm labor  ANTENATAL DISCHARGE SUMMARY  Discharge Diagnoses:  Preterm labor  Prenatal Procedures: NST and Betamethasone course and Tocolytic  Intrapartum Procedures: Neonatology, Maternal Fetal Medicine None  Significant Diagnostic Studies:  Results for orders placed or performed during the hospital encounter of 05/19/16 (from the past 168 hour(s))  GC/Chlamydia probe amp ([place])not at [facility]   Collection Time: 05/19/16 12:00 AM  Result Value Ref Range   Chlamydia Negative    Neisseria gonorrhea Negative   Culture, OB Urine   Collection Time: 05/19/16  5:43 PM  Result Value Ref Range   Specimen Description OB CLEAN CATCH    Special Requests NONE    Culture NO GROWTH Performed at [facility]     Report Status 05/21/2016 FINAL   Urinalysis, Routine w reflex microscopic (not at [facility])   Collection Time: 05/19/16  5:43 PM  Result Value Ref Range   Color, Urine YELLOW YELLOW   APPearance CLEAR CLEAR   Specific Gravity, Urine 1.020 1.005 - 1.030   pH 6.0 5.0 - 8.0   Glucose, UA NEGATIVE NEGATIVE mg/dL   Hgb urine dipstick NEGATIVE NEGATIVE   Bilirubin Urine NEGATIVE NEGATIVE   Ketones, ur 15 (A) NEGATIVE mg/dL   Protein, ur NEGATIVE NEGATIVE mg/dL   Nitrite NEGATIVE NEGATIVE   Leukocytes, UA TRACE (A) NEGATIVE  Urine microscopic-add on   Collection Time: 05/19/16  5:43 PM  Result Value Ref Range   Squamous Epithelial / LPF 0-5 (A) NONE SEEN   WBC, UA 0-5 0 - 5 WBC/hpf   RBC / HPF 0-5 0 - 5 RBC/hpf   Bacteria, UA NONE SEEN NONE SEEN   Urine-Other MUCOUS PRESENT   Fetal fibronectin   Collection Time: 05/19/16  7:00 PM  Result Value Ref Range   Fetal Fibronectin POSITIVE (A) NEGATIVE  Wet prep, genital   Collection Time: 05/19/16  8:15 PM  Result Value Ref Range   Yeast Wet Prep HPF POC NONE SEEN NONE SEEN   Trich, Wet Prep NONE SEEN NONE SEEN   Clue Cells Wet Prep HPF POC NONE SEEN NONE SEEN   WBC, Wet Prep HPF POC MANY (A) NONE SEEN   Sperm NONE SEEN   Group B strep by PCR   Collection Time: 05/19/16  8:15 PM  Result Value Ref Range   Group B strep by PCR NEGATIVE NEGATIVE  RPR   Collection Time: 05/19/16  8:47 PM  Result Value Ref Range   RPR Ser Ql Non Reactive Non Reactive  CBC   Collection Time: 05/19/16  8:47 PM  Result Value Ref Range   WBC 10.9 (H) 4.0 - 10.5 K/uL   RBC 3.31 (L) 3.87 - 5.11 MIL/uL   Hemoglobin 8.8 (L) 12.0 - 15.0 g/dL   HCT 26.4 (L) 36.0 - 46.0 %   MCV 79.8 78.0 - 100.0 fL   MCH 26.6 26.0 - 34.0 pg   MCHC 33.3 30.0 - 36.0 g/dL   RDW 13.6 11.5 - 15.5 %   Platelets 224 150 - 400 K/uL  HIV antibody (routine testing) (NOT for [facility])   Collection Time: 05/19/16  8:47 PM  Result Value Ref Range   HIV Screen 4th Generation wRfx Non Reactive Non Reactive  Type and screen [place]   Collection Time: 05/19/16  8:47 PM  Result Value Ref Range  ABO/RH(D) AB POS    Antibody Screen NEG    Sample Expiration 05/22/2016     Treatments: steroids: betamethasone;  Procardia (tocolytic)  Hospital Course:  This is a 20 y.o. [id number] with IUP at [redacted]w[redacted]d admitted for preterm labor. She was admitted with contractions, noted to have a cervical exam change of 1.5 cm to 2.5cm/40%/-3.  No leaking of fluid and no bleeding. +FFN. She was initially started with Procardia 20mg  for tocolysis and also received betamethasone x 2 doses.  She was observed, fetal heart rate monitoring remained reassuring, and she had no signs/symptoms of progressing preterm labor or other maternal-fetal concerns.  Her cervical exam was unchanged from admission.  She was deemed stable for discharge to home with outpatient follow up.  Discharge Exam: BP (!) 110/54 (BP Location: Left Arm)   Pulse 91   Temp 98.7 F (37.1 C) (Oral)   Resp 16    Ht 5\' 6"  (1.676 m)   Wt 170 lb (77.1 kg)   LMP 08/11/2015   BMI 27.44 kg/m  General appearance: alert, cooperative and appears stated age Head: Normocephalic, without obvious abnormality, atraumatic Eyes: conjunctivae/corneas clear. PERRL, EOM's intact. Fundi benign. Resp: clear to auscultation bilaterally Cardio: regular rate and rhythm, S1, S2 normal, no murmur, click, rub or gallop GI: soft, non-tender; bowel sounds normal; no masses,  no organomegaly Extremities: extremities normal, atraumatic, no cyanosis or edema and Homans sign is negative, no sign of DVT Skin: Skin color, texture, turgor normal. No rashes or lesions Neurologic: Grossly normal  Discharge Condition: stable  Disposition: 01-Home or Self Care     Medication List    TAKE these medications   ferrous sulfate 325 (65 FE) MG tablet Take 1 tablet (325 mg total) by mouth 2 (two) times daily with a meal.   prenatal vitamin w/FE, FA 27-1 MG Tabs tablet Take 1 tablet by mouth daily at 12 noon.      Follow-up Information    [facility]. Schedule an appointment as soon as possible for a visit in 1 week(s).   Specialty:  Obstetrics and Gynecology Why:  Routine OB care, follow up preterm labor Contact information: [place]          Signed: Katherine Basset, DO OB Fellow 05/21/2016, 10:53 PM

## 2016-05-21 NOTE — Discharge Instructions (Signed)

## 2016-05-21 NOTE — Progress Notes (Signed)
Preterm labor discussed.

## 2016-05-26 ENCOUNTER — Telehealth: Payer: Self-pay | Admitting: *Deleted

## 2016-05-26 NOTE — Telephone Encounter (Signed)
Ruth Dunlap from the [facility] Dept states that pt Hgb today was 9.6, pt is taking a daily PNV.

## 2016-05-26 NOTE — Telephone Encounter (Signed)
Pt informed of Hgb level of 9.6, take Fe bid and increase iron rich foods (green leafy veg, beans, and red meat). Pt states she has not picked up her Rx for Fe yet. Pt encouraged to get Rx for Fe as soon as possible due to low Hgb. Pt verbalized understanding.

## 2016-05-28 ENCOUNTER — Encounter (HOSPITAL_COMMUNITY): Payer: Self-pay | Admitting: *Deleted

## 2016-05-28 ENCOUNTER — Encounter: Payer: Self-pay | Admitting: Women's Health

## 2016-05-28 ENCOUNTER — Ambulatory Visit (INDEPENDENT_AMBULATORY_CARE_PROVIDER_SITE_OTHER): Payer: Medicaid Other | Admitting: Women's Health

## 2016-05-28 ENCOUNTER — Inpatient Hospital Stay (HOSPITAL_COMMUNITY)
Admission: AD | Admit: 2016-05-28 | Discharge: 2016-05-29 | Disposition: A | Discharge status: Home / Self Care | Payer: Medicaid Other | Source: Ambulatory Visit | Attending: Obstetrics & Gynecology | Admitting: Obstetrics & Gynecology

## 2016-05-28 VITALS — BP 110/50 | HR 96 | Wt 174.0 lb

## 2016-05-28 DIAGNOSIS — O26893 Other specified pregnancy related conditions, third trimester: Secondary | ICD-10-CM

## 2016-05-28 DIAGNOSIS — N898 Other specified noninflammatory disorders of vagina: Secondary | ICD-10-CM | POA: Diagnosis not present

## 2016-05-28 DIAGNOSIS — Z1389 Encounter for screening for other disorder: Secondary | ICD-10-CM

## 2016-05-28 DIAGNOSIS — Z331 Pregnant state, incidental: Secondary | ICD-10-CM

## 2016-05-28 DIAGNOSIS — Z3A35 35 weeks gestation of pregnancy: Secondary | ICD-10-CM

## 2016-05-28 DIAGNOSIS — Z3A34 34 weeks gestation of pregnancy: Secondary | ICD-10-CM | POA: Insufficient documentation

## 2016-05-28 DIAGNOSIS — Z3493 Encounter for supervision of normal pregnancy, unspecified, third trimester: Secondary | ICD-10-CM

## 2016-05-28 DIAGNOSIS — Z3483 Encounter for supervision of other normal pregnancy, third trimester: Secondary | ICD-10-CM

## 2016-05-28 LAB — POCT WET PREP (WET MOUNT)
Clue Cells Wet Prep Whiff POC: POSITIVE
Trichomonas Wet Prep HPF POC: ABSENT

## 2016-05-28 LAB — POCT URINALYSIS DIPSTICK
Glucose, UA: NEGATIVE
KETONES UA: NEGATIVE
LEUKOCYTES UA: NEGATIVE
Nitrite, UA: NEGATIVE

## 2016-05-28 MED ORDER — METRONIDAZOLE 500 MG PO TABS: 500.0000 mg | ORAL_TABLET | Freq: Two times a day (BID) | ORAL | 0 refills | Status: DC

## 2016-05-28 NOTE — MAU Note (Signed)
PT  SAYS SHE WAS HERE ON 10-24-  HAD POSITIVE FFN   .  VE  2 CM.      TODAY  AT [facility]   - VE  WAS 4  CM.      SAYS UC STRONG  AT 3 PM.      LAST SEX-  OCT.

## 2016-05-28 NOTE — Patient Instructions (Signed)
Call the office ([phone]) or go to [facility] if:  You begin to have strong, frequent contractions  Your water breaks.  Sometimes it is a big gush of fluid, sometimes it is just a trickle that keeps getting your panties wet or running down your legs  You have vaginal bleeding.  It is normal to have a small amount of spotting if your cervix was checked.   You don't feel your baby moving like normal.  If you don't, get you something to eat and drink and lay down and focus on feeling your baby move.  You should feel at least 10 movements in 2 hours.  If you don't, you should call the office or go to [facility].    Preterm Labor Information Preterm labor is when labor starts at less than 37 weeks of pregnancy. The normal length of a pregnancy is 39 to 41 weeks. CAUSES Often, there is no identifiable underlying cause as to why a woman goes into preterm labor. One of the most common known causes of preterm labor is infection. Infections of the uterus, cervix, vagina, amniotic sac, bladder, kidney, or even the lungs (pneumonia) can cause labor to start. Other suspected causes of preterm labor include:   Urogenital infections, such as yeast infections and bacterial vaginosis.   Uterine abnormalities (uterine shape, uterine septum, fibroids, or bleeding from the placenta).   A cervix that has been operated on (it may fail to stay closed).   Malformations in the fetus.   Multiple gestations (twins, triplets, and so on).   Breakage of the amniotic sac.  RISK FACTORS  Having a previous history of preterm labor.   Having premature rupture of membranes (PROM).   Having a placenta that covers the opening of the cervix (placenta previa).   Having a placenta that separates from the uterus (placental abruption).   Having a cervix that is too weak to hold the fetus in the uterus (incompetent cervix).   Having too much fluid in the amniotic sac (polyhydramnios).   Taking  illegal drugs or smoking while pregnant.   Not gaining enough weight while pregnant.   Being younger than 18 and older than 22 years old.   Having a low socioeconomic status.   Being African American. SYMPTOMS Signs and symptoms of preterm labor include:   Menstrual-like cramps, abdominal pain, or back pain.  Uterine contractions that are regular, as frequent as six in an hour, regardless of their intensity (may be mild or painful).  Contractions that start on the top of the uterus and spread down to the lower abdomen and back.   A sense of increased pelvic pressure.   A watery or bloody mucus discharge that comes from the vagina.  TREATMENT Depending on the length of the pregnancy and other circumstances, your health care provider may suggest bed rest. If necessary, there are medicines that can be given to stop contractions and to mature the fetal lungs. If labor happens before 34 weeks of pregnancy, a prolonged hospital stay may be recommended. Treatment depends on the condition of both you and the fetus.  WHAT SHOULD YOU DO IF YOU THINK YOU ARE IN PRETERM LABOR? Call your health care provider right away. You will need to go to the hospital to get checked immediately. HOW CAN YOU PREVENT PRETERM LABOR IN FUTURE PREGNANCIES? You should:   Stop smoking if you smoke.  Maintain healthy weight gain and avoid chemicals and drugs that are not necessary.  Be watchful for   any type of infection.  Inform your health care provider if you have a known history of preterm labor.   This information is not intended to replace advice given to you by your health care provider. Make sure you discuss any questions you have with your health care provider.   Document Released: 10/03/2003 Document Revised: 03/15/2013 Document Reviewed: 08/15/2012 Elsevier Interactive Patient Education 2016 [employer] Inc[employer]  

## 2016-05-28 NOTE — Progress Notes (Signed)
Low-risk OB appointment [id number] [redacted]w[redacted]d Estimated Date of Delivery: 07/06/16 BP (!) 110/50   Pulse 96   Wt 174 lb (78.9 kg)   LMP 08/11/2015   BMI 28.08 kg/m   BP, weight, and urine reviewed.  Refer to obstetrical flow sheet for FH & FHR.  Reports good fm.  Denies lof, vb, or uti s/s. Admitted to [facility] 10/24-10/26 d/t preterm uc's w/ cervical change, received bmz x 2, and d/c'd w/ cx unchanged at 2.5cm/40.  Still having some uc's/back pain, doesn't feel like she's in labor. Some yellow d/c x 2d, no itching/irritation/odor.  Spec exam: mod amt thin white malodorous d/c, wet prep many clues, Rx metronidazole 500mg  BID x 7d for BV, no sex while taking  SVE per request: 4/50/-2, vtx Reviewed ptl s/s, fkc. Plan:  Continue routine obstetrical care  F/U in 2wks for OB appointment

## 2016-05-28 NOTE — MAU Note (Signed)
Pt states she has been having lower abd pain, lower back pain, and pressure in her rectum.

## 2016-05-29 DIAGNOSIS — O4703 False labor before 37 completed weeks of gestation, third trimester: Secondary | ICD-10-CM

## 2016-05-29 DIAGNOSIS — Z3A34 34 weeks gestation of pregnancy: Secondary | ICD-10-CM

## 2016-05-29 LAB — URINE MICROSCOPIC-ADD ON

## 2016-05-29 LAB — URINALYSIS, ROUTINE W REFLEX MICROSCOPIC
Bilirubin Urine: NEGATIVE
GLUCOSE, UA: NEGATIVE mg/dL
Ketones, ur: NEGATIVE mg/dL
Nitrite: NEGATIVE
PH: 6 (ref 5.0–8.0)
Protein, ur: NEGATIVE mg/dL
SPECIFIC GRAVITY, URINE: 1.025 (ref 1.005–1.030)

## 2016-05-29 MED ORDER — NIFEDIPINE 10 MG PO CAPS: 10.0000 mg | ORAL_CAPSULE | ORAL | 1 refills | Status: DC | PRN

## 2016-05-29 MED ORDER — NIFEDIPINE 10 MG PO CAPS
10.0000 mg | ORAL_CAPSULE | ORAL | Status: DC | PRN
  Administered 2016-05-29: 10 mg via ORAL
  Filled 2016-05-29: qty 1

## 2016-05-29 NOTE — Discharge Instructions (Signed)
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
## 2016-06-04 IMAGING — DX DG CHEST 2V
2 series · 2 of 2 positions shown · non-contrast
Comparison: None.

CLINICAL DATA: 18-year-old female with a history of chest pain

EXAM:
CHEST - 2 VIEW

[chest pa]
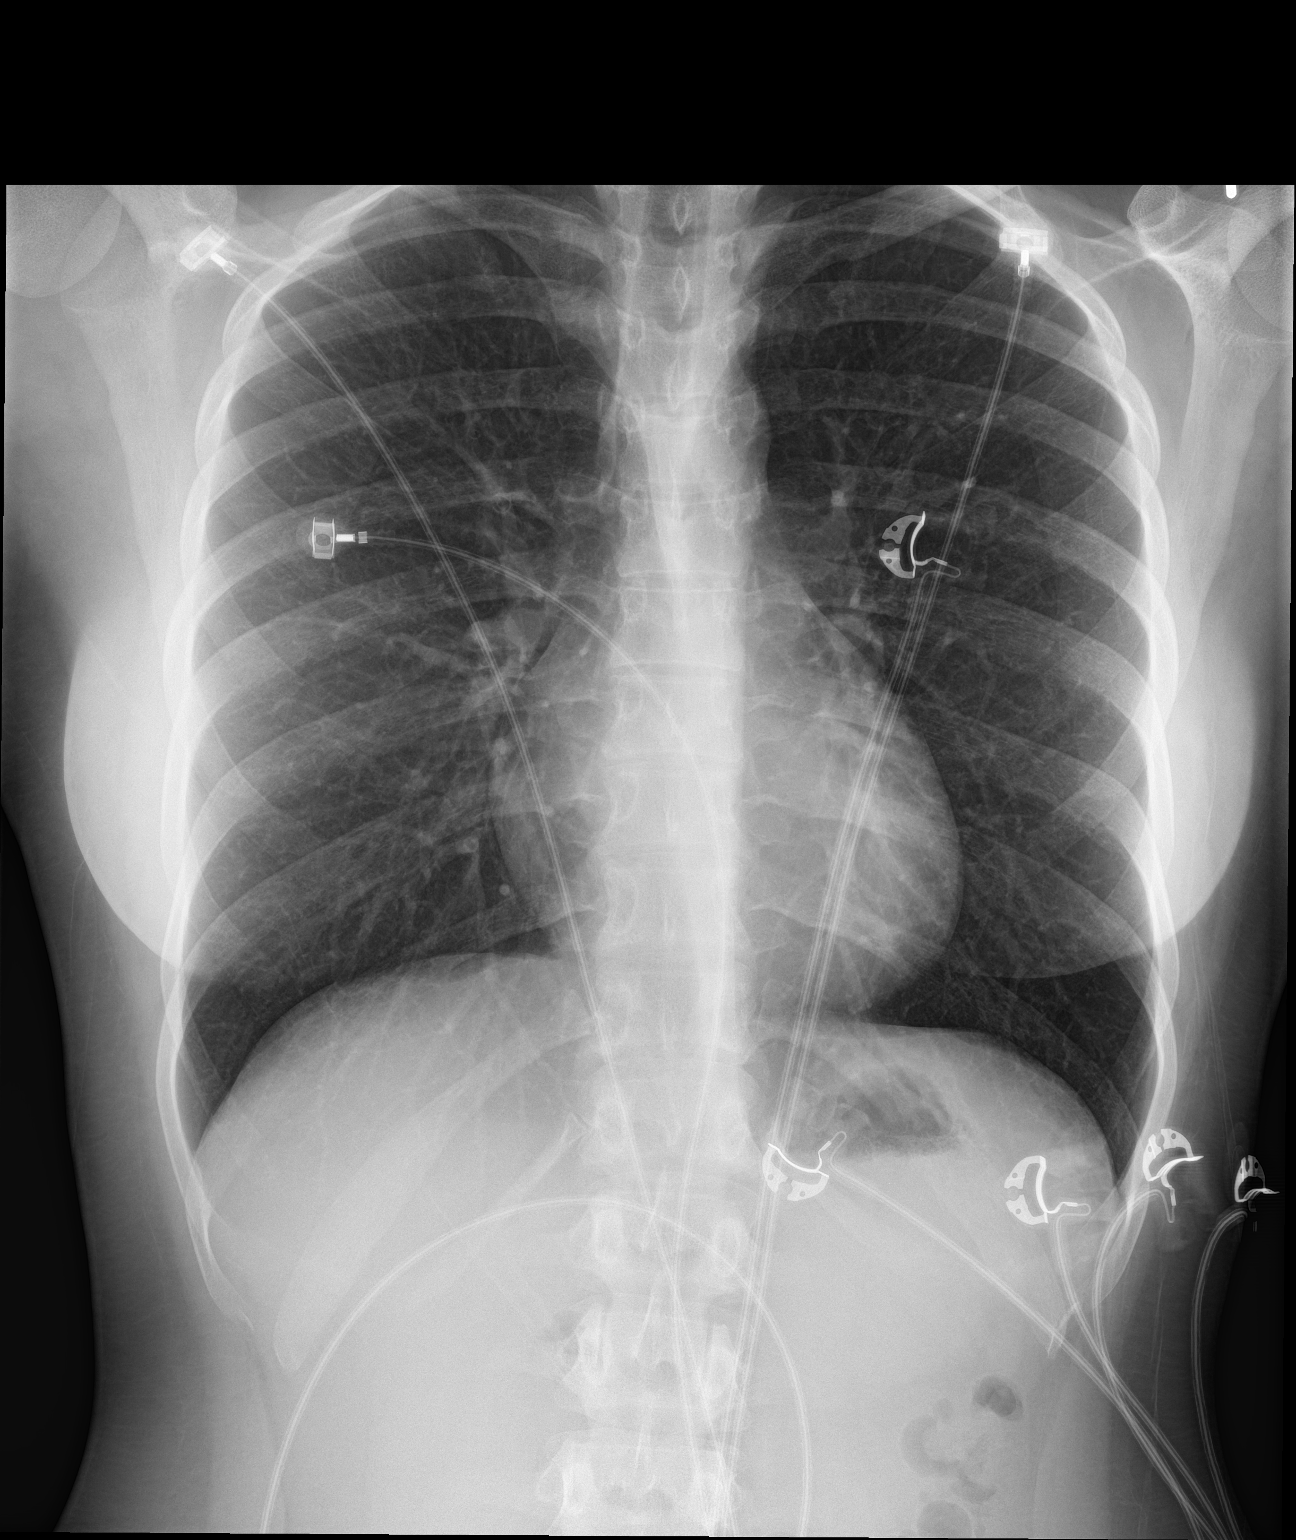

[chest lat]
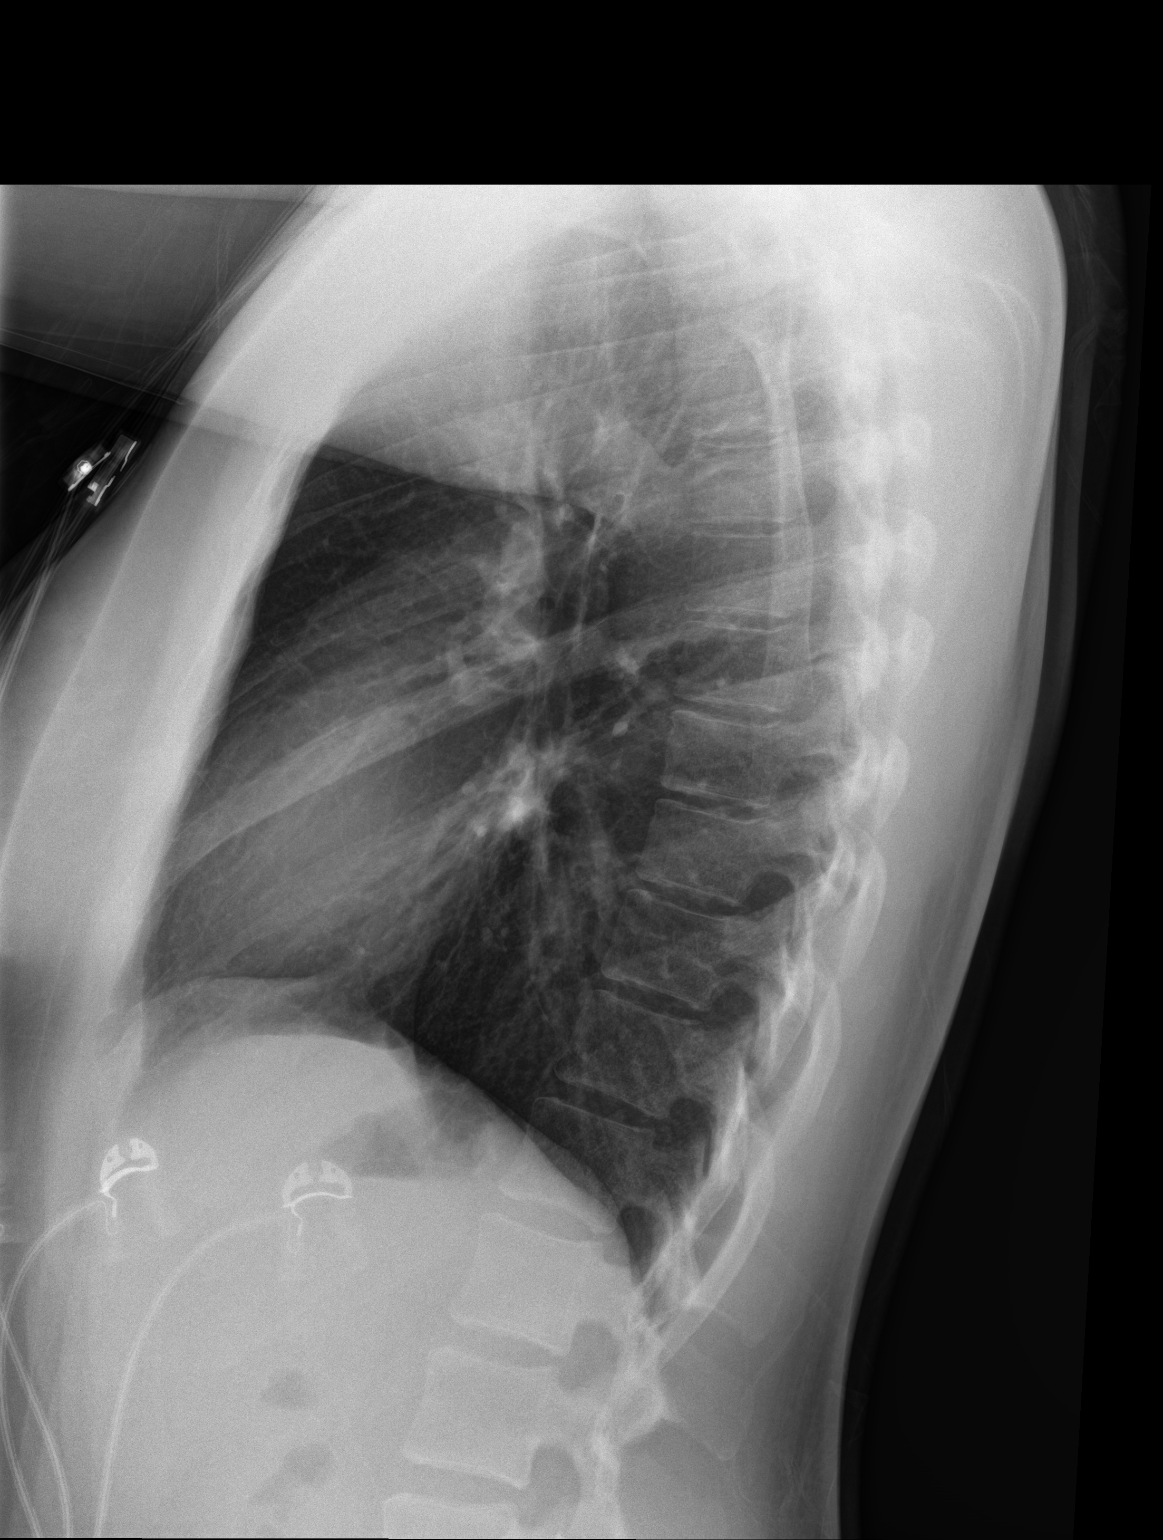

[2 of 2 positions shown; findings below may reference images not displayed]

FINDINGS: Cardiomediastinal silhouette projects within normal limits in size
and contour. No confluent airspace disease, pneumothorax, or pleural
effusion.

No displaced fracture.

Unremarkable appearance of the upper abdomen.
IMPRESSION: No radiographic evidence of acute cardiopulmonary disease.

## 2016-06-07 ENCOUNTER — Encounter (HOSPITAL_COMMUNITY): Payer: Self-pay

## 2016-06-07 ENCOUNTER — Inpatient Hospital Stay (HOSPITAL_COMMUNITY)
Admission: AD | Admit: 2016-06-07 | Discharge: 2016-06-07 | Disposition: A | Discharge status: Home / Self Care | Payer: Medicaid Other | Source: Ambulatory Visit | Attending: Obstetrics & Gynecology | Admitting: Obstetrics & Gynecology

## 2016-06-07 DIAGNOSIS — O26893 Other specified pregnancy related conditions, third trimester: Secondary | ICD-10-CM | POA: Insufficient documentation

## 2016-06-07 DIAGNOSIS — Z3A35 35 weeks gestation of pregnancy: Secondary | ICD-10-CM | POA: Insufficient documentation

## 2016-06-07 DIAGNOSIS — R109 Unspecified abdominal pain: Secondary | ICD-10-CM | POA: Insufficient documentation

## 2016-06-07 DIAGNOSIS — G43109 Migraine with aura, not intractable, without status migrainosus: Secondary | ICD-10-CM | POA: Insufficient documentation

## 2016-06-07 DIAGNOSIS — R197 Diarrhea, unspecified: Secondary | ICD-10-CM | POA: Insufficient documentation

## 2016-06-07 LAB — URINALYSIS, ROUTINE W REFLEX MICROSCOPIC
Bilirubin Urine: NEGATIVE
Glucose, UA: NEGATIVE mg/dL
Ketones, ur: NEGATIVE mg/dL
LEUKOCYTES UA: NEGATIVE
NITRITE: NEGATIVE
PROTEIN: NEGATIVE mg/dL
SPECIFIC GRAVITY, URINE: 1.01 (ref 1.005–1.030)
pH: 7 (ref 5.0–8.0)

## 2016-06-07 LAB — URINE MICROSCOPIC-ADD ON

## 2016-06-07 NOTE — MAU Note (Signed)
Pt sent home with labor precautions, discharge papers reviewed. Electronic signature pad not working, had patient sign papers and put in with paper chart.

## 2016-06-07 NOTE — Discharge Instructions (Signed)
Preterm Birth °Preterm birth is a birth that happens before 37 weeks of pregnancy. Most pregnancies last about 39-41 weeks. Every week in the womb is important and is beneficial to the health of the infant. Infants born before 37 weeks of pregnancy are at a higher risk for complications. Depending on when the infant was born, he or she may be: °· Late preterm. Born between 32 weeks and 37 weeks of pregnancy. °· Very preterm. Born at less than 32 weeks of pregnancy. °· Extremely preterm. Born at less than 25 weeks of pregnancy. °The earlier a baby is born, the more likely the child will have issues related to prematurity. Complications and problems that can be seen in infants born too early include: °· Problems breathing (respiratory distress syndrome). °· Low birth weight. °· Problems feeding. °· Sleeping problems. °· Yellowing of the skin (jaundice). °· Infections such as pneumonia.  °Babies born very preterm or extremely preterm are at risk for more serious medical issues. These include: °· More severe breathing issues. °· Eyesight issues. °· Brain development issues (intraventricular hemorrhage). °· Behavioral and emotional development issues. °· Growth and developmental delays. °· Cerebral palsy. °· Serious feeding or bowel complications (necrotizing enterocolitis). °CAUSES  °There are two broad categories of preterm birth. °· Spontaneous preterm birth. This is a birth resulting from preterm labor (not medically induced) or preterm premature rupture of membranes (PPROM). °· Indicated preterm birth. This is a birth resulting from labor being medically induced due to health, personal, or social reasons. °RISK FACTORS °Preterm birth may be related to certain medical conditions, lifestyle factors, or demographic factors encountered by the mother or fetus. °· Medical conditions include: °¨ Multiple gestations (twins, triplets, and so on). °¨ Infection. °¨ Diabetes. °¨ Heart disease. °¨ Kidney disease. °¨ Cervical or  uterine abnormalities. °¨ Being underweight. °¨ High blood pressure or preeclampsia. °¨ Premature rupture of membranes (PROM). °¨ Birth defects in the fetus. °· Lifestyle factors include: °¨ Poor prenatal care. °¨ Poor nutrition or anemia. °¨ Cigarette smoking. °¨ Consuming alcohol. °¨ High levels of stress and lack of social or emotional support. °¨ Exposure to chemical or environmental toxins. °¨ Substance abuse. °· Demographic factors include: °¨ African-American ethnicity. °¨ Age (younger than 18 or older than 22 years of age). °¨ Low socioeconomic status. °Women with a history of preterm labor or who become pregnant within 18 months of giving birth are also at increased risk for preterm birth. °DIAGNOSIS  °Your health care provider may request additional tests to diagnose underlying complications resulting from preterm birth. Tests on the infant may include: °· Physical exam. °· Blood tests. °· Chest X-rays. °· Heart-lung monitoring. °TREATMENT  °After birth, special care will be taken to assess any problems or complications for the infant. Supportive care will be provided for the infant. Treatment depends on what problems are present and any complications that develop. Some preterm infants are cared for in a neonatal intensive care unit. In general, care may include: °· Maintaining temperature and oxygen in a clear heated box (baby isolette). °· Monitoring the infant's heart rate, breathing, and level of oxygen in the blood. °· Monitoring for signs of infection and, if needed, giving IV antibiotic medicine. °· Inserting a feeding tube (nose, mouth) or giving IV nutrition if unable to feed. °· Inserting a breathing tube (ventilation). °· Respiration support (continuous positive airway pressure [CPAP] or oxygen).  °Treatment will change as the infant builds up strength and is able to breathe and eat on his or her   own. For some infants, no special treatment is necessary. Parents may be educated on the potential  health risks of prematurity to the infant. HOME CARE INSTRUCTIONS  Understand your infant's special conditions and needs. It may be reassuring to learn about infant CPR.  Monitor your infant in the car seat until he or she grows and matures. Infant car seats can cause breathing difficulties for preterm infants.  Keep your infant warm. Dress your infant in layers and keep him or her away from drafts, especially in cold months of the year.  Wash your hands thoroughly after going to the bathroom or changing a diaper. Late preterm infants may be more prone to infection.  Follow all your health care provider's instructions for providing support and care to your preterm infant.  Get support from organizations and groups that understand your challenges.  Follow up with your infant's health care provider as directed. Prevention There are some things you can do to help lower your risk of having a preterm infant in the future. These include:  Good prenatal care throughout the entire pregnancy. See a health care provider regularly for advice and tests.  Management of underlying medical conditions.  Proper self-care and lifestyle changes.  Proper diet and weight control.  Watching for signs of various infections. SEEK MEDICAL CARE IF:  Your infant has feeding difficulties.  Your infant has sleeping difficulties.  Your infant has breathing difficulties.  Your infant's skin starts to look yellow.  Your infant shows signs of infection, such as a stuffy nose, fever, crying, or bluish color of the skin. FOR MORE INFORMATION [employer]: www.marchofdimes.com Prematurity.org: www.prematurity.org   This information is not intended to replace advice given to you by your health care provider. Make sure you discuss any questions you have with your health care provider.   Document Released: 10/03/2003 Document Revised: 05/03/2013 Document Reviewed: 02/09/2013 Elsevier Interactive Patient  Education [employer].
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
## 2016-06-07 NOTE — MAU Note (Signed)
Pt presents complaining of diarrhea since yesterday and cramps that started today and are getting worse. Denies vaginal bleeding. Denies leaking of fluid.

## 2016-06-11 ENCOUNTER — Ambulatory Visit (INDEPENDENT_AMBULATORY_CARE_PROVIDER_SITE_OTHER): Payer: Medicaid Other | Admitting: Obstetrics and Gynecology

## 2016-06-11 VITALS — BP 110/68 | HR 96 | Wt 173.8 lb

## 2016-06-11 DIAGNOSIS — Z3A36 36 weeks gestation of pregnancy: Secondary | ICD-10-CM

## 2016-06-11 DIAGNOSIS — Z3493 Encounter for supervision of normal pregnancy, unspecified, third trimester: Secondary | ICD-10-CM

## 2016-06-11 DIAGNOSIS — Z331 Pregnant state, incidental: Secondary | ICD-10-CM

## 2016-06-11 DIAGNOSIS — Z3483 Encounter for supervision of other normal pregnancy, third trimester: Secondary | ICD-10-CM

## 2016-06-11 DIAGNOSIS — Z1389 Encounter for screening for other disorder: Secondary | ICD-10-CM

## 2016-06-11 LAB — POCT URINALYSIS DIPSTICK
GLUCOSE UA: NEGATIVE
Ketones, UA: NEGATIVE
NITRITE UA: NEGATIVE
Protein, UA: NEGATIVE
RBC UA: NEGATIVE

## 2016-06-11 NOTE — Progress Notes (Signed)
Patient ID: Ruth Dunlap, female   DOB: 1995-12-10, 20 y.o.   MRN: [id number] [redacted]w[redacted]d Estimated Date of Delivery: 07/06/16 LROB  Patient reports good fetal movement, denies any bleeding and no rupture of membranes symptoms or regular contractions. Patient complaints: denies any complaints at this time. Pt notes that she was seen at [facility] for preterm labor and spent 2 days in the hospital. Pt hasn't tried any medications for the relief of her symptoms. Pt denies any other symptoms.   Blood pressure 110/68, pulse 96, weight 173 lb 12.8 oz (78.8 kg), last menstrual period 08/11/2015, unknown if currently breastfeeding. refer to the ob flow sheet for FH and FHR, also BP, Wt, Urine results:notable for trace of leukocytes, otherwise negative                          Physical Examination:  General appearance - alert, well appearing, and in no distress Abdomen - FH 37 cm                   -FHR 143 soft, nontender, nondistended, no masses or organomegaly  Pelvic - normal external genitalia, vulva, vagina, cervix is -1 and 2 cm , very posterior, uterus and adnexa                                            Questions were answered. Assessment: LROB [id number] @ [redacted]w[redacted]d  Plan:  Continued routine obstetrical care, Group B Strep, GC/Chlamydia probe collected  F/u in 1 weeks for routine OB  By signing my name below, I, Soijett Blue, attest that this documentation has been prepared under the direction and in the presence of Jonnie Kind, MD. Electronically Signed: Soijett Blue, ED Scribe. 06/11/16. 3:14 PM.  I personally performed the services described in this documentation, which was SCRIBED in my presence. The recorded information has been reviewed and considered accurate. It has been edited as necessary during review. Jonnie Kind, MD

## 2016-06-13 LAB — GC/CHLAMYDIA PROBE AMP
Chlamydia trachomatis, NAA: NEGATIVE
Neisseria gonorrhoeae by PCR: NEGATIVE

## 2016-06-13 LAB — STREP GP B NAA: STREP GROUP B AG: NEGATIVE

## 2016-06-17 ENCOUNTER — Ambulatory Visit (INDEPENDENT_AMBULATORY_CARE_PROVIDER_SITE_OTHER): Payer: Medicaid Other | Admitting: Obstetrics and Gynecology

## 2016-06-17 ENCOUNTER — Encounter: Payer: Self-pay | Admitting: Obstetrics and Gynecology

## 2016-06-17 VITALS — BP 110/58 | HR 84 | Wt 175.6 lb

## 2016-06-17 DIAGNOSIS — Z3403 Encounter for supervision of normal first pregnancy, third trimester: Secondary | ICD-10-CM

## 2016-06-17 DIAGNOSIS — Z1389 Encounter for screening for other disorder: Secondary | ICD-10-CM

## 2016-06-17 DIAGNOSIS — Z3A38 38 weeks gestation of pregnancy: Secondary | ICD-10-CM | POA: Diagnosis not present

## 2016-06-17 DIAGNOSIS — O09213 Supervision of pregnancy with history of pre-term labor, third trimester: Secondary | ICD-10-CM

## 2016-06-17 DIAGNOSIS — O09899 Supervision of other high risk pregnancies, unspecified trimester: Secondary | ICD-10-CM

## 2016-06-17 DIAGNOSIS — O09219 Supervision of pregnancy with history of pre-term labor, unspecified trimester: Secondary | ICD-10-CM

## 2016-06-17 DIAGNOSIS — Z3483 Encounter for supervision of other normal pregnancy, third trimester: Secondary | ICD-10-CM | POA: Diagnosis not present

## 2016-06-17 DIAGNOSIS — Z331 Pregnant state, incidental: Secondary | ICD-10-CM

## 2016-06-17 DIAGNOSIS — Z3493 Encounter for supervision of normal pregnancy, unspecified, third trimester: Secondary | ICD-10-CM

## 2016-06-17 LAB — POCT URINALYSIS DIPSTICK
Glucose, UA: NEGATIVE
Ketones, UA: NEGATIVE
Leukocytes, UA: NEGATIVE
NITRITE UA: NEGATIVE
Protein, UA: NEGATIVE
RBC UA: NEGATIVE

## 2016-06-17 NOTE — Progress Notes (Addendum)
Patient ID: [name], female   DOB: 1996-01-12, 20 y.o.   MRN: [id number]  [id number]  Estimated Date of Delivery: 07/06/16 LROB [redacted]w[redacted]d  Blood pressure (!) 110/58, pulse 84, weight 175 lb 9.6 oz (79.7 kg), last menstrual period 08/11/2015, unknown if currently breastfeeding.    Urine results: notable for none  Chief Complaint  Patient presents with  . Routine Prenatal Visit    Patient complaints: none.  Patient reports good fetal movement. She denies any bleeding, rupture of membranes, or regular contractions.   Refer to the ob flow sheet for FH and FHR.    Physical Examination: General appearance - alert, well appearing, and in no distress                                      Abdomen - FH 38 cm,                                                         -FHR 137 bpm                                                         soft, nontender, nondistended, no masses or organomegaly, fetus vertex                                       Pelvic - VULVA: normal appearing vulva with no masses, tenderness or lesions,      VAGINA: normal appearing vagina with normal color and discharge, no lesions,      CERVIX: normal appearing cervix without discharge or lesions, closed, posterior                                             Questions were answered. Assessment: LROB [id number] @ [redacted]w[redacted]d  GBS negative   Plan:  Continued routine obstetrical care  F/u in 1 week for routine prenatal care, BP check    By signing my name below, I, Hansel Feinstein, attest that this documentation has been prepared under the direction and in the presence of Jonnie Kind, MD. Electronically Signed: Hansel Feinstein, ED Scribe. 06/17/16. 3:57 PM.  I personally performed the services described in this documentation, which was SCRIBED in my presence. The recorded information has been reviewed and considered accurate. It has been edited as necessary during review. Jonnie Kind, MD

## 2016-06-22 ENCOUNTER — Encounter (HOSPITAL_COMMUNITY): Payer: Self-pay | Admitting: *Deleted

## 2016-06-22 ENCOUNTER — Inpatient Hospital Stay (HOSPITAL_COMMUNITY): Payer: Medicaid Other | Admitting: Anesthesiology

## 2016-06-22 ENCOUNTER — Inpatient Hospital Stay (HOSPITAL_COMMUNITY)
Admission: AD | Admit: 2016-06-22 | Discharge: 2016-06-24 | DRG: 775 | Disposition: A | Discharge status: Home / Self Care | Payer: Medicaid Other | Source: Ambulatory Visit | Attending: Obstetrics and Gynecology | Admitting: Obstetrics and Gynecology

## 2016-06-22 ENCOUNTER — Telehealth: Payer: Self-pay | Admitting: *Deleted

## 2016-06-22 DIAGNOSIS — O4292 Full-term premature rupture of membranes, unspecified as to length of time between rupture and onset of labor: Principal | ICD-10-CM | POA: Diagnosis present

## 2016-06-22 DIAGNOSIS — Z8249 Family history of ischemic heart disease and other diseases of the circulatory system: Secondary | ICD-10-CM

## 2016-06-22 DIAGNOSIS — Z833 Family history of diabetes mellitus: Secondary | ICD-10-CM | POA: Diagnosis not present

## 2016-06-22 DIAGNOSIS — O4290 Premature rupture of membranes, unspecified as to length of time between rupture and onset of labor, unspecified weeks of gestation: Secondary | ICD-10-CM | POA: Diagnosis present

## 2016-06-22 DIAGNOSIS — Z3A38 38 weeks gestation of pregnancy: Secondary | ICD-10-CM

## 2016-06-22 HISTORY — DX: Unspecified injury of liver, initial encounter: S36.119A

## 2016-06-22 HISTORY — DX: Depression, unspecified: F32.A

## 2016-06-22 HISTORY — DX: Major depressive disorder, single episode, unspecified: F32.9

## 2016-06-22 HISTORY — DX: Unspecified injury of stomach, initial encounter: S36.30XA

## 2016-06-22 HISTORY — DX: Unspecified injury of unspecified part of pancreas, initial encounter: S36.209A

## 2016-06-22 HISTORY — DX: Unspecified injury of unspecified kidney, initial encounter: S37.009A

## 2016-06-22 LAB — TYPE AND SCREEN
ABO/RH(D): AB POS
Antibody Screen: NEGATIVE

## 2016-06-22 LAB — CBC
HEMATOCRIT: 28.6 % — AB (ref 36.0–46.0)
HEMOGLOBIN: 9.3 g/dL — AB (ref 12.0–15.0)
MCH: 26.1 pg (ref 26.0–34.0)
MCHC: 32.5 g/dL (ref 30.0–36.0)
MCV: 80.1 fL (ref 78.0–100.0)
PLATELETS: 218 10*3/uL (ref 150–400)
RBC: 3.57 MIL/uL — AB (ref 3.87–5.11)
RDW: 15.7 % — ABNORMAL HIGH (ref 11.5–15.5)
WBC: 10.2 10*3/uL (ref 4.0–10.5)

## 2016-06-22 LAB — POCT FERN TEST: POCT Fern Test: POSITIVE

## 2016-06-22 MED ORDER — PHENYLEPHRINE 40 MCG/ML (10ML) SYRINGE FOR IV PUSH (FOR BLOOD PRESSURE SUPPORT)
80.0000 ug | PREFILLED_SYRINGE | INTRAVENOUS | Status: DC | PRN
  Filled 2016-06-22: qty 5

## 2016-06-22 MED ORDER — LACTATED RINGERS IV SOLN: 500.0000 mL | INTRAVENOUS | Status: DC | PRN

## 2016-06-22 MED ORDER — ACETAMINOPHEN 325 MG PO TABS: 650.0000 mg | ORAL_TABLET | ORAL | Status: DC | PRN

## 2016-06-22 MED ORDER — OXYTOCIN BOLUS FROM INFUSION
500.0000 mL | Freq: Once | INTRAVENOUS | Status: AC
  Administered 2016-06-22: 500 mL via INTRAVENOUS

## 2016-06-22 MED ORDER — LACTATED RINGERS IV SOLN: 500.0000 mL | Freq: Once | INTRAVENOUS | Status: DC

## 2016-06-22 MED ORDER — FENTANYL 2.5 MCG/ML BUPIVACAINE 1/10 % EPIDURAL INFUSION (WH - ANES)
INTRAMUSCULAR | Status: AC
  Filled 2016-06-22: qty 100

## 2016-06-22 MED ORDER — FENTANYL 2.5 MCG/ML BUPIVACAINE 1/10 % EPIDURAL INFUSION (WH - ANES): 14.0000 mL/h | INTRAMUSCULAR | Status: DC | PRN

## 2016-06-22 MED ORDER — OXYTOCIN 40 UNITS IN LACTATED RINGERS INFUSION - SIMPLE MED
2.5000 [IU]/h | INTRAVENOUS | Status: DC
  Administered 2016-06-22: 2.5 [IU]/h via INTRAVENOUS

## 2016-06-22 MED ORDER — FENTANYL CITRATE (PF) 100 MCG/2ML IJ SOLN
100.0000 ug | INTRAMUSCULAR | Status: DC | PRN
  Administered 2016-06-22: 100 ug via INTRAVENOUS
  Filled 2016-06-22: qty 2

## 2016-06-22 MED ORDER — SOD CITRATE-CITRIC ACID 500-334 MG/5ML PO SOLN: 30.0000 mL | ORAL | Status: DC | PRN

## 2016-06-22 MED ORDER — DIPHENHYDRAMINE HCL 50 MG/ML IJ SOLN: 12.5000 mg | INTRAMUSCULAR | Status: DC | PRN

## 2016-06-22 MED ORDER — LIDOCAINE HCL (PF) 1 % IJ SOLN
30.0000 mL | INTRAMUSCULAR | Status: DC | PRN
  Filled 2016-06-22: qty 30

## 2016-06-22 MED ORDER — IBUPROFEN 600 MG PO TABS
600.0000 mg | ORAL_TABLET | Freq: Four times a day (QID) | ORAL | Status: DC
  Administered 2016-06-23 – 2016-06-24 (×6): 600 mg via ORAL
  Filled 2016-06-22 (×6): qty 1

## 2016-06-22 MED ORDER — LACTATED RINGERS IV SOLN
INTRAVENOUS | Status: DC
  Administered 2016-06-22: 15:00:00 via INTRAVENOUS

## 2016-06-22 MED ORDER — OXYCODONE-ACETAMINOPHEN 5-325 MG PO TABS: 1.0000 | ORAL_TABLET | ORAL | Status: DC | PRN

## 2016-06-22 MED ORDER — LIDOCAINE HCL (PF) 1 % IJ SOLN
INTRAMUSCULAR | Status: DC | PRN
  Administered 2016-06-22 (×2): 5 mL

## 2016-06-22 MED ORDER — FENTANYL 2.5 MCG/ML BUPIVACAINE 1/10 % EPIDURAL INFUSION (WH - ANES)
14.0000 mL/h | INTRAMUSCULAR | Status: DC | PRN
  Administered 2016-06-22: 14 mL/h via EPIDURAL

## 2016-06-22 MED ORDER — EPHEDRINE 5 MG/ML INJ: 10.0000 mg | INTRAVENOUS | Status: DC | PRN

## 2016-06-22 MED ORDER — EPHEDRINE 5 MG/ML INJ
10.0000 mg | INTRAVENOUS | Status: DC | PRN
  Filled 2016-06-22: qty 4

## 2016-06-22 MED ORDER — PHENYLEPHRINE 40 MCG/ML (10ML) SYRINGE FOR IV PUSH (FOR BLOOD PRESSURE SUPPORT): 80.0000 ug | PREFILLED_SYRINGE | INTRAVENOUS | Status: DC | PRN

## 2016-06-22 MED ORDER — ONDANSETRON HCL 4 MG/2ML IJ SOLN: 4.0000 mg | Freq: Four times a day (QID) | INTRAMUSCULAR | Status: DC | PRN

## 2016-06-22 MED ORDER — PHENYLEPHRINE 40 MCG/ML (10ML) SYRINGE FOR IV PUSH (FOR BLOOD PRESSURE SUPPORT)
80.0000 ug | PREFILLED_SYRINGE | INTRAVENOUS | Status: DC | PRN
  Filled 2016-06-22: qty 5
  Filled 2016-06-22: qty 10

## 2016-06-22 MED ORDER — OXYTOCIN 40 UNITS IN LACTATED RINGERS INFUSION - SIMPLE MED
1.0000 m[IU]/min | INTRAVENOUS | Status: DC
Start: 2016-06-22 — End: 2016-06-24
  Administered 2016-06-22: 2 m[IU]/min via INTRAVENOUS
  Filled 2016-06-22: qty 1000

## 2016-06-22 MED ORDER — TERBUTALINE SULFATE 1 MG/ML IJ SOLN
0.2500 mg | Freq: Once | INTRAMUSCULAR | Status: DC | PRN
  Filled 2016-06-22: qty 1

## 2016-06-22 MED ORDER — OXYCODONE-ACETAMINOPHEN 5-325 MG PO TABS: 2.0000 | ORAL_TABLET | ORAL | Status: DC | PRN

## 2016-06-22 NOTE — Anesthesia Preprocedure Evaluation (Signed)
Anesthesia Evaluation  Patient identified by MRN, date of birth, ID band Patient awake    Reviewed: Allergy & Precautions, H&P , NPO status , Patient's Chart, lab work & pertinent test results  History of Anesthesia Complications Negative for: history of anesthetic complications  Airway Mallampati: II  TM Distance: >3 FB Neck ROM: full    Dental no notable dental hx. (+) Teeth Intact   Pulmonary neg pulmonary ROS,    Pulmonary exam normal breath sounds clear to auscultation       Cardiovascular negative cardio ROS Normal cardiovascular exam Rhythm:regular Rate:Normal     Neuro/Psych negative neurological ROS  negative psych ROS   GI/Hepatic negative GI ROS, Neg liver ROS,   Endo/Other  negative endocrine ROS  Renal/GU negative Renal ROS  negative genitourinary   Musculoskeletal negative musculoskeletal ROS (+)   Abdominal   Peds negative pediatric ROS (+)  Hematology negative hematology ROS (+)   Anesthesia Other Findings   Reproductive/Obstetrics (+) Pregnancy                             Anesthesia Physical Anesthesia Plan  ASA: II  Anesthesia Plan: Epidural   Post-op Pain Management:    Induction:   Airway Management Planned:   Additional Equipment:   Intra-op Plan:   Post-operative Plan:   Informed Consent: I have reviewed the patients History and Physical, chart, labs and discussed the procedure including the risks, benefits and alternatives for the proposed anesthesia with the patient or authorized representative who has indicated his/her understanding and acceptance.     Plan Discussed with:   Anesthesia Plan Comments:         Anesthesia Quick Evaluation

## 2016-06-22 NOTE — Telephone Encounter (Signed)
Pt states she had a gush of water after using the bathroom this am, no vaginal bleeding.  Pt informed to go to [facility] per Derrek Monaco, NP. Pt verbalized understanding.

## 2016-06-22 NOTE — MAU Note (Signed)
States had a gush of fluid around 12N. Feeling some contractions.

## 2016-06-22 NOTE — Anesthesia Pain Management Evaluation Note (Signed)
  CRNA Pain Management Visit Note  Patient: Ruth Dunlap, 20 y.o., female  "Hello I am a member of the anesthesia team at [facility]. We have an anesthesia team available at all times to provide care throughout the hospital, including epidural management and anesthesia for C-section. I don't know your plan for the delivery whether it a natural birth, water birth, IV sedation, nitrous supplementation, doula or epidural, but we want to meet your pain goals."   1.Was your pain managed to your expectations on prior hospitalizations?   Yes   2.What is your expectation for pain management during this hospitalization?     Epidural  3.How can we help you reach that goal? epidural  Record the patient's initial score and the patient's pain goal.   Pain: 0  Pain Goal: 4 [facility] wants you to be able to say your pain was always managed very well.  Ruth Dunlap 06/22/2016

## 2016-06-22 NOTE — Anesthesia Procedure Notes (Signed)
Epidural Patient location during procedure: OB  Staffing Anesthesiologist: Lola Czerwonka Performed: anesthesiologist   Preanesthetic Checklist Completed: patient identified, site marked, surgical consent, pre-op evaluation, timeout performed, IV checked, risks and benefits discussed and monitors and equipment checked  Epidural Patient position: sitting Prep: DuraPrep Patient monitoring: heart rate, continuous pulse ox and blood pressure Approach: right paramedian Location: L3-L4 Injection technique: LOR saline  Needle:  Needle type: Tuohy  Needle gauge: 17 G Needle length: 9 cm and 9 Needle insertion depth: 6 cm Catheter type: closed end flexible Catheter size: 20 Guage Catheter at skin depth: 10 cm Test dose: negative  Assessment Events: blood not aspirated, injection not painful, no injection resistance, negative IV test and no paresthesia  Additional Notes Patient identified. Risks/Benefits/Options discussed with patient including but not limited to bleeding, infection, nerve damage, paralysis, failed block, incomplete pain control, headache, blood pressure changes, nausea, vomiting, reactions to medication both or allergic, itching and postpartum back pain. Confirmed with bedside nurse the patient's most recent platelet count. Confirmed with patient that they are not currently taking any anticoagulation, have any bleeding history or any family history of bleeding disorders. Patient expressed understanding and wished to proceed. All questions were answered. Sterile technique was used throughout the entire procedure. Please see nursing notes for vital signs. Test dose was given through epidural needle and negative prior to continuing to dose epidural or start infusion. Warning signs of high block given to the patient including shortness of breath, tingling/numbness in hands, complete motor block, or any concerning symptoms with instructions to call for help. Patient was given  instructions on fall risk and not to get out of bed. All questions and concerns addressed with instructions to call with any issues.     
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
## 2016-06-23 LAB — CBC
HEMATOCRIT: 29.7 % — AB (ref 36.0–46.0)
Hemoglobin: 9.6 g/dL — ABNORMAL LOW (ref 12.0–15.0)
MCH: 25.8 pg — AB (ref 26.0–34.0)
MCHC: 32.3 g/dL (ref 30.0–36.0)
MCV: 79.8 fL (ref 78.0–100.0)
PLATELETS: 214 10*3/uL (ref 150–400)
RBC: 3.72 MIL/uL — ABNORMAL LOW (ref 3.87–5.11)
RDW: 15.8 % — AB (ref 11.5–15.5)
WBC: 13.3 10*3/uL — ABNORMAL HIGH (ref 4.0–10.5)

## 2016-06-23 LAB — RPR: RPR Ser Ql: NONREACTIVE

## 2016-06-23 LAB — HIV ANTIBODY (ROUTINE TESTING W REFLEX): HIV Screen 4th Generation wRfx: NONREACTIVE

## 2016-06-23 MED ORDER — COCONUT OIL OIL: 1.0000 "application " | TOPICAL_OIL | Status: DC | PRN

## 2016-06-23 MED ORDER — ACETAMINOPHEN 325 MG PO TABS: 650.0000 mg | ORAL_TABLET | ORAL | Status: DC | PRN

## 2016-06-23 MED ORDER — PRENATAL MULTIVITAMIN CH
1.0000 | ORAL_TABLET | Freq: Every day | ORAL | Status: DC
  Administered 2016-06-23: 1 via ORAL
  Filled 2016-06-23: qty 1

## 2016-06-23 MED ORDER — SENNOSIDES-DOCUSATE SODIUM 8.6-50 MG PO TABS: 2.0000 | ORAL_TABLET | ORAL | Status: DC

## 2016-06-23 MED ORDER — ONDANSETRON HCL 4 MG/2ML IJ SOLN: 4.0000 mg | INTRAMUSCULAR | Status: DC | PRN

## 2016-06-23 MED ORDER — DIPHENHYDRAMINE HCL 25 MG PO CAPS: 25.0000 mg | ORAL_CAPSULE | Freq: Four times a day (QID) | ORAL | Status: DC | PRN

## 2016-06-23 MED ORDER — ONDANSETRON HCL 4 MG PO TABS: 4.0000 mg | ORAL_TABLET | ORAL | Status: DC | PRN

## 2016-06-23 MED ORDER — DIBUCAINE 1 % RE OINT: 1.0000 "application " | TOPICAL_OINTMENT | RECTAL | Status: DC | PRN

## 2016-06-23 MED ORDER — TETANUS-DIPHTH-ACELL PERTUSSIS 5-2.5-18.5 LF-MCG/0.5 IM SUSP: 0.5000 mL | Freq: Once | INTRAMUSCULAR | Status: DC

## 2016-06-23 MED ORDER — BENZOCAINE-MENTHOL 20-0.5 % EX AERO: 1.0000 "application " | INHALATION_SPRAY | CUTANEOUS | Status: DC | PRN

## 2016-06-23 MED ORDER — SIMETHICONE 80 MG PO CHEW: 80.0000 mg | CHEWABLE_TABLET | ORAL | Status: DC | PRN

## 2016-06-23 MED ORDER — ZOLPIDEM TARTRATE 5 MG PO TABS: 5.0000 mg | ORAL_TABLET | Freq: Every evening | ORAL | Status: DC | PRN

## 2016-06-23 MED ORDER — WITCH HAZEL-GLYCERIN EX PADS: 1.0000 "application " | MEDICATED_PAD | CUTANEOUS | Status: DC | PRN

## 2016-06-23 NOTE — Progress Notes (Signed)
CSW acknowledged consult and attempted to meet with MOB.  However, bedside nurse was attending to family.  CSW will attempt to meet with MOB at a later time.   Laurey Arrow, MSW, LCSW Clinical Social Work [phone]

## 2016-06-23 NOTE — Progress Notes (Signed)
UR chart review completed.  

## 2016-06-23 NOTE — Progress Notes (Signed)
POSTPARTUM PROGRESS NOTE  Post Partum Day 1 Subjective:  Ruth Dunlap is a 20 y.o. [id number] [redacted]w[redacted]d s/p SVD.  No acute events overnight.  Pt denies problems with ambulating, voiding or po intake.  She denies nausea or vomiting.  Pain is moderately controlled.  She has had flatus. She has not had bowel movement.  Lochia Minimal.   Objective: Blood pressure 115/64, pulse 67, temperature 98.2 F (36.8 C), temperature source Oral, resp. rate 18, height 5\' 6"  (1.676 m), weight 174 lb (78.9 kg), last menstrual period 08/11/2015, SpO2 98 %, unknown if currently breastfeeding.  Physical Exam:  General: alert, cooperative and no distress Lochia:normal flow Chest: no respiratory distress Heart:regular rate, distal pulses intact Abdomen: soft, nontender,  Uterine Fundus: firm, appropriately tender DVT Evaluation: No calf swelling or tenderness Extremities: no edema   Recent Labs  06/22/16 1445 06/23/16 0547  HGB 9.3* 9.6*  HCT 28.6* 29.7*    Assessment/Plan:  ASSESSMENT: Ruth Dunlap is a 20 y.o. [id number] [redacted]w[redacted]d s/p SVD  Plan for discharge tomorrow and Contraception depo   LOS: 1 day   Brayton Mars 06/23/2016, 1:44 PM

## 2016-06-23 NOTE — Progress Notes (Signed)
Post Partum Day 1 Subjective: no complaints, voiding and + flatus  Objective: Blood pressure 115/64, pulse 67, temperature 98.2 F (36.8 C), temperature source Oral, resp. rate 18, height 5\' 6"  (1.676 m), weight 78.9 kg (174 lb), last menstrual period 08/11/2015, SpO2 98 %, unknown if currently breastfeeding.  Physical Exam:  General: alert, cooperative and no distress Lochia: appropriate Uterine Fundus: firm Incision: none DVT Evaluation: No evidence of DVT seen on physical exam.   Recent Labs  06/22/16 1445 06/23/16 0547  HGB 9.3* 9.6*  HCT 28.6* 29.7*    Assessment/Plan: Plan for discharge tomorrow   LOS: 1 day   Carles Collet 06/23/2016, 7:39 AM   OB [place]  I have seen and examined this patient. Note this is a Careers information officer note and as such does not necessarily reflect the patient's plan of care. Please see [name] MD's note for this date of service.    [name] 06/23/2016, 1:45 PM

## 2016-06-23 NOTE — Anesthesia Postprocedure Evaluation (Signed)
Anesthesia Post Note  Patient: [name]  Procedure(s) Performed: * No procedures listed *  Patient location during evaluation: Mother Baby Anesthesia Type: Epidural Level of consciousness: awake and alert Pain management: pain level controlled Vital Signs Assessment: post-procedure vital signs reviewed and stable Respiratory status: spontaneous breathing and nonlabored ventilation Cardiovascular status: stable Postop Assessment: no headache, epidural receding, patient able to bend at knees, adequate PO intake, no backache and no signs of nausea or vomiting Anesthetic complications: no     Last Vitals:  Vitals:   06/23/16 0115 06/23/16 0500  BP: 116/65 115/64  Pulse: 68 67  Resp: 18 18  Temp: 36.7 C 36.8 C    Last Pain:  Vitals:   06/23/16 0615  TempSrc:   PainSc: 5    Pain Goal:                 Jabier Mutton
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
## 2016-06-24 ENCOUNTER — Encounter: Payer: Medicaid Other | Admitting: Obstetrics and Gynecology

## 2016-06-24 MED ORDER — IBUPROFEN 600 MG PO TABS
600.0000 mg | ORAL_TABLET | Freq: Four times a day (QID) | ORAL | 0 refills | Status: DC
Start: 2016-06-24 — End: 2016-12-30

## 2016-06-24 MED ORDER — ACETAMINOPHEN 325 MG PO TABS: 650.0000 mg | ORAL_TABLET | ORAL | 0 refills | Status: DC | PRN

## 2016-06-24 NOTE — Discharge Summary (Signed)
OB Discharge Summary     Patient Name: Ruth Dunlap DOB: 03-17-1996 MRN: [id number]  Date of admission: 06/22/2016 Delivering MD: Rogue Bussing   Date of discharge: 06/24/2016  Admitting diagnosis: 38WKS,CTX,LEAKING Intrauterine pregnancy: [redacted]w[redacted]d     Secondary diagnosis:  Active Problems:   PROM (premature rupture of membranes)  Additional problems: none     Discharge diagnosis: Term Pregnancy Delivered                                                                                                Post partum procedures:none  Augmentation: Pitocin  Complications: None  Hospital course:  Onset of Labor With Vaginal Delivery     20 y.o. yo [id number] at [redacted]w[redacted]d was admitted in Active Labor on 06/22/2016. Patient had an uncomplicated labor course as follows:  Membrane Rupture Time/Date: 12:00 PM ,06/22/2016   Intrapartum Procedures: Episiotomy: None [1]                                         Lacerations:  Periurethral [8]  Patient had a delivery of a Viable infant. 06/22/2016  Information for the patient's newborn:  Ruth Dunlap, Ruth Dunlap [id number]  Delivery Method: Vaginal, Spontaneous Delivery (Filed from Delivery Summary)    Pateint had an uncomplicated postpartum course.  She is ambulating, tolerating a regular diet, passing flatus, and urinating well. Patient is discharged home in stable condition on 06/24/16.    Physical exam  Vitals:   06/23/16 0115 06/23/16 0500 06/23/16 1800 06/24/16 0552  BP: 116/65 115/64 (!) 122/57 (!) 107/48  Pulse: 68 67 92 (!) 59  Resp: 18 18 18 18   Temp: 98 F (36.7 C) 98.2 F (36.8 C)  98.5 F (36.9 C)  TempSrc: Oral Oral  Oral  SpO2: 100% 98%    Weight:      Height:       General: alert Lochia: appropriate Uterine Fundus: firm Incision: N/A DVT Evaluation: No evidence of DVT seen on physical exam. Negative Homan's sign. No cords or calf tenderness. Labs: Lab Results  Component Value Date   WBC 13.3 (H)  06/23/2016   HGB 9.6 (L) 06/23/2016   HCT 29.7 (L) 06/23/2016   MCV 79.8 06/23/2016   PLT 214 06/23/2016   CMP Latest Ref Rng & Units 03/31/2016  Glucose 65 - 99 mg/dL 98  BUN 6 - 20 mg/dL 10  Creatinine 0.44 - 1.00 mg/dL 0.53  Sodium 135 - 145 mmol/L 133(L)  Potassium 3.5 - 5.1 mmol/L 3.3(L)  Chloride 101 - 111 mmol/L 103  CO2 22 - 32 mmol/L 22  Calcium 8.9 - 10.3 mg/dL 8.6(L)  Total Protein 6.5 - 8.1 g/dL 6.6  Total Bilirubin 0.3 - 1.2 mg/dL 0.3  Alkaline Phos 38 - 126 U/L 56  AST 15 - 41 U/L 16  ALT 14 - 54 U/L 8(L)    Discharge instruction: per After Visit Summary and "Baby and Me Booklet".  After visit meds:    Medication List  STOP taking these medications   NIFEdipine 10 MG capsule Commonly known as:  PROCARDIA     TAKE these medications   acetaminophen 325 MG tablet Commonly known as:  TYLENOL Take 2 tablets (650 mg total) by mouth every 4 (four) hours as needed (for pain scale < 4ORtemperature>/=100.5 F).   ferrous sulfate 325 (65 FE) MG tablet Take 1 tablet (325 mg total) by mouth 2 (two) times daily with a meal.   ibuprofen 600 MG tablet Commonly known as:  ADVIL,MOTRIN Take 1 tablet (600 mg total) by mouth every 6 (six) hours.   prenatal vitamin w/FE, FA 27-1 MG Tabs tablet Take 1 tablet by mouth daily at 12 noon.       Diet: routine diet  Activity: Advance as tolerated. Pelvic rest for 6 weeks.   Outpatient follow up:6 weeks Follow up Appt:No future appointments. Follow up Visit:No Follow-up on file.  Postpartum contraception: Depo Provera  Newborn Data: Live born female  Birth Weight: 6 lb 13.5 oz (3105 g) APGAR: 9, 9  Baby Feeding: both Disposition:home with mother   06/24/2016 Ruth Dunlap I, DO  Midwife attestation I have seen and examined this patient and agree with above documentation in the resident's note.   Ruth Dunlap is a 20 y.o. [id number] s/p SVD.   Pain is well controlled.  Plan for birth control is  Depo-Provera.  Method of Feeding: both  PE:  BP (!) 107/48 (BP Location: Right Arm) Comment: mom is relaxed /Rn in notified  Pulse (!) 59   Temp 98.5 F (36.9 C) (Oral)   Resp 18   Ht 5\' 6"  (1.676 m)   Wt 78.9 kg (174 lb)   LMP 08/11/2015   SpO2 98%   Breastfeeding? Unknown   BMI 28.08 kg/m  Gen: well appearing Heart: reg rate Lungs: normal WOB Fundus firm Ext: soft, no pain, no edema   Recent Labs  06/22/16 1445 06/23/16 0547  HGB 9.3* 9.6*  HCT 28.6* 29.7*     Plan: discharge today - postpartum care discussed - f/u clinic in 6 weeks for postpartum visit   Julianne Handler, CNM 1:47 PM

## 2016-06-24 NOTE — Discharge Instructions (Signed)

## 2016-06-25 ENCOUNTER — Telehealth: Payer: Self-pay | Admitting: *Deleted

## 2016-06-25 NOTE — Telephone Encounter (Signed)
Ruth Dunlap with [facility] called about Ruth Dunlap's hemoglobin. Today it was 9.8. Ruth Dunlap had a vaginal delivery 06/22/16. Ruth Dunlap had stopped taking her prenatal vit and iron but was instructed to start back. Also, was given info on iron rich foods. [place]

## 2016-07-06 ENCOUNTER — Encounter (HOSPITAL_COMMUNITY): Payer: Self-pay | Admitting: *Deleted

## 2016-07-06 ENCOUNTER — Emergency Department (HOSPITAL_COMMUNITY)
Admission: EM | Admit: 2016-07-06 | Discharge: 2016-07-07 | Disposition: A | Discharge status: Home / Self Care | Payer: Medicaid Other | Attending: Emergency Medicine | Admitting: Emergency Medicine

## 2016-07-06 DIAGNOSIS — B9789 Other viral agents as the cause of diseases classified elsewhere: Secondary | ICD-10-CM

## 2016-07-06 DIAGNOSIS — R05 Cough: Secondary | ICD-10-CM | POA: Diagnosis present

## 2016-07-06 DIAGNOSIS — Z79899 Other long term (current) drug therapy: Secondary | ICD-10-CM | POA: Insufficient documentation

## 2016-07-06 DIAGNOSIS — J069 Acute upper respiratory infection, unspecified: Secondary | ICD-10-CM | POA: Diagnosis not present

## 2016-07-06 NOTE — Discharge Instructions (Signed)
Return if any problems.  See your Physicain for recheck if needed.

## 2016-07-06 NOTE — ED Triage Notes (Signed)
Pt c/o cough that is productive with green sputum, nasal drainage, headache that started yesterday, daughter sick with some of the same symptoms,
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
## 2016-08-03 ENCOUNTER — Ambulatory Visit (INDEPENDENT_AMBULATORY_CARE_PROVIDER_SITE_OTHER): Payer: Medicaid Other | Admitting: Women's Health

## 2016-08-03 ENCOUNTER — Encounter: Payer: Self-pay | Admitting: Women's Health

## 2016-08-03 DIAGNOSIS — Z30011 Encounter for initial prescription of contraceptive pills: Secondary | ICD-10-CM

## 2016-08-03 DIAGNOSIS — Z3202 Encounter for pregnancy test, result negative: Secondary | ICD-10-CM

## 2016-08-03 LAB — POCT URINE PREGNANCY: PREG TEST UR: NEGATIVE

## 2016-08-03 MED ORDER — NORETHIN-ETH ESTRAD-FE BIPHAS 1 MG-10 MCG / 10 MCG PO TABS: 1.0000 | ORAL_TABLET | Freq: Every day | ORAL | 3 refills | Status: DC

## 2016-08-03 NOTE — Progress Notes (Signed)
Subjective:    Ruth Dunlap is a 21 y.o. G63P2103 African American female who presents for a postpartum visit. She is 6 weeks postpartum following a spontaneous vaginal delivery at 38.0 gestational weeks. Anesthesia: epidural. I have fully reviewed the prenatal and intrapartum course. Postpartum course has been uncomplicated. Baby's course has been uncomplicated. Baby is feeding by bottle. Bleeding LMP 1/1, small amt spotting today. Bowel function is normal. Bladder function is normal. Patient is sexually active. Last sexual activity: ~1wk ago. Contraception method is condoms and wants pills. Does not smoke, no h/o HTN, DVT/PE, CVA, MI, or migraines w/ aura. Postpartum depression screening: negative. Score 2. 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 States she's doing well this time. Last pap neg Jan 2017 @ [facility].  The following portions of the patient's history were reviewed and updated as appropriate: allergies, current medications, past medical history, past surgical history and problem list.  Review of Systems Pertinent items are noted in HPI.   Vitals:   08/03/16 1613  BP: 136/69  Pulse: 72  Weight: 163 lb (73.9 kg)  Height: 5\' 6"  (1.676 m)   Patient's last menstrual period was 07/27/2016.  Objective:   General:  alert, cooperative and no distress   Breasts:  deferred, no complaints  Lungs: clear to auscultation bilaterally  Heart:  regular rate and rhythm  Abdomen: soft, nontender   Vulva: normal  Vagina: normal vagina  Cervix:  closed  Corpus: Well-involuted  Adnexa:  Non-palpable  Rectal Exam: No hemorrhoids        Assessment:   Postpartum exam 6 wks s/p SVB H/o PPD Bottlefeeding Depression screening Contraception counseling   Plan:  Contraception: rx LoLoestrin 3pk w/ 3RF, condoms x 2wks Follow up in: 3 months for physical and coc f/u or earlier if needed  Tawnya Crook CNM, [facility] 08/03/2016 4:27 PM

## 2016-08-03 NOTE — Patient Instructions (Signed)
Oral Contraception Use Oral contraceptive pills (OCPs) are medicines taken to prevent pregnancy. OCPs work by preventing the ovaries from releasing eggs. The hormones in OCPs also cause the cervical mucus to thicken, preventing the sperm from entering the uterus. The hormones also cause the uterine lining to become thin, not allowing a fertilized egg to attach to the inside of the uterus. OCPs are highly effective when taken exactly as prescribed. However, OCPs do not prevent sexually transmitted diseases (STDs). Safe sex practices, such as using condoms along with an OCP, can help prevent STDs. Before taking OCPs, you may have a physical exam and Pap test. Your health care provider may also order blood tests if necessary. Your health care provider will make sure you are a good candidate for oral contraception. Discuss with your health care provider the possible side effects of the OCP you may be prescribed. When starting an OCP, it can take 2 to 3 months for the body to adjust to the changes in hormone levels in your body. How to take oral contraceptive pills Your health care provider may advise you on how to start taking the first cycle of OCPs. Otherwise, you can:  Start on day 1 of your menstrual period. You will not need any backup contraceptive protection with this start time.  Start on the first Sunday after your menstrual period or the day you get your prescription. In these cases, you will need to use backup contraceptive protection for the first week.  Start the pill at any time of your cycle. If you take the pill within 5 days of the start of your period, you are protected against pregnancy right away. In this case, you will not need a backup form of birth control. If you start at any other time of your menstrual cycle, you will need to use another form of birth control for 7 days. If your OCP is the type called a minipill, it will protect you from pregnancy after taking it for 2 days (48  hours).  After you have started taking OCPs:  If you forget to take 1 pill, take it as soon as you remember. Take the next pill at the regular time.  If you miss 2 or more pills, call your health care provider because different pills have different instructions for missed doses. Use backup birth control until your next menstrual period starts.  If you use a 28-day pack that contains inactive pills and you miss 1 of the last 7 pills (pills with no hormones), it will not matter. Throw away the rest of the non-hormone pills and start a new pill pack.  No matter which day you start the OCP, you will always start a new pack on that same day of the week. Have an extra pack of OCPs and a backup contraceptive method available in case you miss some pills or lose your OCP pack. Follow these instructions at home:  Do not smoke.  Always use a condom to protect against STDs. OCPs do not protect against STDs.  Use a calendar to mark your menstrual period days.  Read the information and directions that came with your OCP. Talk to your health care provider if you have questions. Contact a health care provider if:  You develop nausea and vomiting.  You have abnormal vaginal discharge or bleeding.  You develop a rash.  You miss your menstrual period.  You are losing your hair.  You need treatment for mood swings or depression.  You   get dizzy when taking the OCP.  You develop acne from taking the OCP.  You become pregnant. Get help right away if:  You develop chest pain.  You develop shortness of breath.  You have an uncontrolled or severe headache.  You develop numbness or slurred speech.  You develop visual problems.  You develop pain, redness, and swelling in the legs. This information is not intended to replace advice given to you by your health care provider. Make sure you discuss any questions you have with your health care provider. Document Released: 07/02/2011 Document  Revised: 12/19/2015 Document Reviewed: 01/01/2013 Elsevier Interactive Patient Education  2017 [employer]  

## 2016-09-06 ENCOUNTER — Emergency Department (HOSPITAL_COMMUNITY): Payer: Medicaid Other

## 2016-09-06 ENCOUNTER — Emergency Department (HOSPITAL_COMMUNITY)
Admission: EM | Admit: 2016-09-06 | Discharge: 2016-09-06 | Disposition: A | Discharge status: Home / Self Care | Payer: Medicaid Other | Attending: Emergency Medicine | Admitting: Emergency Medicine

## 2016-09-06 ENCOUNTER — Encounter (HOSPITAL_COMMUNITY): Payer: Self-pay | Admitting: Emergency Medicine

## 2016-09-06 DIAGNOSIS — R0789 Other chest pain: Secondary | ICD-10-CM | POA: Diagnosis present

## 2016-09-06 DIAGNOSIS — R079 Chest pain, unspecified: Secondary | ICD-10-CM

## 2016-09-06 LAB — CBC
HCT: 37 % (ref 36.0–46.0)
Hemoglobin: 12.1 g/dL (ref 12.0–15.0)
MCH: 25.6 pg — AB (ref 26.0–34.0)
MCHC: 32.7 g/dL (ref 30.0–36.0)
MCV: 78.2 fL (ref 78.0–100.0)
PLATELETS: 249 10*3/uL (ref 150–400)
RBC: 4.73 MIL/uL (ref 3.87–5.11)
RDW: 14.6 % (ref 11.5–15.5)
WBC: 7.3 10*3/uL (ref 4.0–10.5)

## 2016-09-06 LAB — BASIC METABOLIC PANEL
Anion gap: 8 (ref 5–15)
BUN: 13 mg/dL (ref 6–20)
CALCIUM: 9.4 mg/dL (ref 8.9–10.3)
CO2: 24 mmol/L (ref 22–32)
CREATININE: 0.89 mg/dL (ref 0.44–1.00)
Chloride: 106 mmol/L (ref 101–111)
Glucose, Bld: 103 mg/dL — ABNORMAL HIGH (ref 65–99)
Potassium: 3.9 mmol/L (ref 3.5–5.1)
SODIUM: 138 mmol/L (ref 135–145)

## 2016-09-06 LAB — TROPONIN I

## 2016-09-06 NOTE — Discharge Instructions (Signed)
Tests were good.   Follow-up your primary care doctor. °
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
## 2016-09-06 NOTE — ED Notes (Signed)
Pt with hx of panic attacks, who is 2 months post partum Now states panic is lessened and now she has a headache. She is alert, non photophobic, initially hypertensive

## 2016-09-06 NOTE — ED Notes (Signed)
Dr C in to assess 

## 2016-09-07 ENCOUNTER — Encounter (HOSPITAL_COMMUNITY): Payer: Self-pay | Admitting: Emergency Medicine

## 2016-09-07 ENCOUNTER — Emergency Department (HOSPITAL_COMMUNITY)
Admission: EM | Admit: 2016-09-07 | Discharge: 2016-09-07 | Disposition: A | Discharge status: Home / Self Care | Payer: Medicaid Other | Attending: Emergency Medicine | Admitting: Emergency Medicine

## 2016-09-07 DIAGNOSIS — G44219 Episodic tension-type headache, not intractable: Secondary | ICD-10-CM | POA: Diagnosis not present

## 2016-09-07 DIAGNOSIS — R51 Headache: Secondary | ICD-10-CM | POA: Diagnosis present

## 2016-09-07 MED ORDER — ACETAMINOPHEN 500 MG PO TABS
1000.0000 mg | ORAL_TABLET | Freq: Once | ORAL | Status: AC
  Administered 2016-09-07: 1000 mg via ORAL
  Filled 2016-09-07: qty 2
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
## 2016-09-07 NOTE — ED Notes (Signed)
Consumed 60cc water

## 2016-09-08 ENCOUNTER — Other Ambulatory Visit (HOSPITAL_COMMUNITY): Payer: Self-pay | Admitting: Obstetrics and Gynecology

## 2016-09-14 ENCOUNTER — Ambulatory Visit (INDEPENDENT_AMBULATORY_CARE_PROVIDER_SITE_OTHER): Payer: Medicaid Other | Admitting: Women's Health

## 2016-09-14 ENCOUNTER — Encounter: Payer: Self-pay | Admitting: Women's Health

## 2016-09-14 VITALS — BP 118/72 | HR 72 | Ht 66.0 in | Wt 164.0 lb

## 2016-09-14 DIAGNOSIS — F419 Anxiety disorder, unspecified: Secondary | ICD-10-CM

## 2016-09-14 DIAGNOSIS — F53 Postpartum depression: Secondary | ICD-10-CM

## 2016-09-14 DIAGNOSIS — O99345 Other mental disorders complicating the puerperium: Secondary | ICD-10-CM

## 2016-09-14 MED ORDER — ESCITALOPRAM OXALATE 10 MG PO TABS: 10.0000 mg | ORAL_TABLET | Freq: Every day | ORAL | 6 refills | Status: DC

## 2016-09-14 NOTE — Patient Instructions (Signed)
Postpartum Depression and Baby Blues The postpartum period begins right after the birth of a baby. During this time, there is often a great amount of joy and excitement. It is also a time of many changes in the life of the parents. Regardless of how many times a mother gives birth, each child brings new challenges and dynamics to the family. It is not unusual to have feelings of excitement along with confusing shifts in moods, emotions, and thoughts. All mothers are at risk of developing postpartum depression or the "baby blues." These mood changes can occur right after giving birth, or they may occur many months after giving birth. The baby blues or postpartum depression can be mild or severe. Additionally, postpartum depression can go away rather quickly, or it can be a long-term condition. What are the causes? Raised hormone levels and the rapid drop in those levels are thought to be a main cause of postpartum depression and the baby blues. A number of hormones change during and after pregnancy. Estrogen and progesterone usually decrease right after the delivery of your baby. The levels of thyroid hormone and various cortisol steroids also rapidly drop. Other factors that play a role in these mood changes include major life events and genetics. What increases the risk? If you have any of the following risks for the baby blues or postpartum depression, know what symptoms to watch out for during the postpartum period. Risk factors that may increase the likelihood of getting the baby blues or postpartum depression include:  Having a personal or family history of depression.  Having depression while being pregnant.  Having premenstrual mood issues or mood issues related to oral contraceptives.  Having a lot of life stress.  Having marital conflict.  Lacking a social support network.  Having a baby with special needs.  Having health problems, such as diabetes.  What are the signs or  symptoms? Symptoms of baby blues include:  Brief changes in mood, such as going from extreme happiness to sadness.  Decreased concentration.  Difficulty sleeping.  Crying spells, tearfulness.  Irritability.  Anxiety.  Symptoms of postpartum depression typically begin within the first month after giving birth. These symptoms include:  Difficulty sleeping or excessive sleepiness.  Marked weight loss.  Agitation.  Feelings of worthlessness.  Lack of interest in activity or food.  Postpartum psychosis is a very serious condition and can be dangerous. Fortunately, it is rare. Displaying any of the following symptoms is cause for immediate medical attention. Symptoms of postpartum psychosis include:  Hallucinations and delusions.  Bizarre or disorganized behavior.  Confusion or disorientation.  How is this diagnosed? A diagnosis is made by an evaluation of your symptoms. There are no medical or lab tests that lead to a diagnosis, but there are various questionnaires that a health care provider may use to identify those with the baby blues, postpartum depression, or psychosis. Often, a screening tool called the Edinburgh Postnatal Depression Scale is used to diagnose depression in the postpartum period. How is this treated? The baby blues usually goes away on its own in 1-2 weeks. Social support is often all that is needed. You will be encouraged to get adequate sleep and rest. Occasionally, you may be given medicines to help you sleep. Postpartum depression requires treatment because it can last several months or longer if it is not treated. Treatment may include individual or group therapy, medicine, or both to address any social, physiological, and psychological factors that may play a role in the   healthy diet, rest, and social support may also be strongly recommended. Postpartum psychosis is more serious and needs treatment right away. Hospitalization is  often needed. Follow these instructions at home:  Get as much rest as you can. Nap when the baby sleeps.  Exercise regularly. Some women find yoga and walking to be beneficial.  Eat a balanced and nourishing diet.  Do little things that you enjoy. Have a cup of tea, take a bubble bath, read your favorite magazine, or listen to your favorite music.  Avoid alcohol.  Ask for help with household chores, cooking, grocery shopping, or running errands as needed. Do not try to do everything.  Talk to people close to you about how you are feeling. Get support from your partner, family members, friends, or other new moms.  Try to stay positive in how you think. Think about the things you are grateful for.  Do not spend a lot of time alone.  Only take over-the-counter or prescription medicine as directed by your health care provider.  Keep all your postpartum appointments.  Let your health care provider know if you have any concerns. Contact a health care provider if: You are having a reaction to or problems with your medicine. Get help right away if:  You have suicidal feelings.  You think you may harm the baby or someone else. This information is not intended to replace advice given to you by your health care provider. Make sure you discuss any questions you have with your health care provider. Document Released: 04/16/2004 Document Revised: 12/19/2015 Document Reviewed: 04/24/2013 Elsevier Interactive Patient Education  2017 [employer] Inc[employer]  Generalized Anxiety Disorder Generalized anxiety disorder (GAD) is a mental disorder. It interferes with life functions, including relationships, work, and school. GAD is different from normal anxiety, which everyone experiences at some point in their lives in response to specific life events and activities. Normal anxiety actually helps [place] prepare for and get through these life events and activities. Normal anxiety goes away after the event or  activity is over.  GAD causes anxiety that is not necessarily related to specific events or activities. It also causes excess anxiety in proportion to specific events or activities. The anxiety associated with GAD is also difficult to control. GAD can vary from mild to severe. People with severe GAD can have intense waves of anxiety with physical symptoms (panic attacks).  SYMPTOMS The anxiety and worry associated with GAD are difficult to control. This anxiety and worry are related to many life events and activities and also occur more days than not for 6 months or longer. People with GAD also have three or more of the following symptoms (one or more in children):  Restlessness.   Fatigue.  Difficulty concentrating.   Irritability.  Muscle tension.  Difficulty sleeping or unsatisfying sleep. DIAGNOSIS GAD is diagnosed through an assessment by your health care provider. Your health care provider will ask you questions aboutyour mood,physical symptoms, and events in your life. Your health care provider may ask you about your medical history and use of alcohol or drugs, including prescription medicines. Your health care provider may also do a physical exam and blood tests. Certain medical conditions and the use of certain substances can cause symptoms similar to those associated with GAD. Your health care provider may refer you to a mental health specialist for further evaluation. TREATMENT The following therapies are usually used to treat GAD:   Medication. Antidepressant medication usually is prescribed for long-term daily control. Antianxiety  medicines may be added in severe cases, especially when panic attacks occur.   Talk therapy (psychotherapy). Certain types of talk therapy can be helpful in treating GAD by providing support, education, and guidance. A form of talk therapy called cognitive behavioral therapy can teach you healthy ways to think about and react to daily life events  and activities.  Stress managementtechniques. These include yoga, meditation, and exercise and can be very helpful when they are practiced regularly. A mental health specialist can help determine which treatment is best for you. Some people see improvement with one therapy. However, other people require a combination of therapies. This information is not intended to replace advice given to you by your health care provider. Make sure you discuss any questions you have with your health care provider. Document Released: 11/07/2012 Document Revised: 08/03/2014 Document Reviewed: 11/07/2012 Elsevier Interactive Patient Education  2017 [employer].

## 2016-09-14 NOTE — Progress Notes (Signed)
   [facility] Visit  Patient name: Ruth Dunlap MRN [id number]  Date of birth: 1995/10/31  CC & HPI:  Ruth Dunlap is a 21 y.o. [phone] African American female 12wks pp, presenting today for report of depression/anxiety that began out of the blue ~2wks ago. 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 Has been taking Genexa from [facility] for anxiety which she states helps, but feels she needs Lexapro as well. Denies SI/HI/II. Not eating or sleeping well, no joy in things she used to find joy in. Bottlefeeding.  Patient's last menstrual period was 08/31/2016. The current method of family planning is OCP (estrogen/progesterone). Last pap Jan 2017 @ [facility], neg  Pertinent History Reviewed:  Medical & Surgical Hx:   Past medical, surgical, family, and social history reviewed in electronic medical record Medications: Reviewed & Updated - see associated section Allergies: Reviewed in electronic medical record  Objective Findings:  Vitals: BP 118/72 (BP Location: Right Arm, Patient Position: Sitting, Cuff Size: Normal)   Pulse 72   Ht 5\' 6"  (1.676 m)   Wt 164 lb (74.4 kg)   LMP 08/31/2016   Breastfeeding? No   BMI 26.47 kg/m  Body mass index is 26.47 kg/m.  Physical Examination: General appearance - alert, well appearing, and in no distress  No results found for this or any previous visit (from the past 24 hour(s)).   Assessment & Plan:  A:   12wks s/p SVB  PPD/anxiety  P:  Rx Lexapro 10mg  daily, understands can take a few weeks until starts to notice improvement  Referral to [facility] sent today, let us know or call them if hasn't heard from them w/in 1wk  OK to continue Genexa as well if needed  Return in about 4 weeks (around 10/12/2016) for F/U.  Tawnya Crook CNM, [facility] 09/14/2016 12:10 PM

## 2016-10-12 ENCOUNTER — Ambulatory Visit: Payer: Medicaid Other | Admitting: Women's Health

## 2016-11-01 ENCOUNTER — Emergency Department (HOSPITAL_COMMUNITY)
Admission: EM | Admit: 2016-11-01 | Discharge: 2016-11-01 | Disposition: A | Discharge status: Home / Self Care | Payer: Medicaid Other | Attending: Emergency Medicine | Admitting: Emergency Medicine

## 2016-11-01 ENCOUNTER — Encounter (HOSPITAL_COMMUNITY): Payer: Self-pay | Admitting: Emergency Medicine

## 2016-11-01 DIAGNOSIS — Z79899 Other long term (current) drug therapy: Secondary | ICD-10-CM | POA: Insufficient documentation

## 2016-11-01 DIAGNOSIS — M62830 Muscle spasm of back: Secondary | ICD-10-CM | POA: Diagnosis not present

## 2016-11-01 DIAGNOSIS — J069 Acute upper respiratory infection, unspecified: Secondary | ICD-10-CM | POA: Insufficient documentation

## 2016-11-01 DIAGNOSIS — M549 Dorsalgia, unspecified: Secondary | ICD-10-CM | POA: Diagnosis present

## 2016-11-01 MED ORDER — METHOCARBAMOL 500 MG PO TABS: 500.0000 mg | ORAL_TABLET | Freq: Two times a day (BID) | ORAL | 0 refills | Status: DC

## 2016-11-01 NOTE — Discharge Instructions (Signed)
We suspect that your upper back pain is most likely from muscular spasms and cramps. This may be due to you holding your baby which can cause muscle tightness as well as spasms. I suspect you also have a viral upper respiratory illness given your cough, headache, runny nose. Repetitive coughing can also exacerbate muscle soreness in your upper back. We will treat your symptoms with a muscle relaxer today which will loosen up her back muscles and relieve spasms. Take up to 650 mg of tylenol every 8 hours to decrease the pain.  Light massage will also help loosen up your muscle spasms and tension.   There is a very small risk that your symptoms may be due to a blood clot in your lungs. We think that you have a very low chance of this. At this time we do not recommend further workup for this. Please return to the emergency department if you notice shortness of breath, chest pain with taking a deep breath or coughing, increased work of breathing.  Please read the attached information on pulmonary embolism so you may learn the signs and symptoms to monitor for that would warrant return to the emergency department for further evaluation.

## 2016-11-01 NOTE — ED Triage Notes (Addendum)
Patient c/o upper back pain that started yesterday. Denies any known injury. Per patient intermittent sharp pain. Patient denies taking anything for pain. Denies any numbness of tingling. CNS intact.
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
## 2016-11-03 ENCOUNTER — Emergency Department (HOSPITAL_COMMUNITY)
Admission: EM | Admit: 2016-11-03 | Discharge: 2016-11-03 | Disposition: A | Discharge status: Home / Self Care | Payer: Medicaid Other | Attending: Emergency Medicine | Admitting: Emergency Medicine

## 2016-11-03 ENCOUNTER — Encounter (HOSPITAL_COMMUNITY): Payer: Self-pay | Admitting: *Deleted

## 2016-11-03 DIAGNOSIS — G44209 Tension-type headache, unspecified, not intractable: Secondary | ICD-10-CM | POA: Insufficient documentation

## 2016-11-03 DIAGNOSIS — F410 Panic disorder [episodic paroxysmal anxiety] without agoraphobia: Secondary | ICD-10-CM | POA: Diagnosis not present

## 2016-11-03 DIAGNOSIS — Z79899 Other long term (current) drug therapy: Secondary | ICD-10-CM | POA: Insufficient documentation

## 2016-11-03 DIAGNOSIS — M940 Chondrocostal junction syndrome [Tietze]: Secondary | ICD-10-CM | POA: Diagnosis not present

## 2016-11-03 DIAGNOSIS — R51 Headache: Secondary | ICD-10-CM | POA: Diagnosis present

## 2016-11-03 LAB — I-STAT TROPONIN, ED: TROPONIN I, POC: 0 ng/mL (ref 0.00–0.08)

## 2016-11-03 MED ORDER — ONDANSETRON 8 MG PO TBDP
8.0000 mg | ORAL_TABLET | Freq: Once | ORAL | Status: AC
  Administered 2016-11-03: 8 mg via ORAL
  Filled 2016-11-03: qty 1

## 2016-11-03 MED ORDER — NAPROXEN 250 MG PO TABS
500.0000 mg | ORAL_TABLET | Freq: Once | ORAL | Status: AC
  Administered 2016-11-03: 500 mg via ORAL
  Filled 2016-11-03: qty 2

## 2016-11-03 NOTE — ED Triage Notes (Signed)
Pt c/o headache with some chest pain and right arm numbness and difficulty swallowing x 1 week
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
## 2016-11-03 NOTE — Discharge Instructions (Signed)
Use ice and heat for comfort. Take ibuprofen 600 mg 4 times a day OR aleve 2 tabs twice a day for pain. Recheck if you get a fever, cough, struggle to breathe. Keep your appointments with [facility].

## 2016-11-05 ENCOUNTER — Encounter: Payer: Self-pay | Admitting: Women's Health

## 2016-11-05 ENCOUNTER — Other Ambulatory Visit (HOSPITAL_COMMUNITY)
Admission: RE | Admit: 2016-11-05 | Discharge: 2016-11-05 | Disposition: A | Discharge status: Home / Self Care | Payer: Medicaid Other | Source: Ambulatory Visit | Attending: Obstetrics & Gynecology | Admitting: Obstetrics & Gynecology

## 2016-11-05 ENCOUNTER — Ambulatory Visit (INDEPENDENT_AMBULATORY_CARE_PROVIDER_SITE_OTHER): Payer: Medicaid Other | Admitting: Women's Health

## 2016-11-05 VITALS — BP 110/60 | HR 80 | Ht 66.0 in | Wt 159.4 lb

## 2016-11-05 DIAGNOSIS — Z01419 Encounter for gynecological examination (general) (routine) without abnormal findings: Secondary | ICD-10-CM | POA: Insufficient documentation

## 2016-11-05 DIAGNOSIS — Z113 Encounter for screening for infections with a predominantly sexual mode of transmission: Secondary | ICD-10-CM

## 2016-11-05 DIAGNOSIS — Z0000 Encounter for general adult medical examination without abnormal findings: Secondary | ICD-10-CM
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
## 2016-11-05 NOTE — Addendum Note (Signed)
Addended by: Diona Fanti A on: 11/05/2016 03:15 PM   Modules accepted: Orders

## 2016-11-06 LAB — RPR: RPR: NONREACTIVE

## 2016-11-06 LAB — HEPATITIS B SURFACE ANTIGEN: Hepatitis B Surface Ag: NEGATIVE

## 2016-11-06 LAB — HIV ANTIBODY (ROUTINE TESTING W REFLEX): HIV Screen 4th Generation wRfx: NONREACTIVE

## 2016-11-11 LAB — CYTOLOGY - PAP
Chlamydia: POSITIVE — AB
Diagnosis: UNDETERMINED — AB
HPV: DETECTED — AB
NEISSERIA GONORRHEA: NEGATIVE

## 2016-11-13 ENCOUNTER — Other Ambulatory Visit: Payer: Self-pay | Admitting: Women's Health

## 2016-11-13 ENCOUNTER — Telehealth: Payer: Self-pay | Admitting: *Deleted

## 2016-11-13 DIAGNOSIS — R87619 Unspecified abnormal cytological findings in specimens from cervix uteri: Secondary | ICD-10-CM | POA: Insufficient documentation

## 2016-11-13 DIAGNOSIS — R87610 Atypical squamous cells of undetermined significance on cytologic smear of cervix (ASC-US): Secondary | ICD-10-CM

## 2016-11-13 DIAGNOSIS — A749 Chlamydial infection, unspecified: Secondary | ICD-10-CM

## 2016-11-13 MED ORDER — AZITHROMYCIN 500 MG PO TABS: 1000.0000 mg | ORAL_TABLET | Freq: Once | ORAL | 0 refills | Status: AC

## 2016-11-13 NOTE — Telephone Encounter (Signed)
Spoke with pt letting her know pap was abnormal and she needs a colpo. I explained what a colpo was. Pt also has CHL. Med was sent to pharmacy. I advised to get med today and take today. Pt's states her partner will go to health dept. I advised no sex at least 7 days after both were treated. Pt voiced understanding. Call transferred to front desk for colpo appt. Will do POC at that time. Pt voiced understanding. [place]

## 2016-12-14 ENCOUNTER — Encounter: Payer: Medicaid Other | Admitting: Obstetrics and Gynecology

## 2016-12-25 DIAGNOSIS — O009 Unspecified ectopic pregnancy without intrauterine pregnancy: Secondary | ICD-10-CM

## 2016-12-25 HISTORY — DX: Unspecified ectopic pregnancy without intrauterine pregnancy: O00.90

## 2016-12-30 ENCOUNTER — Encounter: Payer: Self-pay | Admitting: Obstetrics and Gynecology

## 2016-12-30 ENCOUNTER — Ambulatory Visit (INDEPENDENT_AMBULATORY_CARE_PROVIDER_SITE_OTHER): Payer: Medicaid Other | Admitting: Obstetrics and Gynecology

## 2016-12-30 ENCOUNTER — Encounter (HOSPITAL_COMMUNITY): Payer: Self-pay | Admitting: *Deleted

## 2016-12-30 ENCOUNTER — Emergency Department (HOSPITAL_COMMUNITY)
Admission: EM | Admit: 2016-12-30 | Discharge: 2016-12-30 | Disposition: A | Discharge status: Home / Self Care | Payer: Medicaid Other | Attending: Emergency Medicine | Admitting: Emergency Medicine

## 2016-12-30 ENCOUNTER — Emergency Department (HOSPITAL_COMMUNITY): Payer: Medicaid Other

## 2016-12-30 ENCOUNTER — Other Ambulatory Visit: Payer: Self-pay | Admitting: Obstetrics and Gynecology

## 2016-12-30 VITALS — BP 110/60 | HR 80 | Ht 66.0 in | Wt 159.6 lb

## 2016-12-30 DIAGNOSIS — R197 Diarrhea, unspecified: Secondary | ICD-10-CM | POA: Diagnosis not present

## 2016-12-30 DIAGNOSIS — R87610 Atypical squamous cells of undetermined significance on cytologic smear of cervix (ASC-US): Secondary | ICD-10-CM

## 2016-12-30 DIAGNOSIS — R87810 Cervical high risk human papillomavirus (HPV) DNA test positive: Secondary | ICD-10-CM

## 2016-12-30 DIAGNOSIS — Z202 Contact with and (suspected) exposure to infections with a predominantly sexual mode of transmission: Secondary | ICD-10-CM

## 2016-12-30 DIAGNOSIS — R112 Nausea with vomiting, unspecified: Secondary | ICD-10-CM | POA: Diagnosis not present

## 2016-12-30 DIAGNOSIS — Z3202 Encounter for pregnancy test, result negative: Secondary | ICD-10-CM

## 2016-12-30 DIAGNOSIS — R1030 Lower abdominal pain, unspecified: Secondary | ICD-10-CM | POA: Diagnosis not present

## 2016-12-30 DIAGNOSIS — R87619 Unspecified abnormal cytological findings in specimens from cervix uteri: Secondary | ICD-10-CM

## 2016-12-30 LAB — URINALYSIS, ROUTINE W REFLEX MICROSCOPIC
BILIRUBIN URINE: NEGATIVE
Bacteria, UA: NONE SEEN
Glucose, UA: NEGATIVE mg/dL
Ketones, ur: NEGATIVE mg/dL
LEUKOCYTES UA: NEGATIVE
Nitrite: NEGATIVE
PROTEIN: 30 mg/dL — AB
Specific Gravity, Urine: 1.033 — ABNORMAL HIGH (ref 1.005–1.030)
pH: 5 (ref 5.0–8.0)

## 2016-12-30 LAB — COMPREHENSIVE METABOLIC PANEL
ALT: 16 U/L (ref 14–54)
AST: 21 U/L (ref 15–41)
Albumin: 4.3 g/dL (ref 3.5–5.0)
Alkaline Phosphatase: 48 U/L (ref 38–126)
Anion gap: 8 (ref 5–15)
BUN: 18 mg/dL (ref 6–20)
CHLORIDE: 104 mmol/L (ref 101–111)
CO2: 24 mmol/L (ref 22–32)
Calcium: 9.2 mg/dL (ref 8.9–10.3)
Creatinine, Ser: 0.76 mg/dL (ref 0.44–1.00)
GFR calc Af Amer: >60 mL/min (ref >60)
Glucose, Bld: 123 mg/dL — ABNORMAL HIGH (ref 65–99)
POTASSIUM: 3.8 mmol/L (ref 3.5–5.1)
Sodium: 136 mmol/L (ref 135–145)
Total Bilirubin: 1.3 mg/dL — ABNORMAL HIGH (ref 0.3–1.2)
Total Protein: 8.2 g/dL — ABNORMAL HIGH (ref 6.5–8.1)

## 2016-12-30 LAB — CBC
HEMATOCRIT: 40.6 % (ref 36.0–46.0)
HEMOGLOBIN: 13.4 g/dL (ref 12.0–15.0)
MCH: 26.4 pg (ref 26.0–34.0)
MCHC: 33 g/dL (ref 30.0–36.0)
MCV: 80.1 fL (ref 78.0–100.0)
Platelets: 241 10*3/uL (ref 150–400)
RBC: 5.07 MIL/uL (ref 3.87–5.11)
RDW: 14 % (ref 11.5–15.5)
WBC: 6.4 10*3/uL (ref 4.0–10.5)

## 2016-12-30 LAB — LIPASE, BLOOD: LIPASE: 19 U/L (ref 11–51)

## 2016-12-30 LAB — PREGNANCY, URINE: PREG TEST UR: NEGATIVE

## 2016-12-30 MED ORDER — SODIUM CHLORIDE 0.9 % IV BOLUS (SEPSIS)
1000.0000 mL | Freq: Once | INTRAVENOUS | Status: AC
  Administered 2016-12-30: 1000 mL via INTRAVENOUS

## 2016-12-30 MED ORDER — IOPAMIDOL (ISOVUE-300) INJECTION 61%
100.0000 mL | Freq: Once | INTRAVENOUS | Status: AC | PRN
  Administered 2016-12-30: 100 mL via INTRAVENOUS

## 2016-12-30 MED ORDER — PROMETHAZINE HCL 25 MG PO TABS: 25.0000 mg | ORAL_TABLET | Freq: Four times a day (QID) | ORAL | 0 refills | Status: DC | PRN

## 2016-12-30 MED ORDER — PROMETHAZINE HCL 25 MG/ML IJ SOLN
12.5000 mg | Freq: Once | INTRAMUSCULAR | Status: AC
  Administered 2016-12-30: 12.5 mg via INTRAVENOUS
  Filled 2016-12-30: qty 1

## 2016-12-30 MED ORDER — MORPHINE SULFATE (PF) 4 MG/ML IV SOLN
4.0000 mg | Freq: Once | INTRAVENOUS | Status: AC
  Administered 2016-12-30: 4 mg via INTRAVENOUS
  Filled 2016-12-30: qty 1

## 2016-12-30 MED ORDER — ONDANSETRON HCL 4 MG/2ML IJ SOLN
4.0000 mg | Freq: Once | INTRAMUSCULAR | Status: AC
  Administered 2016-12-30: 4 mg via INTRAVENOUS
  Filled 2016-12-30: qty 2

## 2016-12-30 NOTE — Progress Notes (Signed)
Patient ID: Ruth Dunlap, female   DOB: Apr 17, 1996, 21 y.o.   MRN: [id number]  Chief Complaint  Patient presents with   Colposcopy    POC for chlamydia     Colposcopy Procedure Note  [name] 21 y.o. [id number] here for colposcopy for ASCUS with POSITIVE high risk HPV pap smear on 11/05/16. Pt also requires POC for +CHL 11/05/16, for which she and her partner (to her knowledge) have been treated. GC/CHL collected prior to colpo.   Discussion: Discussed with pt progression of abnormal pap smear results. 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 Pt advised that colposcopy is typically only indicated with ASCUS and LSIL with +HPV. 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 Pt advised that it typically takes 5 years or more to go from high grade abnormality to cervical cancer.   At end of discussion, pt had opportunity to ask questions and has no further questions at this time.   Specific discussion of HPV as noted above. Greater than 50% was spent in counseling and coordination of care with the patient.   Total time greater than: 15 minutes.     Discussed role for HPV in cervical dysplasia, need for surveillance.  Patient given informed consent, signed copy in the chart, time out was performed.  Placed in lithotomy position. Cervix viewed with speculum and colposcope after application of acetic acid.   Colposcopy adequate? Yes  a wide narrow everted acetowhite lesion(s) noted at 6 ato 8 o'clock; biopsied  Cervix partially anesthetized with hurricane spray. Biopsies obtained at 6-8 o'clock.   ECC specimen obtained. Hemostasis achieved with AgNO3 and Monsel's solution.  All specimens were labelled and sent to pathology.   Colposcopy IMPRESSION: CIN-1  Patient was given post procedure instructions. Will follow up pathology and manage accordingly.  Routine  preventative health maintenance measures emphasized.  Plan: F/u in 1w for recheck of biopsy site   By signing my name below, I, Hansel Feinstein, attest that this documentation has been prepared under the direction and in the presence of Jonnie Kind, MD. Electronically Signed: Hansel Feinstein, ED Scribe. 12/30/16. 12:04 PM.  jfs I personally performed the services described in this documentation, which was SCRIBED in my presence. The recorded information has been reviewed and considered accurate. It has been edited as necessary during review. Jonnie Kind, MD
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
## 2016-12-30 NOTE — Discharge Instructions (Signed)
Small amts of clear fluids today, then bland diet as tolerated.  Follow-up with your primary doctor for recheck or return here for any worsening symptoms

## 2016-12-30 NOTE — ED Triage Notes (Signed)
Pt comes in for abdominal pain starting last night. She has n/v/d as well. Denies any urinary problems.

## 2017-01-03 LAB — GC/CHLAMYDIA PROBE AMP
CHLAMYDIA, DNA PROBE: NEGATIVE
NEISSERIA GONORRHOEAE BY PCR: NEGATIVE

## 2017-01-06 ENCOUNTER — Encounter: Payer: Self-pay | Admitting: Obstetrics and Gynecology

## 2017-01-06 ENCOUNTER — Ambulatory Visit (INDEPENDENT_AMBULATORY_CARE_PROVIDER_SITE_OTHER): Payer: Medicaid Other | Admitting: Obstetrics and Gynecology

## 2017-01-06 VITALS — BP 114/68 | HR 84 | Wt 164.0 lb

## 2017-01-06 DIAGNOSIS — N871 Moderate cervical dysplasia: Secondary | ICD-10-CM
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
## 2017-01-20 ENCOUNTER — Emergency Department (HOSPITAL_COMMUNITY): Payer: Medicaid Other | Admitting: Anesthesiology

## 2017-01-20 ENCOUNTER — Encounter: Payer: Self-pay | Admitting: Obstetrics and Gynecology

## 2017-01-20 ENCOUNTER — Encounter (HOSPITAL_COMMUNITY): Payer: Self-pay | Admitting: Emergency Medicine

## 2017-01-20 ENCOUNTER — Encounter (HOSPITAL_COMMUNITY)
Admission: EM | Disposition: A | Discharge status: Home / Self Care | Payer: Self-pay | Source: Home / Self Care | Attending: Obstetrics & Gynecology

## 2017-01-20 ENCOUNTER — Ambulatory Visit (INDEPENDENT_AMBULATORY_CARE_PROVIDER_SITE_OTHER): Payer: Medicaid Other | Admitting: Obstetrics and Gynecology

## 2017-01-20 ENCOUNTER — Inpatient Hospital Stay (HOSPITAL_COMMUNITY)
Admission: EM | Admit: 2017-01-20 | Discharge: 2017-01-22 | DRG: 777 | Disposition: A | Discharge status: Home / Self Care | Payer: Medicaid Other | Attending: Obstetrics & Gynecology | Admitting: Obstetrics & Gynecology

## 2017-01-20 ENCOUNTER — Emergency Department (HOSPITAL_COMMUNITY): Payer: Medicaid Other

## 2017-01-20 ENCOUNTER — Other Ambulatory Visit (HOSPITAL_COMMUNITY): Payer: Self-pay

## 2017-01-20 VITALS — BP 112/70 | HR 100 | Wt 164.2 lb

## 2017-01-20 DIAGNOSIS — Z3201 Encounter for pregnancy test, result positive: Secondary | ICD-10-CM

## 2017-01-20 DIAGNOSIS — O2651 Maternal hypotension syndrome, first trimester: Secondary | ICD-10-CM | POA: Diagnosis present

## 2017-01-20 DIAGNOSIS — Z9889 Other specified postprocedural states: Secondary | ICD-10-CM

## 2017-01-20 DIAGNOSIS — O009 Unspecified ectopic pregnancy without intrauterine pregnancy: Secondary | ICD-10-CM | POA: Insufficient documentation

## 2017-01-20 DIAGNOSIS — O219 Vomiting of pregnancy, unspecified: Secondary | ICD-10-CM | POA: Diagnosis present

## 2017-01-20 DIAGNOSIS — Z3A01 Less than 8 weeks gestation of pregnancy: Secondary | ICD-10-CM

## 2017-01-20 DIAGNOSIS — R109 Unspecified abdominal pain: Secondary | ICD-10-CM

## 2017-01-20 DIAGNOSIS — O008 Other ectopic pregnancy without intrauterine pregnancy: Principal | ICD-10-CM

## 2017-01-20 DIAGNOSIS — K661 Hemoperitoneum: Secondary | ICD-10-CM | POA: Diagnosis present

## 2017-01-20 DIAGNOSIS — R52 Pain, unspecified: Secondary | ICD-10-CM

## 2017-01-20 DIAGNOSIS — Z888 Allergy status to other drugs, medicaments and biological substances status: Secondary | ICD-10-CM

## 2017-01-20 HISTORY — PX: DIAGNOSTIC LAPAROSCOPY WITH REMOVAL OF ECTOPIC PREGNANCY: SHX6449

## 2017-01-20 HISTORY — PX: LAPAROTOMY: SHX154

## 2017-01-20 LAB — CBC
HCT: 32.4 % — ABNORMAL LOW (ref 36.0–46.0)
Hemoglobin: 10.7 g/dL — ABNORMAL LOW (ref 12.0–15.0)
MCH: 26.4 pg (ref 26.0–34.0)
MCHC: 33 g/dL (ref 30.0–36.0)
MCV: 79.8 fL (ref 78.0–100.0)
PLATELETS: 276 10*3/uL (ref 150–400)
RBC: 4.06 MIL/uL (ref 3.87–5.11)
RDW: 13.7 % (ref 11.5–15.5)
WBC: 16.3 10*3/uL — AB (ref 4.0–10.5)

## 2017-01-20 LAB — COMPREHENSIVE METABOLIC PANEL
ALT: 12 U/L — AB (ref 14–54)
AST: 19 U/L (ref 15–41)
Albumin: 3.9 g/dL (ref 3.5–5.0)
Alkaline Phosphatase: 42 U/L (ref 38–126)
Anion gap: 10 (ref 5–15)
BILIRUBIN TOTAL: 1.1 mg/dL (ref 0.3–1.2)
BUN: 13 mg/dL (ref 6–20)
CO2: 19 mmol/L — ABNORMAL LOW (ref 22–32)
Calcium: 8.8 mg/dL — ABNORMAL LOW (ref 8.9–10.3)
Chloride: 104 mmol/L (ref 101–111)
Creatinine, Ser: 0.96 mg/dL (ref 0.44–1.00)
GFR calc Af Amer: >60 mL/min (ref >60)
Glucose, Bld: 164 mg/dL — ABNORMAL HIGH (ref 65–99)
Potassium: 3.9 mmol/L (ref 3.5–5.1)
Sodium: 133 mmol/L — ABNORMAL LOW (ref 135–145)
Total Protein: 7.1 g/dL (ref 6.5–8.1)

## 2017-01-20 LAB — PREPARE RBC (CROSSMATCH)

## 2017-01-20 LAB — LIPASE, BLOOD: Lipase: 20 U/L (ref 11–51)

## 2017-01-20 LAB — HCG, QUANTITATIVE, PREGNANCY: HCG, BETA CHAIN, QUANT, S: 4412 m[IU]/mL — AB (ref <5)

## 2017-01-20 LAB — PROTIME-INR
INR: 1.09
PROTHROMBIN TIME: 14.2 s (ref 11.4–15.2)

## 2017-01-20 LAB — I-STAT BETA HCG BLOOD, ED (MC, WL, AP ONLY): I-stat hCG, quantitative: >2000 m[IU]/mL — ABNORMAL HIGH (ref <5)

## 2017-01-20 LAB — POCT URINE PREGNANCY: Preg Test, Ur: POSITIVE — AB

## 2017-01-20 LAB — APTT: aPTT: 25 seconds (ref 24–36)

## 2017-01-20 LAB — ABO/RH: ABO/RH(D): AB POS

## 2017-01-20 SURGERY — LAPAROSCOPY, WITH ECTOPIC PREGNANCY SURGICAL TREATMENT
Anesthesia: General | Site: Abdomen

## 2017-01-20 MED ORDER — PHENYLEPHRINE HCL 10 MG/ML IJ SOLN
INTRAMUSCULAR | Status: DC | PRN
  Administered 2017-01-20 (×2): 80 ug via INTRAVENOUS

## 2017-01-20 MED ORDER — ONDANSETRON HCL 4 MG/2ML IJ SOLN
4.0000 mg | Freq: Once | INTRAMUSCULAR | Status: AC
  Administered 2017-01-20: 4 mg via INTRAVENOUS
  Filled 2017-01-20: qty 2

## 2017-01-20 MED ORDER — PROPOFOL 10 MG/ML IV BOLUS
INTRAVENOUS | Status: DC | PRN
Start: 2017-01-20 — End: 2017-01-21
  Administered 2017-01-20: 100 mg via INTRAVENOUS

## 2017-01-20 MED ORDER — LACTATED RINGERS IV SOLN
INTRAVENOUS | Status: DC | PRN
  Administered 2017-01-20 – 2017-01-21 (×2): via INTRAVENOUS

## 2017-01-20 MED ORDER — SODIUM CHLORIDE 0.9 % IR SOLN
Status: DC | PRN
  Administered 2017-01-20 (×2): 1000 mL

## 2017-01-20 MED ORDER — CEFAZOLIN (ANCEF) 1 G IV SOLR: 2.0000 g | INTRAVENOUS | Status: DC

## 2017-01-20 MED ORDER — FENTANYL CITRATE (PF) 100 MCG/2ML IJ SOLN
50.0000 ug | Freq: Once | INTRAMUSCULAR | Status: AC
  Administered 2017-01-20: 50 ug via INTRAVENOUS
  Filled 2017-01-20: qty 2

## 2017-01-20 MED ORDER — LIDOCAINE HCL (CARDIAC) 10 MG/ML IV SOLN
INTRAVENOUS | Status: DC | PRN
  Administered 2017-01-20 (×2): 40 mg via INTRAVENOUS

## 2017-01-20 MED ORDER — MORPHINE SULFATE (PF) 4 MG/ML IV SOLN
4.0000 mg | Freq: Once | INTRAVENOUS | Status: DC
  Filled 2017-01-20: qty 1

## 2017-01-20 MED ORDER — ONDANSETRON HCL 4 MG/2ML IJ SOLN
INTRAMUSCULAR | Status: DC | PRN
  Administered 2017-01-20: 4 mg via INTRAVENOUS

## 2017-01-20 MED ORDER — EPHEDRINE SULFATE 50 MG/ML IJ SOLN
INTRAMUSCULAR | Status: DC | PRN
  Administered 2017-01-20: 10 mg via INTRAVENOUS

## 2017-01-20 MED ORDER — BUPIVACAINE LIPOSOME 1.3 % IJ SUSP
INTRAMUSCULAR | Status: AC
  Filled 2017-01-20: qty 20

## 2017-01-20 MED ORDER — SUCCINYLCHOLINE 20MG/ML (10ML) SYRINGE FOR MEDFUSION PUMP - OPTIME
INTRAMUSCULAR | Status: DC | PRN
  Administered 2017-01-20: 100 mg via INTRAVENOUS

## 2017-01-20 MED ORDER — ROCURONIUM 10MG/ML (10ML) SYRINGE FOR MEDFUSION PUMP - OPTIME
INTRAVENOUS | Status: DC | PRN
  Administered 2017-01-20: 10 mg via INTRAVENOUS
  Administered 2017-01-20: 25 mg via INTRAVENOUS
  Administered 2017-01-20: 10 mg via INTRAVENOUS
  Administered 2017-01-20: 5 mg via INTRAVENOUS

## 2017-01-20 MED ORDER — SODIUM CHLORIDE 0.9 % IV BOLUS (SEPSIS)
1000.0000 mL | Freq: Once | INTRAVENOUS | Status: AC
  Administered 2017-01-20: 1000 mL via INTRAVENOUS

## 2017-01-20 MED ORDER — FENTANYL CITRATE (PF) 100 MCG/2ML IJ SOLN
INTRAMUSCULAR | Status: DC | PRN
  Administered 2017-01-20 – 2017-01-21 (×6): 50 ug via INTRAVENOUS

## 2017-01-20 MED ORDER — CEFAZOLIN SODIUM-DEXTROSE 2-4 GM/100ML-% IV SOLN
2.0000 g | INTRAVENOUS | Status: AC
  Administered 2017-01-20: 2 g via INTRAVENOUS
  Filled 2017-01-20 (×2): qty 100

## 2017-01-20 MED ORDER — MIDAZOLAM HCL 5 MG/5ML IJ SOLN
INTRAMUSCULAR | Status: DC | PRN
  Administered 2017-01-20: 2 mg via INTRAVENOUS

## 2017-01-20 MED ORDER — KETOROLAC TROMETHAMINE 30 MG/ML IJ SOLN
30.0000 mg | Freq: Once | INTRAMUSCULAR | Status: AC
  Administered 2017-01-20: 30 mg via INTRAVENOUS
  Filled 2017-01-20: qty 1

## 2017-01-20 MED ORDER — SODIUM CHLORIDE 0.9 % IV SOLN
Freq: Once | INTRAVENOUS | Status: AC
  Administered 2017-01-20: via INTRAVENOUS

## 2017-01-20 SURGICAL SUPPLY — 61 items
APPLIER CLIP LAPSCP 10X32 DD (CLIP) IMPLANT
BAG HAMPER (MISCELLANEOUS) ×4 IMPLANT
BAG RETRIEVAL 10 (BASKET) ×1
BAG RETRIEVAL 10MM (BASKET) ×1
BLADE 11 SAFETY STRL DISP (BLADE) ×4 IMPLANT
CELLS DAT CNTRL 66122 CELL SVR (MISCELLANEOUS) ×2 IMPLANT
CLOTH BEACON ORANGE TIMEOUT ST (SAFETY) ×4 IMPLANT
COVER LIGHT HANDLE STERIS (MISCELLANEOUS) ×8 IMPLANT
DRAPE PROXIMA HALF (DRAPES) ×4 IMPLANT
DRSG OPSITE POSTOP 4X10 (GAUZE/BANDAGES/DRESSINGS) ×2 IMPLANT
ELECT REM PT RETURN 9FT ADLT (ELECTROSURGICAL) ×4
ELECTRODE REM PT RTRN 9FT ADLT (ELECTROSURGICAL) ×2 IMPLANT
FILTER SMOKE EVAC LAPAROSHD (FILTER) ×4 IMPLANT
FORMALIN 10 PREFIL 480ML (MISCELLANEOUS) ×4 IMPLANT
GLOVE BIOGEL PI IND STRL 7.0 (GLOVE) ×2 IMPLANT
GLOVE BIOGEL PI IND STRL 8 (GLOVE) ×2 IMPLANT
GLOVE BIOGEL PI INDICATOR 7.0 (GLOVE) ×2
GLOVE BIOGEL PI INDICATOR 8 (GLOVE) ×2
GLOVE ECLIPSE 8.0 STRL XLNG CF (GLOVE) ×4 IMPLANT
GOWN STRL REUS W/TWL LRG LVL3 (GOWN DISPOSABLE) ×4 IMPLANT
GOWN STRL REUS W/TWL XL LVL3 (GOWN DISPOSABLE) ×4 IMPLANT
INST SET LAPROSCOPIC GYN AP (KITS) ×4 IMPLANT
IV NS IRRIG 3000ML ARTHROMATIC (IV SOLUTION) ×4 IMPLANT
KIT ROOM TURNOVER APOR (KITS) ×4 IMPLANT
MANIFOLD NEPTUNE II (INSTRUMENTS) ×4 IMPLANT
NDL HYPO 18GX1.5 BLUNT FILL (NEEDLE) ×2 IMPLANT
NDL HYPO 25X1 1.5 SAFETY (NEEDLE) ×2 IMPLANT
NEEDLE HYPO 18GX1.5 BLUNT FILL (NEEDLE) ×4 IMPLANT
NEEDLE HYPO 25X1 1.5 SAFETY (NEEDLE) ×4 IMPLANT
NS IRRIG 1000ML POUR BTL (IV SOLUTION) ×4 IMPLANT
PACK PERI GYN (CUSTOM PROCEDURE TRAY) ×4 IMPLANT
PAD ARMBOARD 7.5X6 YLW CONV (MISCELLANEOUS) ×4 IMPLANT
RETRACTOR WND ALEXIS 18 MED (MISCELLANEOUS) IMPLANT
RTRCTR WOUND ALEXIS 18CM MED (MISCELLANEOUS) ×4
SET BASIN LINEN APH (SET/KITS/TRAYS/PACK) ×4 IMPLANT
SET TUBE IRRIG SUCTION NO TIP (IRRIGATION / IRRIGATOR) ×4 IMPLANT
SHEARS HARMONIC ACE PLUS 36CM (ENDOMECHANICALS) ×4 IMPLANT
SLEEVE ENDOPATH XCEL 5M (ENDOMECHANICALS) ×4 IMPLANT
SOLUTION ANTI FOG 6CC (MISCELLANEOUS) ×4 IMPLANT
SPONGE GAUZE 2X2 8PLY STER LF (GAUZE/BANDAGES/DRESSINGS) ×1
SPONGE GAUZE 2X2 8PLY STRL LF (GAUZE/BANDAGES/DRESSINGS) ×1 IMPLANT
SPONGE LAP 18X18 X RAY DECT (DISPOSABLE) ×4 IMPLANT
STAPLER VISISTAT 35W (STAPLE) ×4 IMPLANT
SUT MON AB 0 CT1 (SUTURE) ×8 IMPLANT
SUT VICRYL 0 UR6 27IN ABS (SUTURE) ×4 IMPLANT
SYR 20CC LL (SYRINGE) ×4 IMPLANT
SYR BULB IRRIGATION 50ML (SYRINGE) ×2 IMPLANT
SYRINGE 10CC LL (SYRINGE) ×4 IMPLANT
SYS BAG RETRIEVAL 10MM (BASKET) ×2
SYSTEM BAG RETRIEVAL 10MM (BASKET) IMPLANT
TAPE CLOTH SURG 4X10 WHT LF (GAUZE/BANDAGES/DRESSINGS) ×2 IMPLANT
TAPE SURG TRANSPORE 1 IN (GAUZE/BANDAGES/DRESSINGS) IMPLANT
TAPE SURGICAL TRANSPORE 1 IN (GAUZE/BANDAGES/DRESSINGS) ×2
TRAY FOLEY CATH SILVER 16FR (SET/KITS/TRAYS/PACK) ×4 IMPLANT
TRAY FOLEY W/METER SILVER 16FR (SET/KITS/TRAYS/PACK) ×2 IMPLANT
TROCAR ENDO BLADELESS 11MM (ENDOMECHANICALS) ×4 IMPLANT
TROCAR XCEL NON-BLD 5MMX100MML (ENDOMECHANICALS) ×4 IMPLANT
TUBING INSUF HEATED (TUBING) ×4 IMPLANT
WARMER LAPAROSCOPE (MISCELLANEOUS) ×4 IMPLANT
YANKAUER SUCT BULB TIP 10FT TU (MISCELLANEOUS) ×2 IMPLANT
YANKAUER SUCT BULB TIP NO VENT (SUCTIONS) ×2 IMPLANT
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
## 2017-01-20 NOTE — ED Triage Notes (Signed)
Pt C/O lower abd pain that started 1 hr ago. Pt C/O N/V and dizziness.

## 2017-01-20 NOTE — Anesthesia Preprocedure Evaluation (Signed)
Anesthesia Evaluation  Patient identified by MRN, date of birth, ID band Patient awake  General Assessment Comment:No beta blocker  Reviewed: Allergy & Precautions, NPO status , Patient's Chart, lab work & pertinent test results  Airway Mallampati: I  TM Distance: >3 FB Neck ROM: Full    Dental  (+) Teeth Intact   Pulmonary neg pulmonary ROS,    breath sounds clear to auscultation       Cardiovascular negative cardio ROS   Rhythm:Regular Rate:Normal     Neuro/Psych    GI/Hepatic negative GI ROS, Neg liver ROS,   Endo/Other  negative endocrine ROS  Renal/GU negative Renal ROS     Musculoskeletal   Abdominal   Peds  Hematology negative hematology ROS (+)   Anesthesia Other Findings   Reproductive/Obstetrics                             Anesthesia Physical Anesthesia Plan  ASA: I and emergent  Anesthesia Plan: General   Post-op Pain Management:    Induction: Intravenous, Rapid sequence and Cricoid pressure planned  PONV Risk Score and Plan:   Airway Management Planned: Oral ETT  Additional Equipment:   Intra-op Plan:   Post-operative Plan: Extubation in OR  Informed Consent:   Plan Discussed with: Surgeon  Anesthesia Plan Comments:         Anesthesia Quick Evaluation

## 2017-01-20 NOTE — Progress Notes (Addendum)
Unable to perform LEEP procedure today as a result of + pregnancy test. Patient states she will seek pregnancy termination, given local resources.  Plans for pregnancy, options, resources reviewed with pt and mother. Laws regarding serilization , and Medicaid rules reviewed. LARC options reviewed.  The provider spent over 15 minutes with the visit, including pre visit review, documentation, with >than 50% spent in counseling and coordination of care.  By signing my name below, I, Sonum Patel, attest that this documentation has been prepared under the direction and in the presence of Jonnie Kind, MD. Electronically Signed: Sonum Patel, Scribe. 01/20/17. 3:11 PM.  I personally performed the services described in this documentation, which was SCRIBED in my presence. The recorded information has been reviewed and considered accurate. It has been edited as necessary during review. Jonnie Kind, MD

## 2017-01-20 NOTE — Anesthesia Procedure Notes (Signed)
Procedure Name: Intubation Date/Time: 01/20/2017 11:03 PM Performed by: Tressie Stalker E Pre-anesthesia Checklist: Patient identified, Patient being monitored, Timeout performed, Emergency Drugs available and Suction available Patient Re-evaluated:Patient Re-evaluated prior to inductionOxygen Delivery Method: Circle system utilized Preoxygenation: Pre-oxygenation with 100% oxygen Intubation Type: IV induction, Rapid sequence and Cricoid Pressure applied Ventilation: Mask ventilation without difficulty Laryngoscope Size: Mac and 3 Grade View: Grade I Tube type: Oral Tube size: 7.0 mm Number of attempts: 1 Airway Equipment and Method: Stylet Placement Confirmation: ETT inserted through vocal cords under direct vision,  positive ETCO2 and breath sounds checked- equal and bilateral Secured at: 21 cm Tube secured with: Tape Dental Injury: Teeth and Oropharynx as per pre-operative assessment
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
## 2017-01-21 DIAGNOSIS — O008 Other ectopic pregnancy without intrauterine pregnancy: Secondary | ICD-10-CM | POA: Diagnosis not present

## 2017-01-21 DIAGNOSIS — Z888 Allergy status to other drugs, medicaments and biological substances status: Secondary | ICD-10-CM | POA: Diagnosis not present

## 2017-01-21 DIAGNOSIS — O219 Vomiting of pregnancy, unspecified: Secondary | ICD-10-CM | POA: Diagnosis not present

## 2017-01-21 DIAGNOSIS — K661 Hemoperitoneum: Secondary | ICD-10-CM | POA: Diagnosis not present

## 2017-01-21 DIAGNOSIS — R109 Unspecified abdominal pain: Secondary | ICD-10-CM | POA: Diagnosis present

## 2017-01-21 DIAGNOSIS — O2651 Maternal hypotension syndrome, first trimester: Secondary | ICD-10-CM | POA: Diagnosis not present

## 2017-01-21 DIAGNOSIS — Z3A01 Less than 8 weeks gestation of pregnancy: Secondary | ICD-10-CM | POA: Diagnosis not present

## 2017-01-21 DIAGNOSIS — Z9889 Other specified postprocedural states: Secondary | ICD-10-CM

## 2017-01-21 LAB — CBC
HCT: 27.2 % — ABNORMAL LOW (ref 36.0–46.0)
Hemoglobin: 9.2 g/dL — ABNORMAL LOW (ref 12.0–15.0)
MCH: 26.7 pg (ref 26.0–34.0)
MCHC: 33.8 g/dL (ref 30.0–36.0)
MCV: 78.8 fL (ref 78.0–100.0)
PLATELETS: 153 10*3/uL (ref 150–400)
RBC: 3.45 MIL/uL — AB (ref 3.87–5.11)
RDW: 14.4 % (ref 11.5–15.5)
WBC: 9.6 10*3/uL (ref 4.0–10.5)

## 2017-01-21 LAB — BASIC METABOLIC PANEL
Anion gap: 4 — ABNORMAL LOW (ref 5–15)
BUN: 10 mg/dL (ref 6–20)
CALCIUM: 7.2 mg/dL — AB (ref 8.9–10.3)
CO2: 22 mmol/L (ref 22–32)
CREATININE: 0.69 mg/dL (ref 0.44–1.00)
Chloride: 108 mmol/L (ref 101–111)
GFR calc Af Amer: >60 mL/min (ref >60)
Glucose, Bld: 138 mg/dL — ABNORMAL HIGH (ref 65–99)
Potassium: 4.2 mmol/L (ref 3.5–5.1)
SODIUM: 134 mmol/L — AB (ref 135–145)

## 2017-01-21 MED ORDER — PROPOFOL 10 MG/ML IV BOLUS
INTRAVENOUS | Status: AC
  Filled 2017-01-21: qty 20

## 2017-01-21 MED ORDER — SENNOSIDES-DOCUSATE SODIUM 8.6-50 MG PO TABS: 1.0000 | ORAL_TABLET | Freq: Every evening | ORAL | Status: DC | PRN

## 2017-01-21 MED ORDER — MIDAZOLAM HCL 2 MG/2ML IJ SOLN
INTRAMUSCULAR | Status: AC
  Filled 2017-01-21: qty 2

## 2017-01-21 MED ORDER — ZOLPIDEM TARTRATE 5 MG PO TABS
5.0000 mg | ORAL_TABLET | Freq: Every evening | ORAL | Status: DC | PRN
  Administered 2017-01-21: 5 mg via ORAL
  Filled 2017-01-21: qty 1

## 2017-01-21 MED ORDER — LIDOCAINE HCL (PF) 1 % IJ SOLN
INTRAMUSCULAR | Status: AC
  Filled 2017-01-21: qty 5

## 2017-01-21 MED ORDER — SUCCINYLCHOLINE CHLORIDE 20 MG/ML IJ SOLN
INTRAMUSCULAR | Status: AC
  Filled 2017-01-21: qty 1

## 2017-01-21 MED ORDER — ONDANSETRON HCL 4 MG/2ML IJ SOLN
INTRAMUSCULAR | Status: AC
  Filled 2017-01-21: qty 4

## 2017-01-21 MED ORDER — HYDROMORPHONE HCL 1 MG/ML IJ SOLN
1.0000 mg | INTRAMUSCULAR | Status: DC | PRN
  Administered 2017-01-21 (×2): 2 mg via INTRAVENOUS
  Filled 2017-01-21 (×2): qty 2

## 2017-01-21 MED ORDER — NEOSTIGMINE METHYLSULFATE 10 MG/10ML IV SOLN
INTRAVENOUS | Status: AC
  Filled 2017-01-21: qty 1

## 2017-01-21 MED ORDER — ROCURONIUM BROMIDE 50 MG/5ML IV SOLN
INTRAVENOUS | Status: AC
  Filled 2017-01-21: qty 1

## 2017-01-21 MED ORDER — OXYCODONE-ACETAMINOPHEN 5-325 MG PO TABS: 1.0000 | ORAL_TABLET | ORAL | 0 refills | Status: DC | PRN

## 2017-01-21 MED ORDER — ONDANSETRON HCL 8 MG PO TABS: 8.0000 mg | ORAL_TABLET | Freq: Four times a day (QID) | ORAL | 0 refills | Status: DC | PRN

## 2017-01-21 MED ORDER — GLYCOPYRROLATE 0.2 MG/ML IJ SOLN
INTRAMUSCULAR | Status: AC
  Filled 2017-01-21: qty 3

## 2017-01-21 MED ORDER — FENTANYL CITRATE (PF) 100 MCG/2ML IJ SOLN
INTRAMUSCULAR | Status: AC
Start: 2017-01-21 — End: ?
  Filled 2017-01-21: qty 2

## 2017-01-21 MED ORDER — ONDANSETRON HCL 4 MG PO TABS: 8.0000 mg | ORAL_TABLET | Freq: Four times a day (QID) | ORAL | Status: DC | PRN

## 2017-01-21 MED ORDER — KETOROLAC TROMETHAMINE 30 MG/ML IJ SOLN
30.0000 mg | Freq: Once | INTRAMUSCULAR | Status: AC
  Administered 2017-01-21: 30 mg via INTRAVENOUS
  Filled 2017-01-21: qty 1

## 2017-01-21 MED ORDER — OXYCODONE-ACETAMINOPHEN 5-325 MG PO TABS
1.0000 | ORAL_TABLET | ORAL | Status: DC | PRN
  Administered 2017-01-21 – 2017-01-22 (×4): 2 via ORAL
  Filled 2017-01-21 (×3): qty 2

## 2017-01-21 MED ORDER — ALUM & MAG HYDROXIDE-SIMETH 200-200-20 MG/5ML PO SUSP: 30.0000 mL | ORAL | Status: DC | PRN

## 2017-01-21 MED ORDER — NEOSTIGMINE METHYLSULFATE 10 MG/10ML IV SOLN
INTRAVENOUS | Status: DC | PRN
  Administered 2017-01-21: 3 mg via INTRAVENOUS

## 2017-01-21 MED ORDER — DOCUSATE SODIUM 100 MG PO CAPS
100.0000 mg | ORAL_CAPSULE | Freq: Two times a day (BID) | ORAL | Status: DC
  Administered 2017-01-21 – 2017-01-22 (×4): 100 mg via ORAL
  Filled 2017-01-21 (×4): qty 1

## 2017-01-21 MED ORDER — SODIUM CHLORIDE 0.9 % IV SOLN
8.0000 mg | Freq: Four times a day (QID) | INTRAVENOUS | Status: DC | PRN
  Filled 2017-01-21: qty 4

## 2017-01-21 MED ORDER — GLYCOPYRROLATE 0.2 MG/ML IJ SOLN
INTRAMUSCULAR | Status: DC | PRN
  Administered 2017-01-21: .6 mg via INTRAVENOUS

## 2017-01-21 MED ORDER — MENTHOL 3 MG MT LOZG: 1.0000 | LOZENGE | OROMUCOSAL | Status: DC | PRN

## 2017-01-21 MED ORDER — BUPIVACAINE LIPOSOME 1.3 % IJ SUSP
INTRAMUSCULAR | Status: DC | PRN
  Administered 2017-01-21: 20 mL

## 2017-01-21 MED ORDER — KCL IN DEXTROSE-NACL 20-5-0.45 MEQ/L-%-% IV SOLN
INTRAVENOUS | Status: DC
  Administered 2017-01-21: 125 mL via INTRAVENOUS

## 2017-01-21 MED ORDER — BISACODYL 10 MG RE SUPP: 10.0000 mg | Freq: Every day | RECTAL | Status: DC | PRN

## 2017-01-21 MED ORDER — FENTANYL CITRATE (PF) 250 MCG/5ML IJ SOLN
INTRAMUSCULAR | Status: AC
  Filled 2017-01-21: qty 5

## 2017-01-21 MED ORDER — KETOROLAC TROMETHAMINE 10 MG PO TABS: 10.0000 mg | ORAL_TABLET | Freq: Three times a day (TID) | ORAL | 0 refills | Status: DC | PRN

## 2017-01-21 MED ORDER — SIMETHICONE 80 MG PO CHEW: 80.0000 mg | CHEWABLE_TABLET | Freq: Four times a day (QID) | ORAL | Status: DC | PRN

## 2017-01-21 NOTE — Anesthesia Postprocedure Evaluation (Signed)
Anesthesia Post Note  Patient: [name]  Procedure(s) Performed: Procedure(s) (LRB): DIAGNOSTIC LAPAROSCOPY WITH SUCTION OF HEMOPERITONEUM (N/A) EXPLORATORY LAPAROTOMY WITH OPERATIVE REMOVAL OF LEFT CORUAL PREGNANCY (Left)  Patient location during evaluation: PACU Anesthesia Type: General Level of consciousness: awake and alert and oriented Pain management: pain level controlled Vital Signs Assessment: post-procedure vital signs reviewed and stable Respiratory status: spontaneous breathing Cardiovascular status: blood pressure returned to baseline and stable Postop Assessment: no signs of nausea or vomiting Anesthetic complications: no     Last Vitals:  Vitals:   01/21/17 0110 01/21/17 0115  BP:  115/70  Pulse: (!) 101 74  Resp: (!) 22 11  Temp:      Last Pain:  Vitals:   01/21/17 0043  TempSrc:   PainSc: 6                  Atavia Poppe

## 2017-01-21 NOTE — Anesthesia Postprocedure Evaluation (Signed)
Anesthesia Post Note  Patient: Ruth Dunlap  Procedure(s) Performed: Procedure(s) (LRB): DIAGNOSTIC LAPAROSCOPY WITH SUCTION OF HEMOPERITONEUM (N/A) EXPLORATORY LAPAROTOMY WITH OPERATIVE REMOVAL OF LEFT CORUAL PREGNANCY (Left)  Patient location during evaluation: Nursing Unit Anesthesia Type: General Level of consciousness: awake and alert and oriented Pain management: pain level controlled Vital Signs Assessment: post-procedure vital signs reviewed and stable Respiratory status: spontaneous breathing Cardiovascular status: blood pressure returned to baseline Postop Assessment: no signs of nausea or vomiting and adequate PO intake Anesthetic complications: no     Last Vitals:  Vitals:   01/21/17 0447 01/21/17 0547  BP: 112/64 (!) 105/55  Pulse: 94 (!) 105  Resp: 17 16  Temp: 36.6 C 36.5 C    Last Pain:  Vitals:   01/21/17 1151  TempSrc:   PainSc: 7                  [name]

## 2017-01-21 NOTE — Addendum Note (Signed)
Addendum  created 01/21/17 1433 by Ollen Bowl, CRNA   Sign clinical note

## 2017-01-21 NOTE — Progress Notes (Signed)
Awake. Resting quietly. resp adequate/nonlabored. Blood infusing left AC without difficulty. Transferred to room in good condition. Pink underwear in clear plastic bag at bedside.

## 2017-01-21 NOTE — Discharge Instructions (Signed)
Abdominal Hysterectomy, Care After °This sheet gives you information about how to care for yourself after your procedure. Your health care provider may also give you more specific instructions. If you have problems or questions, contact your health care provider. °What can I expect after the procedure? °After your procedure, it is common to have: °· Pain. °· Fatigue. °· Poor appetite. °· Less interest in sex. °· Vaginal bleeding and discharge. You may need to use a sanitary napkin after this procedure. ° °Follow these instructions at home: °Bathing °· Do not take baths, swim, or use a hot tub until your health care provider approves. Ask your health care provider if you can take showers. You may only be allowed to take sponge baths for bathing. °· Keep the bandage (dressing) dry until your health care provider says it can be removed. °Incision care °· Follow instructions from your health care provider about how to take care of your incision. Make sure you: °? Wash your hands with soap and water before you change your bandage (dressing). If soap and water are not available, use hand sanitizer. °? Change your dressing as told by your health care provider. °? Leave stitches (sutures), skin glue, or adhesive strips in place. These skin closures may need to stay in place for 2 weeks or longer. If adhesive strip edges start to loosen and curl up, you may trim the loose edges. Do not remove adhesive strips completely unless your health care provider tells you to do that. °· Check your incision area every day for signs of infection. Check for: °? Redness, swelling, or pain. °? Fluid or blood. °? Warmth. °? Pus or a bad smell. °Activity °· Do gentle, daily exercises as told by your health care provider. You may be told to take short walks every day and go farther each time. °· Do not lift anything that is heavier than 10 lb (4.5 kg), or the limit that your health care provider tells you, until he or she says that it is  safe. °· Do not drive or use heavy machinery while taking prescription pain medicine. °· Do not drive for 24 hours if you were given a medicine to help you relax (sedative). °· Follow your health care provider's instructions about exercise, driving, and general activities. Ask your health care provider what activities are safe for you. °Lifestyle °· Do not douche, use tampons, or have sex for at least 6 weeks or as told by your health care provider. °· Do not drink alcohol until your health care provider approves. °· Drink enough fluid to keep your urine clear or pale yellow. °· Try to have someone at home with you for the first 1-2 weeks to help. °· Do not use any products that contain nicotine or tobacco, such as cigarettes and e-cigarettes. These can delay healing. If you need help quitting, ask your health care provider. °General instructions °· Take over-the-counter and prescription medicines only as told by your health care provider. °· Do not take aspirin or ibuprofen. These medicines can cause bleeding. °· To prevent or treat constipation while you are taking prescription pain medicine, your health care provider may recommend that you: °? Drink enough fluid to keep your urine clear or pale yellow. °? Take over-the-counter or prescription medicines. °? Eat foods that are high in fiber, such as fresh fruits and vegetables, whole grains, and beans. °? Limit foods that are high in fat and processed sugars, such as fried and sweet foods. °· Keep all   follow-up visits as told by your health care provider. This is important. °Contact a health care provider if: °· You have chills or fever. °· You have redness, swelling, or pain around your incision. °· You have fluid or blood coming from your incision. °· Your incision feels warm to the touch. °· You have pus or a bad smell coming from your incision. °· Your incision breaks open. °· You feel dizzy or light-headed. °· You have pain or bleeding when you urinate. °· You  have persistent diarrhea. °· You have persistent nausea and vomiting. °· You have abnormal vaginal discharge. °· You have a rash. °· You have any type of abnormal reaction or you develop an allergy to your medicine. °· Your pain medicine does not help. °Get help right away if: °· You have a fever and your symptoms suddenly get worse. °· You have severe abdominal pain. °· You have shortness of breath. °· You faint. °· You have pain, swelling, or redness in your leg. °· You have heavy vaginal bleeding with blood clots. °Summary °· After your procedure, it is common to have pain, fatigue and vaginal discharge. °· Do not take baths, swim, or use a hot tub until your health care provider approves. Ask your health care provider if you can take showers. You may only be allowed to take sponge baths for bathing. °· Follow your health care provider's instructions about exercise, driving, and general activities. Ask your health care provider what activities are safe for you. °· Do not lift anything that is heavier than 10 lb (4.5 kg), or the limit that your health care provider tells you, until he or she says that it is safe. °· Try to have someone at home with you for the first 1-2 weeks to help. °This information is not intended to replace advice given to you by your health care provider. Make sure you discuss any questions you have with your health care provider. °Document Released: 01/30/2005 Document Revised: 07/01/2016 Document Reviewed: 07/01/2016 °Elsevier Interactive Patient Education © 2017 [employer] ° °

## 2017-01-21 NOTE — Progress Notes (Signed)
1 Day Post-Op Procedure(s) (LRB): DIAGNOSTIC LAPAROSCOPY WITH SUCTION OF HEMOPERITONEUM (N/A) EXPLORATORY LAPAROTOMY WITH OPERATIVE REMOVAL OF LEFT CORUAL PREGNANCY (Left) Cornual resection of the uterus  Subjective: Patient reports incisional pain, tolerating PO and no problems voiding.    Objective: I have reviewed patient's vital signs, intake and output, medications and labs.  General: alert, cooperative and no distress GI: soft, non-tender; bowel sounds normal; no masses,  no organomegaly and incision: clean, dry and intact normal post op status  Assessment: s/p Procedure(s): DIAGNOSTIC LAPAROSCOPY WITH SUCTION OF HEMOPERITONEUM (N/A) EXPLORATORY LAPAROTOMY WITH OPERATIVE REMOVAL OF LEFT CORUAL PREGNANCY (Left): stable, progressing well and tolerating diet  Plan: Advance diet Encourage ambulation Advance to PO medication Discontinue IV fluids   Discharge tomorrow am  LOS: 0 days    Ruth Dunlap 01/21/2017, 12:54 PM

## 2017-01-21 NOTE — Transfer of Care (Signed)
Immediate Anesthesia Transfer of Care Note  Patient: Ruth Dunlap  Procedure(s) Performed: Procedure(s): DIAGNOSTIC LAPAROSCOPY WITH SUCTION OF HEMOPERITONEUM (N/A) EXPLORATORY LAPAROTOMY WITH OPERATIVE REMOVAL OF LEFT CORUAL PREGNANCY (Left)  Patient Location: PACU  Anesthesia Type:General  Level of Consciousness: awake and alert   Airway & Oxygen Therapy: Patient Spontanous Breathing  Post-op Assessment: Report given to RN  Post vital signs: Reviewed and stable  Last Vitals:  Vitals:   01/21/17 0043 01/21/17 0045  BP: 115/82   Pulse: (!) 101 (!) 102  Resp: (!) 22 (!) 22  Temp: 36.6 C     Last Pain:  Vitals:   01/21/17 0043  TempSrc:   PainSc: 6          Complications: No apparent anesthesia complications

## 2017-01-21 NOTE — Op Note (Signed)
Preoperative diagnosis:  Ruptured ectopic pregnancy with significant hemoperitoneum and hemodynamic instability                                          Unable to determine which fallopian tube was involved prior to surgery  Postoperative diagnosis:  Ruptured left cornual ectopic pregnancy with significant hemoperitoneum of approximately 1200 cc  Procedure:  > Diagnostic laparoscopy with suctioning of hemoperitoneum and diagnosis of ruptured cornual pregnancy                     > Exploratory laparotomy with left uterine cornual resection for the surgical management of a ruptured left cornual                                         pregnancy and evacuation of hemoperitoneum  Surgeon:EURE,LUTHER H, MD   Anesthesia:  Gen. Endotracheal  Findings: patient presented to the emergency department at approximately 1900 with complaint of sudden onset of left lower quadrant and pelvic pain at approximately 1630.  Upon evaluation the patient was found to have a positive pregnancy test and a quantitative hCG of 4400.  Sonogram revealed a large amount of clotted blood in the pelvis consistent with a ruptured ectopic pregnancy.  I was reviewing the ultrasound in real time and made my decision based on my evaluation the sonogram and I was unable to determine which fallopian tube was involved.  The patient stated that her initial pain was suddenly intense on the left so I seen that was the side involved.  The patient was hypotensive and hemodynamically unstable and cross matched blood was made available when we get to the OR.  At the time of surgery she had a large amount of hemoperitoneum approximately 1200 cc.  Interestingly after evaluation of the pelvis with laparoscopy and made the diagnosis of a ruptured left cornual pregnancy.  As a result I proceeded with a laparotomy with a cornual resection.  Ordinarily I would've attempted it laparoscopically however the patient was hypotensive there was still active  bleeding coming from the cornual disruption and I was concerned it would take some time to gain hemostasis.  As a result I converted to an exploratory laparotomy with cornual resection.  Description of operation: Patient was taken to the operating room where she was placed in supine position and underwent general endotracheal anesthesia She was placed in low lithotomy position and prepped and draped in usual sterile fashion I was aware of her previous surgery and so I performed an open laparoscopic procedure in the usual fashion The video laparoscope with an 11 trocar was placed in the peritoneal cavity after the fascia was entered and the peritoneum was insufflated Under direct visualization 5 mm trochars were placed in the right and left lower quadrant without difficulty As stated as above there was a large amount of blood in the belly ultimately all told approximate 1200 cc Suction irrigation was used and I was eventually able to identify that both fallopian tubes did not have an ectopic pregnancy in the mall though the left fallopian tube was certainly tortuous somewhat dilated and not normal but it definitely did not have an ectopic in it Likewise the right fallopian tube did not have an ectopic What I thought was adherent blood  clot to the left cornu of the uterus turned out to be a cornual rupture from the ectopic pregnancy The cornu was still bleeding although not vigorously but I decided because of the patient's status that attempting a laparoscopic resection would have been less safe for the patient as blood was hanging the patient was still hypotensive and the area was still bleeding As a result I converted to a laparotomy The 2 lower quadrant incisions were closed with skin staples The umbilical fascia was closed with a single 0 Vicryl suture The subcutaneous tissue was closed with a single 0 Vicryl suture and the skin was closed with skin staples  A Pfannenstiel skin incision was made  and carried down sharply to the rectus fascia which was scored in the midline extended laterally The fascia was taken off the muscles superiorly and without difficulty The muscles were divided in the peritoneal cavity was entered A small Alexis self-retaining retractor was placed The left adnexa was grasped with a Babcock clamp gaining access to the left uterine cornu A Bovie was used and a cornual resection was performed gaining a margin around the entire defect in pregnancy The tissue was sent to pathology for evaluation The uterine defect was closed with interrupted figure-of-eight sutures using 0 Monocryl suture Approximately 8 or 10 overlapping Monocryl figure-of-eight's were placed for hemostasis The pelvis was irrigated vigorously and the pedicle was found to be hemostatic I documented the case thoroughly both laparoscopically and during the laparotomy with photographs I removed as much of the hemoperitoneum as I could with suction lap pads and irrigation  The muscles and peritoneum reapproximated loosely The fascia was closed using 0 Vicryl running The subcutaneous tissue was made hemostatic The skin was closed using skin staples  The patient tolerated the procedure well She experienced approximately 100 cc of blood loss during the procedure but again there was approximately 1200 cc of hemoperitoneum The patient had a large amount of clear urine output during the procedure 1 unit of blood was giving in the OR and the second unit was hanging as we went to the PACU  The patient also received 2 g of Ancef prophylactically as well as 30 mg of Toradol IV All counts were correct 3 at the end of the procedure  Florian Buff, MD 01/21/2017 12:46 AM

## 2017-01-22 ENCOUNTER — Encounter (HOSPITAL_COMMUNITY): Payer: Self-pay | Admitting: Obstetrics & Gynecology

## 2017-01-22 LAB — CBC
HCT: 26.1 % — ABNORMAL LOW (ref 36.0–46.0)
Hemoglobin: 8.8 g/dL — ABNORMAL LOW (ref 12.0–15.0)
MCH: 26.7 pg (ref 26.0–34.0)
MCHC: 33.7 g/dL (ref 30.0–36.0)
MCV: 79.3 fL (ref 78.0–100.0)
Platelets: 159 10*3/uL (ref 150–400)
RBC: 3.29 MIL/uL — ABNORMAL LOW (ref 3.87–5.11)
RDW: 15 % (ref 11.5–15.5)
WBC: 8.2 10*3/uL (ref 4.0–10.5)

## 2017-01-22 NOTE — Progress Notes (Signed)
Iv removed, patient tolerated well. Discharge instructions given at bedside.
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
## 2017-01-25 IMAGING — CR DG CHEST 1V PORT
1 series · 1 of 1 positions shown · non-contrast
Comparison: 07/13/2015

CLINICAL DATA: 19-year-old female with respiratory failure.

EXAM:
PORTABLE CHEST 1 VIEW

[AP]
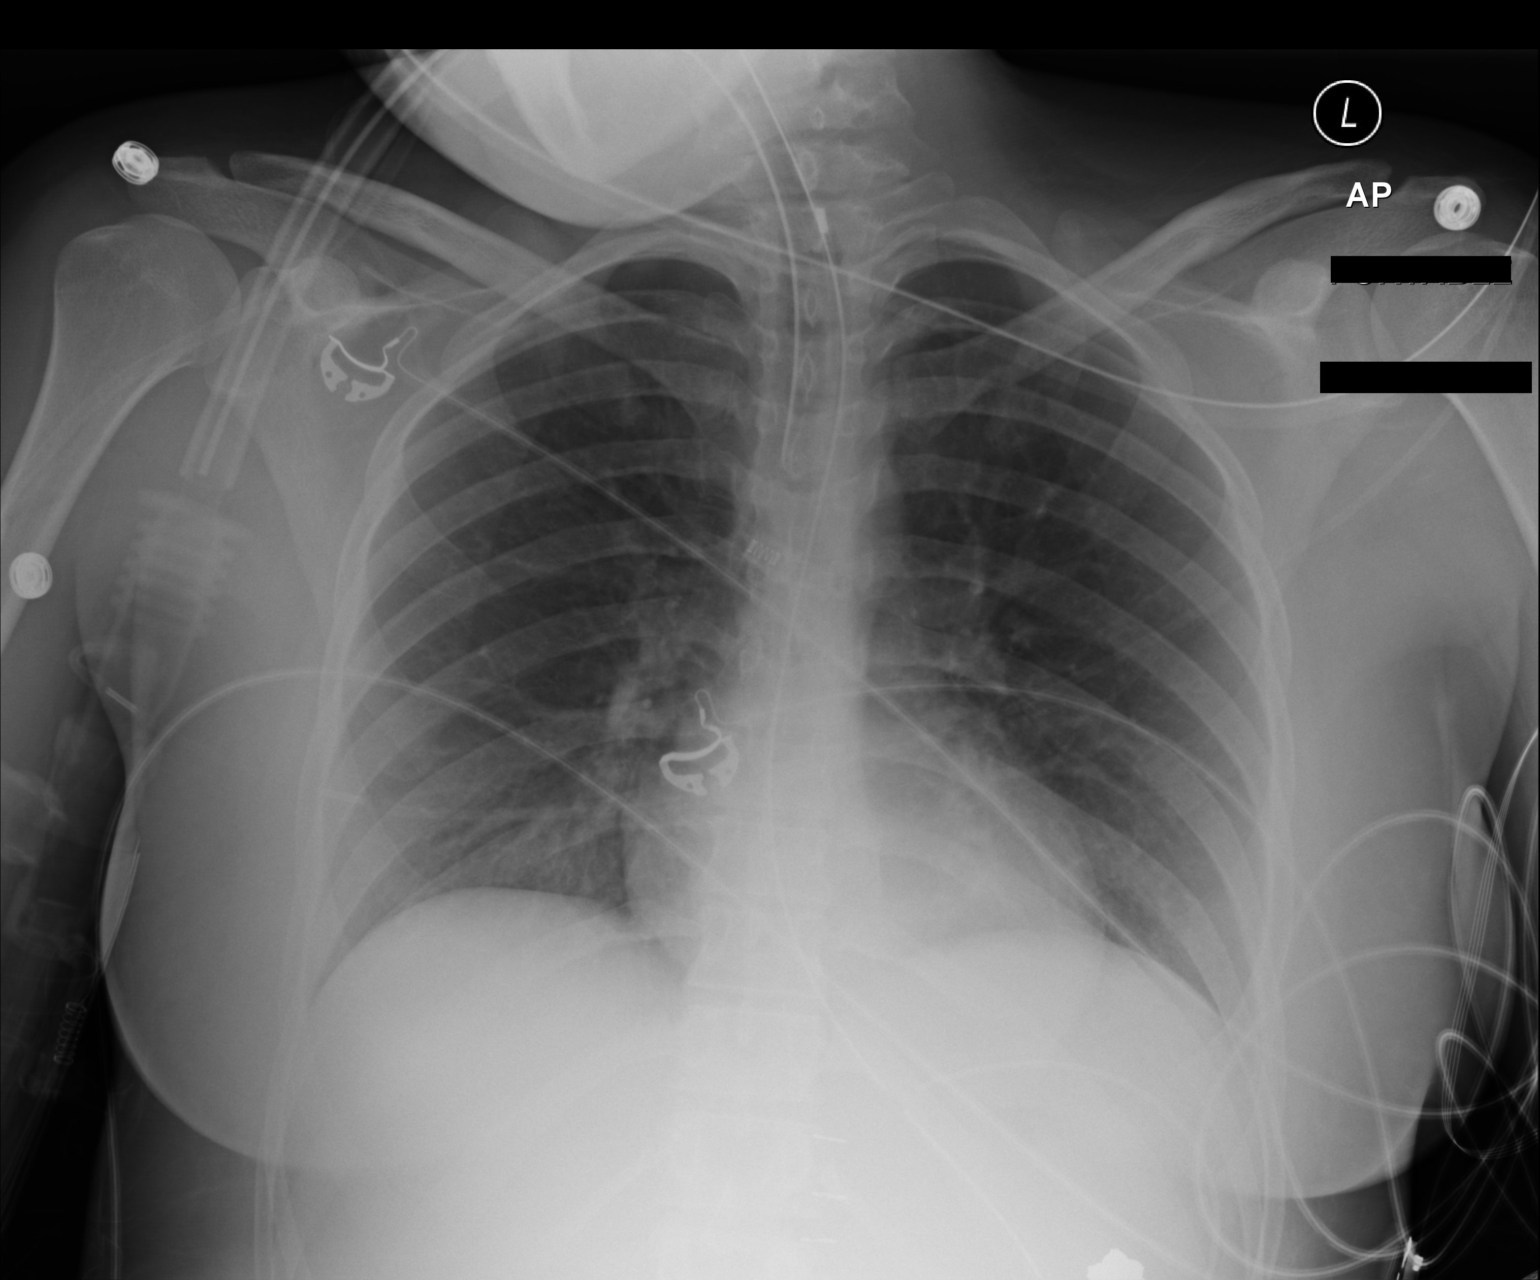

[1 of 1 positions shown; findings below may reference images not displayed]

FINDINGS: An endotracheal tube is identified with tip 3.5 cm above carina.

An NG tube is noted within the stomach.

There is no evidence of focal airspace disease, pulmonary edema,
suspicious pulmonary nodule/mass, pleural effusion, or pneumothorax.

No acute bony abnormalities are identified.
IMPRESSION: Support apparatus as described without evidence of acute
cardiopulmonary disease.
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
## 2017-01-27 NOTE — Discharge Summary (Signed)
.   Physician Discharge Summary  Patient ID: Ruth Dunlap MRN: [id number] DOB/AGE: 1996/05/04 21 y.o.  Admit date: 01/20/2017 Discharge date: 01/27/2017  Admission Diagnoses: Ruptured ectopic pregnancy with significant hemoperitoneum, hemo dyanmically unstable  Discharge Diagnoses:  Active Problems:   Status post exploratory laparotomy with surgical management of a cornual pregnancy  Discharged Condition: stable  Hospital Course: unremarkeable post operative course, received 2 units PRBC  Consults: None  Significant Diagnostic Studies: labs:   Treatments: surgery: Laparoscopy then laparotomy with cornual resection of the uterus for surgical management of a rupture cornual pregnancy  Discharge Exam: Blood pressure (!) 118/57, pulse (!) 106, temperature 98.1 F (36.7 C), temperature source Oral, resp. rate 16, height 5\' 6"  (1.676 m), weight 164 lb (74.4 kg), last menstrual period 01/05/2017, SpO2 100 %, not currently breastfeeding. General appearance: alert, cooperative and no distress GI: soft, non-tender; bowel sounds normal; no masses,  no organomegaly Incision/Wound:clean dry intact  Disposition: 01-Home or Self Care  Discharge Instructions    Call MD for:  persistant nausea and vomiting    Complete by:  As directed    Call MD for:  severe uncontrolled pain    Complete by:  As directed    Call MD for:  temperature >100.4    Complete by:  As directed    Diet - low sodium heart healthy    Complete by:  As directed    Driving Restrictions    Complete by:  As directed    No driving for 1 week   Increase activity slowly    Complete by:  As directed    Leave dressing on - Keep it clean, dry, and intact until clinic visit    Complete by:  As directed    Lifting restrictions    Complete by:  As directed    Do not lift more than 10 pounds for 6 weeks   Sexual Activity Restrictions    Complete by:  As directed    No sex for 6 weeks     Allergies as of 01/22/2017    Reactions   Reglan [metoclopramide] Other (See Comments)   Involuntary muslce movements, muscle spasms      Medication List    TAKE these medications   ketorolac 10 MG tablet Commonly known as:  TORADOL Take 1 tablet (10 mg total) by mouth every 8 (eight) hours as needed.   ondansetron 8 MG tablet Commonly known as:  ZOFRAN Take 1 tablet (8 mg total) by mouth every 6 (six) hours as needed for nausea.   oxyCODONE-acetaminophen 5-325 MG tablet Commonly known as:  PERCOCET/ROXICET Take 1-2 tablets by mouth every 4 (four) hours as needed (moderate to severe pain (when tolerating fluids)).   promethazine 25 MG tablet Commonly known as:  PHENERGAN Take 1 tablet (25 mg total) by mouth every 6 (six) hours as needed for nausea or vomiting.      Follow-up Information    Florian Buff, MD On 01/28/2017.   Specialties:  Obstetrics and Gynecology, Radiology Why:  12:15,post op visit   Contact information: [place] [id number] [phone]           Signed: Florian Buff 01/27/2017, 9:22 PM

## 2017-01-28 ENCOUNTER — Ambulatory Visit (INDEPENDENT_AMBULATORY_CARE_PROVIDER_SITE_OTHER): Payer: Medicaid Other | Admitting: Obstetrics & Gynecology

## 2017-01-28 ENCOUNTER — Encounter: Payer: Self-pay | Admitting: Obstetrics & Gynecology

## 2017-01-28 VITALS — BP 110/62 | HR 93 | Wt 161.0 lb

## 2017-01-28 DIAGNOSIS — Z9889 Other specified postprocedural states: Secondary | ICD-10-CM

## 2017-01-28 MED ORDER — OXYCODONE-ACETAMINOPHEN 5-325 MG PO TABS: 1.0000 | ORAL_TABLET | ORAL | 0 refills | Status: DC | PRN

## 2017-01-28 NOTE — Progress Notes (Signed)
  HPI: Patient returns for routine postoperative follow-up having undergone diagnostic laparoscopy with exploratory laparotomy and cornual resection of the uterus for a ruptured cornual pregnancy on 01/20/2017.  The patient's immediate postoperative recovery has been unremarkable. Since hospital discharge the patient reports appropriate soreness and pain postop Should her bowel function is just returned and she is voiding without difficulty Her appetite is returning.   Current Outpatient Prescriptions: ketorolac (TORADOL) 10 MG tablet, Take 1 tablet (10 mg total) by mouth every 8 (eight) hours as needed., Disp: 15 tablet, Rfl: 0 ondansetron (ZOFRAN) 8 MG tablet, Take 1 tablet (8 mg total) by mouth every 6 (six) hours as needed for nausea., Disp: 20 tablet, Rfl: 0 oxyCODONE-acetaminophen (PERCOCET/ROXICET) 5-325 MG tablet, Take 1-2 tablets by mouth every 4 (four) hours as needed (moderate to severe pain (when tolerating fluids))., Disp: 40 tablet, Rfl: 0 promethazine (PHENERGAN) 25 MG tablet, Take 1 tablet (25 mg total) by mouth every 6 (six) hours as needed for nausea or vomiting., Disp: 10 tablet, Rfl: 0  No current facility-administered medications for this visit.     Blood pressure 110/62, pulse 93, weight 161 lb (73 kg), last menstrual period 01/05/2017, not currently breastfeeding.  Physical Exam: Incision is 4 clean dry and intact staples removed Abdomen with bruising which out expect with the 1500 cc of hemoperitoneum   Diagnostic Tests:   Pathology: Pathology is consistent with a ruptured cornual pregnancy with myometrium on the pathological specimen  Impression: Status post diagnostic laparoscopy with subsequent exploratory laparotomy for a ruptured cornual pregnancy  Plan: Routine postop care a refill her Percocet and see her back in 5 weeks for final postop visit  Follow up: 5  weeks  Meds ordered this encounter  Medications  . oxyCODONE-acetaminophen  (PERCOCET/ROXICET) 5-325 MG tablet    Sig: Take 1-2 tablets by mouth every 4 (four) hours as needed (moderate to severe pain (when tolerating fluids)).    Dispense:  40 tablet    Refill:  0    Florian Buff, MD
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
## 2017-02-05 ENCOUNTER — Other Ambulatory Visit: Payer: Self-pay | Admitting: Obstetrics & Gynecology

## 2017-02-10 ENCOUNTER — Ambulatory Visit: Payer: Medicaid Other | Admitting: Obstetrics and Gynecology

## 2017-02-15 ENCOUNTER — Telehealth: Payer: Self-pay | Admitting: Obstetrics & Gynecology

## 2017-02-15 NOTE — Telephone Encounter (Signed)
Patient's grandmother called for patient because she does not have a phone right now. Pt states that she is having pain and needs a something for it. Please contact pt

## 2017-02-15 NOTE — Telephone Encounter (Signed)
Spoke to patient's grandmother who states patient called her saying she was in pain and needed more pain medicine. Informed her that I would like to speak with the patient directly if possible to assess her and see how her pain is. Stated she would try to get in touch with her and get back to me.

## 2017-02-15 NOTE — Telephone Encounter (Signed)
Returned call. Ruth Dunlap states she is not with the patient at this time but should be before 5. Will get her to return call.

## 2017-02-15 NOTE — Telephone Encounter (Signed)
Patient called stating that she was told by her grandmother that she neds to contact the office to speak with a nurse. Please contact pt

## 2017-02-15 NOTE — Telephone Encounter (Signed)
Patient called stating she is experiencing pulling and cramping in her lower abdomen. She is out of pain medication and is requesting a refill, she has not tried Ibuprofen. Advised patient she may need try Ibuprofen before refill but please advise.

## 2017-02-16 ENCOUNTER — Encounter (HOSPITAL_COMMUNITY): Payer: Self-pay | Admitting: Emergency Medicine

## 2017-02-16 ENCOUNTER — Other Ambulatory Visit: Payer: Self-pay | Admitting: Obstetrics & Gynecology

## 2017-02-16 ENCOUNTER — Emergency Department (HOSPITAL_COMMUNITY)
Admission: EM | Admit: 2017-02-16 | Discharge: 2017-02-16 | Disposition: A | Discharge status: Home / Self Care | Payer: Medicaid Other | Attending: Emergency Medicine | Admitting: Emergency Medicine

## 2017-02-16 DIAGNOSIS — R1013 Epigastric pain: Secondary | ICD-10-CM | POA: Diagnosis not present

## 2017-02-16 DIAGNOSIS — R110 Nausea: Secondary | ICD-10-CM | POA: Insufficient documentation

## 2017-02-16 DIAGNOSIS — R51 Headache: Secondary | ICD-10-CM | POA: Diagnosis present

## 2017-02-16 DIAGNOSIS — R519 Headache, unspecified: Secondary | ICD-10-CM

## 2017-02-16 HISTORY — DX: Unspecified ectopic pregnancy without intrauterine pregnancy: O00.90

## 2017-02-16 LAB — URINALYSIS, COMPLETE (UACMP) WITH MICROSCOPIC
BILIRUBIN URINE: NEGATIVE
GLUCOSE, UA: NEGATIVE mg/dL
HGB URINE DIPSTICK: NEGATIVE
Ketones, ur: NEGATIVE mg/dL
NITRITE: NEGATIVE
Protein, ur: NEGATIVE mg/dL
SPECIFIC GRAVITY, URINE: 1.033 — AB (ref 1.005–1.030)
pH: 6 (ref 5.0–8.0)

## 2017-02-16 LAB — COMPREHENSIVE METABOLIC PANEL
ALT: 10 U/L — ABNORMAL LOW (ref 14–54)
ANION GAP: 7 (ref 5–15)
AST: 17 U/L (ref 15–41)
Albumin: 4.2 g/dL (ref 3.5–5.0)
Alkaline Phosphatase: 52 U/L (ref 38–126)
BILIRUBIN TOTAL: 1 mg/dL (ref 0.3–1.2)
BUN: 14 mg/dL (ref 6–20)
CALCIUM: 9.3 mg/dL (ref 8.9–10.3)
CO2: 24 mmol/L (ref 22–32)
Chloride: 104 mmol/L (ref 101–111)
Creatinine, Ser: 0.78 mg/dL (ref 0.44–1.00)
Glucose, Bld: 101 mg/dL — ABNORMAL HIGH (ref 65–99)
POTASSIUM: 4.2 mmol/L (ref 3.5–5.1)
Sodium: 135 mmol/L (ref 135–145)
Total Protein: 7.8 g/dL (ref 6.5–8.1)

## 2017-02-16 LAB — CBC WITH DIFFERENTIAL/PLATELET
BASOS ABS: 0 10*3/uL (ref 0.0–0.1)
BASOS PCT: 0 %
EOS ABS: 0.1 10*3/uL (ref 0.0–0.7)
Eosinophils Relative: 1 %
HEMATOCRIT: 38.4 % (ref 36.0–46.0)
HEMOGLOBIN: 12.2 g/dL (ref 12.0–15.0)
Lymphocytes Relative: 19 %
Lymphs Abs: 1.9 10*3/uL (ref 0.7–4.0)
MCH: 25.6 pg — ABNORMAL LOW (ref 26.0–34.0)
MCHC: 31.8 g/dL (ref 30.0–36.0)
MCV: 80.5 fL (ref 78.0–100.0)
Monocytes Absolute: 0.5 10*3/uL (ref 0.1–1.0)
Monocytes Relative: 6 %
NEUTROS ABS: 7.4 10*3/uL (ref 1.7–7.7)
NEUTROS PCT: 74 %
Platelets: 288 10*3/uL (ref 150–400)
RBC: 4.77 MIL/uL (ref 3.87–5.11)
RDW: 13.7 % (ref 11.5–15.5)
WBC: 9.9 10*3/uL (ref 4.0–10.5)

## 2017-02-16 LAB — PREGNANCY, URINE: PREG TEST UR: NEGATIVE

## 2017-02-16 LAB — LIPASE, BLOOD: LIPASE: 30 U/L (ref 11–51)

## 2017-02-16 MED ORDER — KETOROLAC TROMETHAMINE 10 MG PO TABS: 10.0000 mg | ORAL_TABLET | Freq: Three times a day (TID) | ORAL | 0 refills | Status: DC | PRN

## 2017-02-16 MED ORDER — PANTOPRAZOLE SODIUM 20 MG PO TBEC
20.0000 mg | DELAYED_RELEASE_TABLET | Freq: Every day | ORAL | 0 refills | Status: DC
Start: 2017-02-16 — End: 2017-08-04

## 2017-02-16 MED ORDER — ACETAMINOPHEN 325 MG PO TABS: 650.0000 mg | ORAL_TABLET | Freq: Once | ORAL | Status: DC

## 2017-02-16 NOTE — ED Triage Notes (Signed)
Onset yesterday, recurrent headache and abdominal pain, nasuea
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
## 2017-02-17 NOTE — Telephone Encounter (Signed)
LMOVM that prescription was sent to pharmacy.  

## 2017-03-04 ENCOUNTER — Encounter: Payer: Self-pay | Admitting: *Deleted

## 2017-03-04 ENCOUNTER — Encounter: Payer: Medicaid Other | Admitting: Obstetrics & Gynecology

## 2017-03-10 ENCOUNTER — Encounter: Payer: Medicaid Other | Admitting: Obstetrics & Gynecology

## 2017-03-10 ENCOUNTER — Encounter: Payer: Self-pay | Admitting: Obstetrics & Gynecology

## 2017-05-11 LAB — TYPE AND SCREEN
ABO/RH(D): AB POS
Antibody Screen: NEGATIVE
UNIT DIVISION: 0
UNIT DIVISION: 0
Unit division: 0
Unit division: 0

## 2017-05-11 LAB — BPAM RBC
Blood Product Expiration Date: 201806302359
Blood Product Expiration Date: 201807232359
Blood Product Expiration Date: 201807282359
Blood Product Expiration Date: 201807282359
ISSUE DATE / TIME: 201806272316
ISSUE DATE / TIME: 201806280015
ISSUE DATE / TIME: 201807211151
ISSUE DATE / TIME: 201807211508
Unit Type and Rh: 1700
Unit Type and Rh: 1700
Unit Type and Rh: 600
Unit Type and Rh: 600

## 2017-06-30 ENCOUNTER — Encounter (HOSPITAL_COMMUNITY): Payer: Self-pay | Admitting: Emergency Medicine

## 2017-06-30 ENCOUNTER — Emergency Department (HOSPITAL_COMMUNITY)
Admission: EM | Admit: 2017-06-30 | Discharge: 2017-06-30 | Disposition: A | Discharge status: Home / Self Care | Payer: Medicaid Other | Attending: Emergency Medicine | Admitting: Emergency Medicine

## 2017-06-30 ENCOUNTER — Emergency Department (HOSPITAL_COMMUNITY): Payer: Medicaid Other

## 2017-06-30 DIAGNOSIS — O200 Threatened abortion: Secondary | ICD-10-CM | POA: Insufficient documentation

## 2017-06-30 DIAGNOSIS — R1030 Lower abdominal pain, unspecified: Secondary | ICD-10-CM | POA: Diagnosis present

## 2017-06-30 DIAGNOSIS — Z3A01 Less than 8 weeks gestation of pregnancy: Secondary | ICD-10-CM | POA: Insufficient documentation

## 2017-06-30 LAB — CBC WITH DIFFERENTIAL/PLATELET
BASOS ABS: 0 10*3/uL (ref 0.0–0.1)
Basophils Relative: 0 %
EOS PCT: 1 %
Eosinophils Absolute: 0.1 10*3/uL (ref 0.0–0.7)
HCT: 34.7 % — ABNORMAL LOW (ref 36.0–46.0)
Hemoglobin: 10.9 g/dL — ABNORMAL LOW (ref 12.0–15.0)
LYMPHS ABS: 1.9 10*3/uL (ref 0.7–4.0)
Lymphocytes Relative: 31 %
MCH: 25.1 pg — AB (ref 26.0–34.0)
MCHC: 31.4 g/dL (ref 30.0–36.0)
MCV: 80 fL (ref 78.0–100.0)
MONO ABS: 0.4 10*3/uL (ref 0.1–1.0)
Monocytes Relative: 7 %
Neutro Abs: 3.7 10*3/uL (ref 1.7–7.7)
Neutrophils Relative %: 61 %
PLATELETS: 293 10*3/uL (ref 150–400)
RBC: 4.34 MIL/uL (ref 3.87–5.11)
RDW: 16.5 % — AB (ref 11.5–15.5)
WBC: 6.1 10*3/uL (ref 4.0–10.5)

## 2017-06-30 LAB — URINALYSIS, ROUTINE W REFLEX MICROSCOPIC
BILIRUBIN URINE: NEGATIVE
Glucose, UA: NEGATIVE mg/dL
HGB URINE DIPSTICK: NEGATIVE
KETONES UR: NEGATIVE mg/dL
Leukocytes, UA: NEGATIVE
Nitrite: NEGATIVE
PROTEIN: NEGATIVE mg/dL
Specific Gravity, Urine: 1.028 (ref 1.005–1.030)
pH: 5 (ref 5.0–8.0)

## 2017-06-30 LAB — COMPREHENSIVE METABOLIC PANEL
ALK PHOS: 55 U/L (ref 38–126)
ALT: 14 U/L (ref 14–54)
AST: 17 U/L (ref 15–41)
Albumin: 3.6 g/dL (ref 3.5–5.0)
Anion gap: 7 (ref 5–15)
BUN: 11 mg/dL (ref 6–20)
CO2: 22 mmol/L (ref 22–32)
CREATININE: 0.78 mg/dL (ref 0.44–1.00)
Calcium: 9.1 mg/dL (ref 8.9–10.3)
Chloride: 104 mmol/L (ref 101–111)
GFR calc Af Amer: >60 mL/min (ref >60)
Glucose, Bld: 104 mg/dL — ABNORMAL HIGH (ref 65–99)
Potassium: 3.5 mmol/L (ref 3.5–5.1)
Sodium: 133 mmol/L — ABNORMAL LOW (ref 135–145)
Total Bilirubin: 0.6 mg/dL (ref 0.3–1.2)
Total Protein: 6.9 g/dL (ref 6.5–8.1)

## 2017-06-30 LAB — WET PREP, GENITAL
Clue Cells Wet Prep HPF POC: NONE SEEN
SPERM: NONE SEEN
TRICH WET PREP: NONE SEEN
Yeast Wet Prep HPF POC: NONE SEEN

## 2017-06-30 LAB — I-STAT BETA HCG BLOOD, ED (MC, WL, AP ONLY)

## 2017-06-30 LAB — LIPASE, BLOOD: LIPASE: 32 U/L (ref 11–51)

## 2017-06-30 LAB — HCG, QUANTITATIVE, PREGNANCY: hCG, Beta Chain, Quant, S: 18771 m[IU]/mL — ABNORMAL HIGH (ref <5)

## 2017-06-30 MED ORDER — ACETAMINOPHEN 325 MG PO TABS
650.0000 mg | ORAL_TABLET | Freq: Once | ORAL | Status: AC
  Administered 2017-06-30: 650 mg via ORAL
  Filled 2017-06-30: qty 2

## 2017-06-30 MED ORDER — SODIUM CHLORIDE 0.9 % IV SOLN: INTRAVENOUS | Status: DC

## 2017-06-30 MED ORDER — SODIUM CHLORIDE 0.9 % IV BOLUS (SEPSIS)
1000.0000 mL | Freq: Once | INTRAVENOUS | Status: AC
  Administered 2017-06-30: 1000 mL via INTRAVENOUS

## 2017-06-30 NOTE — ED Notes (Signed)
Pt in US

## 2017-06-30 NOTE — ED Triage Notes (Signed)
Pt c/o lower abd pain. Positive pregnancy test yesterday. lmp-05/21/17. H/s ectopic pregnancy in June.

## 2017-06-30 NOTE — Discharge Instructions (Signed)
The ultrasound showed an early pregnancy.  No signs of tubal pregnancy.  Follow up with your OB GYN doctor later this week or early next week to be rechecked.  Monitor for vaginal bleeding.  You can safely take tylenol for pain
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
## 2017-07-01 LAB — GC/CHLAMYDIA PROBE AMP (~~LOC~~) NOT AT ARMC
Chlamydia: POSITIVE — AB
Neisseria Gonorrhea: NEGATIVE

## 2017-07-02 ENCOUNTER — Telehealth: Payer: Self-pay | Admitting: Student

## 2017-07-02 DIAGNOSIS — A749 Chlamydial infection, unspecified: Secondary | ICD-10-CM

## 2017-07-02 MED ORDER — AZITHROMYCIN 500 MG PO TABS: 1000.0000 mg | ORAL_TABLET | Freq: Once | ORAL | 0 refills | Status: AC

## 2017-07-02 NOTE — Telephone Encounter (Addendum)
Ruth Dunlap tested positive for  Chlamydia. Patient was called by RN and allergies and pharmacy confirmed. Rx sent to pharmacy of choice.   Jorje Guild, NP 07/02/2017 3:58 PM       ----- Message from Bjorn Loser, RN sent at 07/02/2017  3:10 PM EST ----- This patient tested positive for :  Chlamydia  She has an allergy to : " Reglan", I have informed the patient of her results and confirmed her pharmacy is correct in her chart. Please send Rx.   Thank you,   Bjorn Loser, RN   Results faxed to [facility] Department.

## 2017-07-03 ENCOUNTER — Encounter (HOSPITAL_COMMUNITY): Payer: Self-pay | Admitting: Emergency Medicine

## 2017-07-03 ENCOUNTER — Emergency Department (HOSPITAL_COMMUNITY)
Admission: EM | Admit: 2017-07-03 | Discharge: 2017-07-03 | Disposition: A | Discharge status: Home / Self Care | Payer: Medicaid Other | Attending: Emergency Medicine | Admitting: Emergency Medicine

## 2017-07-03 ENCOUNTER — Other Ambulatory Visit: Payer: Self-pay

## 2017-07-03 DIAGNOSIS — O9989 Other specified diseases and conditions complicating pregnancy, childbirth and the puerperium: Secondary | ICD-10-CM | POA: Insufficient documentation

## 2017-07-03 DIAGNOSIS — R1030 Lower abdominal pain, unspecified: Secondary | ICD-10-CM | POA: Insufficient documentation

## 2017-07-03 DIAGNOSIS — N898 Other specified noninflammatory disorders of vagina: Secondary | ICD-10-CM | POA: Diagnosis present

## 2017-07-03 DIAGNOSIS — Z3A01 Less than 8 weeks gestation of pregnancy: Secondary | ICD-10-CM | POA: Insufficient documentation

## 2017-07-03 LAB — CBC WITH DIFFERENTIAL/PLATELET
BASOS ABS: 0 10*3/uL (ref 0.0–0.1)
Basophils Relative: 0 %
Eosinophils Absolute: 0.1 10*3/uL (ref 0.0–0.7)
Eosinophils Relative: 1 %
HEMATOCRIT: 32.9 % — AB (ref 36.0–46.0)
Hemoglobin: 10.4 g/dL — ABNORMAL LOW (ref 12.0–15.0)
LYMPHS ABS: 2 10*3/uL (ref 0.7–4.0)
LYMPHS PCT: 28 %
MCH: 25.4 pg — AB (ref 26.0–34.0)
MCHC: 31.6 g/dL (ref 30.0–36.0)
MCV: 80.2 fL (ref 78.0–100.0)
MONO ABS: 0.6 10*3/uL (ref 0.1–1.0)
MONOS PCT: 8 %
NEUTROS ABS: 4.4 10*3/uL (ref 1.7–7.7)
Neutrophils Relative %: 63 %
Platelets: 291 10*3/uL (ref 150–400)
RBC: 4.1 MIL/uL (ref 3.87–5.11)
RDW: 16.4 % — AB (ref 11.5–15.5)
WBC: 6.9 10*3/uL (ref 4.0–10.5)

## 2017-07-03 LAB — URINALYSIS, ROUTINE W REFLEX MICROSCOPIC
BILIRUBIN URINE: NEGATIVE
Bacteria, UA: NONE SEEN
Glucose, UA: NEGATIVE mg/dL
HGB URINE DIPSTICK: NEGATIVE
Ketones, ur: NEGATIVE mg/dL
Nitrite: NEGATIVE
PROTEIN: NEGATIVE mg/dL
Specific Gravity, Urine: 1.012 (ref 1.005–1.030)
pH: 7 (ref 5.0–8.0)

## 2017-07-03 LAB — BASIC METABOLIC PANEL
ANION GAP: 6 (ref 5–15)
BUN: 10 mg/dL (ref 6–20)
CALCIUM: 8.9 mg/dL (ref 8.9–10.3)
CO2: 23 mmol/L (ref 22–32)
Chloride: 108 mmol/L (ref 101–111)
Creatinine, Ser: 0.69 mg/dL (ref 0.44–1.00)
GFR calc Af Amer: >60 mL/min (ref >60)
GFR calc non Af Amer: >60 mL/min (ref >60)
GLUCOSE: 87 mg/dL (ref 65–99)
Potassium: 3.8 mmol/L (ref 3.5–5.1)
Sodium: 137 mmol/L (ref 135–145)

## 2017-07-03 LAB — I-STAT BETA HCG BLOOD, ED (MC, WL, AP ONLY): I-stat hCG, quantitative: >2000 m[IU]/mL — ABNORMAL HIGH (ref <5)

## 2017-07-03 MED ORDER — ONDANSETRON HCL 4 MG/2ML IJ SOLN
4.0000 mg | Freq: Once | INTRAMUSCULAR | Status: AC
  Administered 2017-07-03: 4 mg via INTRAVENOUS
  Filled 2017-07-03: qty 2

## 2017-07-03 MED ORDER — SODIUM CHLORIDE 0.9 % IV BOLUS (SEPSIS)
500.0000 mL | Freq: Once | INTRAVENOUS | Status: AC
  Administered 2017-07-03: 500 mL via INTRAVENOUS

## 2017-07-03 NOTE — Discharge Instructions (Signed)
Make sure that you are getting plenty of rest and drinking a lot of fluids.  Try using heat on the sore area 2 or 3 times a day.  For pain, take ibuprofen or acetaminophen.  There is a small chance that you are undergoing a miscarriage.  Return here, if needed, for problems.  Follow-up with an obstetrician, or gynecologist as needed, and as desired.  If you remain pregnant is important to see an obstetrician within 2 weeks.
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
## 2017-07-03 NOTE — ED Triage Notes (Signed)
Pt is [redacted] weeks pregnant.  States 30 mins ago, she started having n/v and abd pain. No fever noted.  No vaginal bleeding

## 2017-07-05 LAB — RPR: RPR Ser Ql: NONREACTIVE

## 2017-07-05 LAB — HIV ANTIBODY (ROUTINE TESTING W REFLEX): HIV Screen 4th Generation wRfx: NONREACTIVE

## 2017-07-09 ENCOUNTER — Emergency Department (HOSPITAL_COMMUNITY)
Admission: EM | Admit: 2017-07-09 | Discharge: 2017-07-09 | Disposition: A | Discharge status: Home / Self Care | Payer: Medicaid Other | Attending: Emergency Medicine | Admitting: Emergency Medicine

## 2017-07-09 ENCOUNTER — Other Ambulatory Visit: Payer: Self-pay

## 2017-07-09 ENCOUNTER — Emergency Department (HOSPITAL_COMMUNITY): Payer: Medicaid Other

## 2017-07-09 ENCOUNTER — Encounter (HOSPITAL_COMMUNITY): Payer: Self-pay

## 2017-07-09 DIAGNOSIS — Z3A01 Less than 8 weeks gestation of pregnancy: Secondary | ICD-10-CM | POA: Diagnosis not present

## 2017-07-09 DIAGNOSIS — O9989 Other specified diseases and conditions complicating pregnancy, childbirth and the puerperium: Secondary | ICD-10-CM | POA: Diagnosis present

## 2017-07-09 DIAGNOSIS — R0789 Other chest pain: Secondary | ICD-10-CM | POA: Diagnosis not present

## 2017-07-09 LAB — COMPREHENSIVE METABOLIC PANEL
ALBUMIN: 3.4 g/dL — AB (ref 3.5–5.0)
ALT: 13 U/L — ABNORMAL LOW (ref 14–54)
ANION GAP: 6 (ref 5–15)
AST: 16 U/L (ref 15–41)
Alkaline Phosphatase: 59 U/L (ref 38–126)
BILIRUBIN TOTAL: 0.3 mg/dL (ref 0.3–1.2)
BUN: 11 mg/dL (ref 6–20)
CO2: 24 mmol/L (ref 22–32)
Calcium: 9.2 mg/dL (ref 8.9–10.3)
Chloride: 105 mmol/L (ref 101–111)
Creatinine, Ser: 0.64 mg/dL (ref 0.44–1.00)
GFR calc non Af Amer: >60 mL/min (ref >60)
GLUCOSE: 93 mg/dL (ref 65–99)
POTASSIUM: 3.7 mmol/L (ref 3.5–5.1)
Sodium: 135 mmol/L (ref 135–145)
TOTAL PROTEIN: 6.7 g/dL (ref 6.5–8.1)

## 2017-07-09 LAB — URINALYSIS, ROUTINE W REFLEX MICROSCOPIC
BILIRUBIN URINE: NEGATIVE
Glucose, UA: NEGATIVE mg/dL
HGB URINE DIPSTICK: NEGATIVE
KETONES UR: NEGATIVE mg/dL
Leukocytes, UA: NEGATIVE
NITRITE: NEGATIVE
PROTEIN: NEGATIVE mg/dL
Specific Gravity, Urine: 1.028 (ref 1.005–1.030)
pH: 6 (ref 5.0–8.0)

## 2017-07-09 LAB — CBC WITH DIFFERENTIAL/PLATELET
Basophils Absolute: 0 10*3/uL (ref 0.0–0.1)
Basophils Relative: 0 %
EOS ABS: 0.1 10*3/uL (ref 0.0–0.7)
Eosinophils Relative: 1 %
HEMATOCRIT: 34.8 % — AB (ref 36.0–46.0)
Hemoglobin: 10.9 g/dL — ABNORMAL LOW (ref 12.0–15.0)
LYMPHS ABS: 3 10*3/uL (ref 0.7–4.0)
Lymphocytes Relative: 37 %
MCH: 25.5 pg — AB (ref 26.0–34.0)
MCHC: 31.3 g/dL (ref 30.0–36.0)
MCV: 81.5 fL (ref 78.0–100.0)
MONO ABS: 0.7 10*3/uL (ref 0.1–1.0)
MONOS PCT: 8 %
Neutro Abs: 4.4 10*3/uL (ref 1.7–7.7)
Neutrophils Relative %: 54 %
Platelets: 286 10*3/uL (ref 150–400)
RBC: 4.27 MIL/uL (ref 3.87–5.11)
RDW: 16.1 % — AB (ref 11.5–15.5)
WBC: 8.3 10*3/uL (ref 4.0–10.5)

## 2017-07-09 LAB — LIPASE, BLOOD: LIPASE: 31 U/L (ref 11–51)

## 2017-07-09 LAB — TROPONIN I

## 2017-07-09 NOTE — Discharge Instructions (Signed)
There is no evidence of heart attack or blood clot in the lung.  Follow-up with your doctor and your [facility] doctor.  Return to the ED if you develop new or worsening symptoms.

## 2017-07-09 NOTE — ED Triage Notes (Signed)
Pt states she was lying down when she had onset of sharp shooting pains to her left anterior chest and right lateral ribcage.  Pt denies pain at this time.
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
## 2017-07-14 ENCOUNTER — Telehealth: Payer: Self-pay | Admitting: *Deleted

## 2017-07-14 NOTE — Telephone Encounter (Signed)
Pt pregnant has some cramping, had [place] ion ER and needs F/U to come in Friday at 53

## 2017-07-14 NOTE — Telephone Encounter (Signed)
Patient called with complaints of daily pain in the lower abdomen, occasionally on her right side for the past 3 days. States she initially went to the ER on 12/5 for abdominal pain in which the pain stopped but is now back. No bleeding currently. Patient states she was told she may be having a miscarriage because the embryo could not be visualized. Patient wants to be seen and have another u/s asap. Please advise.

## 2017-07-15 ENCOUNTER — Other Ambulatory Visit: Payer: Self-pay | Admitting: Adult Health

## 2017-07-15 DIAGNOSIS — O3680X0 Pregnancy with inconclusive fetal viability, not applicable or unspecified: Secondary | ICD-10-CM

## 2017-07-16 ENCOUNTER — Ambulatory Visit (INDEPENDENT_AMBULATORY_CARE_PROVIDER_SITE_OTHER): Payer: Medicaid Other

## 2017-07-16 DIAGNOSIS — Z3A08 8 weeks gestation of pregnancy: Secondary | ICD-10-CM | POA: Diagnosis not present

## 2017-07-16 DIAGNOSIS — O3680X0 Pregnancy with inconclusive fetal viability, not applicable or unspecified: Secondary | ICD-10-CM

## 2017-07-16 NOTE — Progress Notes (Signed)
[place] 8+3 wks,single IUP w/ys,positive fht 173 bpm,normal ovaries bilat,crl 18.74 mm,EDD 02/22/2018 BY LMP

## 2017-08-04 ENCOUNTER — Ambulatory Visit (INDEPENDENT_AMBULATORY_CARE_PROVIDER_SITE_OTHER): Payer: Medicaid Other | Admitting: Women's Health

## 2017-08-04 ENCOUNTER — Ambulatory Visit: Payer: Medicaid Other | Admitting: *Deleted

## 2017-08-04 ENCOUNTER — Encounter: Payer: Self-pay | Admitting: Women's Health

## 2017-08-04 VITALS — BP 120/68 | HR 68 | Wt 160.0 lb

## 2017-08-04 DIAGNOSIS — Z3481 Encounter for supervision of other normal pregnancy, first trimester: Secondary | ICD-10-CM | POA: Diagnosis not present

## 2017-08-04 DIAGNOSIS — Z331 Pregnant state, incidental: Secondary | ICD-10-CM | POA: Diagnosis not present

## 2017-08-04 DIAGNOSIS — Z3A11 11 weeks gestation of pregnancy: Secondary | ICD-10-CM

## 2017-08-04 DIAGNOSIS — Z640 Problems related to unwanted pregnancy: Secondary | ICD-10-CM | POA: Diagnosis not present

## 2017-08-04 DIAGNOSIS — Z1389 Encounter for screening for other disorder: Secondary | ICD-10-CM

## 2017-08-04 DIAGNOSIS — Z3490 Encounter for supervision of normal pregnancy, unspecified, unspecified trimester: Secondary | ICD-10-CM | POA: Insufficient documentation

## 2017-08-04 MED ORDER — PROMETHAZINE HCL 25 MG PO TABS: 12.5000 mg | ORAL_TABLET | Freq: Four times a day (QID) | ORAL | 0 refills | Status: DC | PRN

## 2017-08-04 NOTE — Progress Notes (Signed)
   GYN VISIT Patient name: Ruth Dunlap MRN [id number]  Date of birth: Nov 24, 1995 Chief Complaint:   Initial Prenatal Visit (vomiting)  History of Present Illness:   Ruth Dunlap is a 22 y.o. [phone] African American female at [redacted]w[redacted]d by LMP c/w 8wk u/s, being seen today initially for new ob, however she plans to terminate. Told RN she went to ED in Dec and was told she may be having a miscarriage, called clinic already to schedule EAB and they told her to verify with [place] if baby still viable. +N/V, requests meds.      Patient's last menstrual period was 05/18/2017 (approximate). The current method of family planning is none. Last pap 11/05/16. 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  Pertinent History Reviewed:  Reviewed past medical,surgical, social, obstetrical and family history.  Reviewed problem list, medications and allergies. Physical Assessment:   Vitals:   08/04/17 1347  BP: 120/68  Pulse: 68  Weight: 160 lb (72.6 kg)  Body mass index is 25.82 kg/m.       Physical Examination:   General appearance: alert, well appearing, and in no distress  Mental status: alert, oriented to person, place, and time  Skin: warm & dry   Cardiovascular: normal heart rate noted  Respiratory: normal respiratory effort, no distress  Abdomen: soft, non-tender   Pelvic: examination not indicated  Extremities: no edema   No results found for this or any previous visit (from the past 24 hour(s)).    Assessment & Plan:  1) [redacted]w[redacted]d SIUP> plans termination  2) H/O abnormal pap> w/ abnormal colpo/bx, needs LEEP  3) Contraception counseling> plans IUD after LEEP, brochure given, abstinence until after IUD  4) N/V> rx phenergan per request  Meds:  Meds ordered this encounter  Medications  . promethazine (PHENERGAN) 25 MG tablet    Sig: Take 0.5-1 tablets (12.5-25 mg total) by mouth every 6 (six) hours as needed for nausea or vomiting.    Dispense:  30 tablet    Refill:  0    Order Specific Question:   Supervising Provider    Answer:   Elonda Husky, LUTHER H [[id number]]    No orders of the defined types were placed in this encounter.   Return in about 2 weeks (around 08/18/2017) for F/U w/ JVF for LEEP.  Tawnya Crook CNM, [facility] 08/04/2017 4:22 PM

## 2017-08-04 NOTE — Patient Instructions (Signed)
Loop Electrosurgical Excision Procedure  Loop electrosurgical excision procedure (LEEP) is the cutting and removal (excision) of part of the cervix. The cervix is the bottom part of the uterus that opens into the vagina. The tissue that is removed from the cervix is then examined to see if there are precancerous cells or cancer cells present. LEEP may be done when:  · You have abnormal bleeding from your cervix.  · You have an abnormal Pap test result.  · Your health care provider finds an abnormality on your cervix during a pelvic exam.    LEEP typically only takes a few minutes and is often done in the health care provider's office. The procedure is safe for women who are trying to get pregnant. However, the procedure is usually not done during a menstrual period or during pregnancy.  Tell a health care provider about:  · Any allergies you have.  · All medicines you are taking, including vitamins, herbs, eye drops, creams, and over-the-counter medicines.  · Any problems you or family members have had with anesthetic medicines.  · Any blood disorders you have.  · Any surgeries you have had.  · Any medical conditions you have, including current or past vaginal infections, such as herpes or sexually transmitted infections (STIs).  · Whether you are pregnant or may be pregnant.  What are the risks?  Generally, this is a safe procedure. However, problems may occur, including:  · Infection.  · Bleeding.  · Allergic reactions to medicines.  · Changes or scarring in the cervix.  · Increased risk of early (preterm) labor in future pregnancies.    What happens before the procedure?  · Ask your health care provider about:  ? Changing or stopping your regular medicines. This is especially important if you are taking diabetes medicines or blood thinners.  ? Taking medicines such as aspirin and ibuprofen. These medicines can thin your blood. Do not take these medicines before your procedure if your health care provider  instructs you not to.  · Your health care provider may recommend that you take pain medicine before the procedure.  · Plan to have someone take you home after the procedure.  What happens during the procedure?  · An instrument called a speculum will be placed in your vagina. This will allow your health care provider to see the cervix.  · You will be given a medicine to numb the area (local anesthetic). The medicine will be injected into your cervix and the surrounding area.  · A solution will be applied to your cervix. This solution will help the health care provider find the abnormal cells that need to be removed.  · A thin wire loop will be passed through your vagina. The wire will be used to burn (cauterize) the cervical tissue with an electrical current.  · The abnormal cervical tissue will be removed.  · Any open blood vessels will be cauterized to prevent bleeding.  · A paste may be applied to the cauterized area of your cervix to help prevent bleeding.  · The sample of cervical tissue will be examined under a microscope.  The procedure may vary among health care providers and hospitals.  What happens after the procedure?  · You may have mild abdominal cramping.  · You may have a small amount of bleeding (spotting) from the vagina.  · You may have a dark-colored discharge coming from your vagina. This is from the paste that was used on the cervix to   prevent bleeding.  · Your health care provider may recommend pelvic rest. Pelvic rest generally means avoiding sex and not putting anything in the vagina, such as tampons, creams, and douches.  · It is your responsibility to get your test results. Ask your health care provider or the department performing the test when your results will be ready.  This information is not intended to replace advice given to you by your health care provider. Make sure you discuss any questions you have with your health care provider.  Document Released: 10/03/2002 Document Revised:  08/09/2015 Document Reviewed: 05/27/2015  Elsevier Interactive Patient Education © 2018 [employer]

## 2017-08-11 ENCOUNTER — Telehealth: Payer: Self-pay | Admitting: Obstetrics & Gynecology

## 2017-08-11 NOTE — Telephone Encounter (Signed)
Patient states she is having a yellow discharge and itching. Wanting to know if urine was sent. Informed urine tests were cancelled since she was terminating pregnancy. Advised she could try Monitstat or generic if she thought it was a yeast infection or would need to be seen. Verbalized understanding.

## 2017-08-12 ENCOUNTER — Emergency Department (HOSPITAL_COMMUNITY): Payer: Medicaid Other

## 2017-08-12 ENCOUNTER — Encounter (HOSPITAL_COMMUNITY): Payer: Self-pay | Admitting: Emergency Medicine

## 2017-08-12 ENCOUNTER — Emergency Department (HOSPITAL_COMMUNITY)
Admission: EM | Admit: 2017-08-12 | Discharge: 2017-08-12 | Disposition: A | Discharge status: Home / Self Care | Payer: Medicaid Other | Attending: Emergency Medicine | Admitting: Emergency Medicine

## 2017-08-12 ENCOUNTER — Other Ambulatory Visit: Payer: Self-pay

## 2017-08-12 DIAGNOSIS — Z3A12 12 weeks gestation of pregnancy: Secondary | ICD-10-CM | POA: Diagnosis not present

## 2017-08-12 DIAGNOSIS — R102 Pelvic and perineal pain: Secondary | ICD-10-CM

## 2017-08-12 DIAGNOSIS — O26812 Pregnancy related exhaustion and fatigue, second trimester: Secondary | ICD-10-CM | POA: Diagnosis present

## 2017-08-12 DIAGNOSIS — R1013 Epigastric pain: Secondary | ICD-10-CM | POA: Diagnosis not present

## 2017-08-12 DIAGNOSIS — O26899 Other specified pregnancy related conditions, unspecified trimester: Secondary | ICD-10-CM | POA: Insufficient documentation

## 2017-08-12 DIAGNOSIS — A5901 Trichomonal vulvovaginitis: Secondary | ICD-10-CM | POA: Insufficient documentation

## 2017-08-12 DIAGNOSIS — R42 Dizziness and giddiness: Secondary | ICD-10-CM | POA: Insufficient documentation

## 2017-08-12 DIAGNOSIS — O98312 Other infections with a predominantly sexual mode of transmission complicating pregnancy, second trimester: Secondary | ICD-10-CM | POA: Insufficient documentation

## 2017-08-12 DIAGNOSIS — N898 Other specified noninflammatory disorders of vagina: Secondary | ICD-10-CM | POA: Diagnosis not present

## 2017-08-12 DIAGNOSIS — R10816 Epigastric abdominal tenderness: Secondary | ICD-10-CM | POA: Insufficient documentation

## 2017-08-12 LAB — URINALYSIS, ROUTINE W REFLEX MICROSCOPIC
BILIRUBIN URINE: NEGATIVE
GLUCOSE, UA: NEGATIVE mg/dL
HGB URINE DIPSTICK: NEGATIVE
Ketones, ur: NEGATIVE mg/dL
NITRITE: NEGATIVE
PROTEIN: 30 mg/dL — AB
Specific Gravity, Urine: 1.026 (ref 1.005–1.030)
pH: 6 (ref 5.0–8.0)

## 2017-08-12 LAB — CBC
HEMATOCRIT: 35.5 % — AB (ref 36.0–46.0)
Hemoglobin: 11.1 g/dL — ABNORMAL LOW (ref 12.0–15.0)
MCH: 25.3 pg — ABNORMAL LOW (ref 26.0–34.0)
MCHC: 31.3 g/dL (ref 30.0–36.0)
MCV: 80.9 fL (ref 78.0–100.0)
Platelets: 256 10*3/uL (ref 150–400)
RBC: 4.39 MIL/uL (ref 3.87–5.11)
RDW: 14.3 % (ref 11.5–15.5)
WBC: 9 10*3/uL (ref 4.0–10.5)

## 2017-08-12 LAB — COMPREHENSIVE METABOLIC PANEL
ALBUMIN: 3.2 g/dL — AB (ref 3.5–5.0)
ALT: 9 U/L — ABNORMAL LOW (ref 14–54)
AST: 14 U/L — ABNORMAL LOW (ref 15–41)
Alkaline Phosphatase: 50 U/L (ref 38–126)
Anion gap: 8 (ref 5–15)
BILIRUBIN TOTAL: 0.2 mg/dL — AB (ref 0.3–1.2)
BUN: 11 mg/dL (ref 6–20)
CO2: 22 mmol/L (ref 22–32)
Calcium: 9.1 mg/dL (ref 8.9–10.3)
Chloride: 104 mmol/L (ref 101–111)
Creatinine, Ser: 0.73 mg/dL (ref 0.44–1.00)
GFR calc Af Amer: >60 mL/min (ref >60)
GLUCOSE: 85 mg/dL (ref 65–99)
POTASSIUM: 3.8 mmol/L (ref 3.5–5.1)
Sodium: 134 mmol/L — ABNORMAL LOW (ref 135–145)
TOTAL PROTEIN: 6.8 g/dL (ref 6.5–8.1)

## 2017-08-12 LAB — WET PREP, GENITAL
Clue Cells Wet Prep HPF POC: NONE SEEN
Sperm: NONE SEEN
YEAST WET PREP: NONE SEEN

## 2017-08-12 LAB — LIPASE, BLOOD: Lipase: 27 U/L (ref 11–51)

## 2017-08-12 MED ORDER — SODIUM CHLORIDE 0.9 % IV BOLUS (SEPSIS)
1000.0000 mL | Freq: Once | INTRAVENOUS | Status: AC
  Administered 2017-08-12: 1000 mL via INTRAVENOUS

## 2017-08-12 MED ORDER — METRONIDAZOLE 500 MG PO TABS: 500.0000 mg | ORAL_TABLET | Freq: Two times a day (BID) | ORAL | 0 refills | Status: DC

## 2017-08-12 MED ORDER — METRONIDAZOLE 500 MG PO TABS
500.0000 mg | ORAL_TABLET | Freq: Once | ORAL | Status: AC
  Administered 2017-08-12: 500 mg via ORAL
  Filled 2017-08-12: qty 1

## 2017-08-12 MED ORDER — FAMOTIDINE IN NACL 20-0.9 MG/50ML-% IV SOLN
20.0000 mg | Freq: Once | INTRAVENOUS | Status: AC
  Administered 2017-08-12: 20 mg via INTRAVENOUS
  Filled 2017-08-12: qty 50

## 2017-08-12 NOTE — Discharge Instructions (Signed)
Take the prescription as directed.  Increase your fluids for the next few days. Avoid strenuous activity and do NOT place anything into your vagina, ie: no douching, no tampons, no sexual intercourse, no swimming or tub baths, until you are seen in follow up by your regular OB/GYN doctor.  Call your regular OB/GYN doctor tomorrow morning to schedule a follow up appointment in the next 2 days.  Return to the Emergency Department immediately if worsening.

## 2017-08-12 NOTE — ED Triage Notes (Signed)
Patient states she is [redacted] weeks pregnant and feels lightheaded this morning. She states she usually has morning sickness. Denies nausea/vomiting today. Pt is followed by Dr. Glo Herring at [facility].

## 2017-08-12 NOTE — ED Notes (Signed)
Patient lying in bed sleeping at this time. No episodes of vomiting since arrival to ER.
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
## 2017-08-13 LAB — GC/CHLAMYDIA PROBE AMP (~~LOC~~) NOT AT ARMC
CHLAMYDIA, DNA PROBE: NEGATIVE
NEISSERIA GONORRHEA: NEGATIVE

## 2017-08-14 LAB — URINE CULTURE

## 2017-08-19 ENCOUNTER — Encounter: Payer: Medicaid Other | Admitting: Obstetrics and Gynecology

## 2017-10-04 ENCOUNTER — Other Ambulatory Visit (HOSPITAL_COMMUNITY)
Admission: RE | Admit: 2017-10-04 | Discharge: 2017-10-04 | Disposition: A | Discharge status: Home / Self Care | Payer: Medicaid Other | Source: Ambulatory Visit | Attending: Obstetrics and Gynecology | Admitting: Obstetrics and Gynecology

## 2017-10-04 ENCOUNTER — Ambulatory Visit: Payer: Medicaid Other | Admitting: Obstetrics and Gynecology

## 2017-10-04 ENCOUNTER — Encounter: Payer: Self-pay | Admitting: Obstetrics and Gynecology

## 2017-10-04 VITALS — BP 120/60 | HR 95 | Wt 175.0 lb

## 2017-10-04 DIAGNOSIS — Z124 Encounter for screening for malignant neoplasm of cervix: Secondary | ICD-10-CM

## 2017-10-04 DIAGNOSIS — B9689 Other specified bacterial agents as the cause of diseases classified elsewhere: Secondary | ICD-10-CM | POA: Diagnosis not present

## 2017-10-04 DIAGNOSIS — N760 Acute vaginitis: Secondary | ICD-10-CM

## 2017-10-04 DIAGNOSIS — Z3202 Encounter for pregnancy test, result negative: Secondary | ICD-10-CM | POA: Diagnosis not present

## 2017-10-04 LAB — POCT URINE PREGNANCY: PREG TEST UR: NEGATIVE

## 2017-10-04 MED ORDER — METRONIDAZOLE 500 MG PO TABS: 500.0000 mg | ORAL_TABLET | Freq: Two times a day (BID) | ORAL | 0 refills | Status: DC
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
## 2017-10-05 ENCOUNTER — Telehealth: Payer: Self-pay | Admitting: Obstetrics and Gynecology

## 2017-10-05 MED ORDER — METRONIDAZOLE 500 MG PO TABS: 500.0000 mg | ORAL_TABLET | Freq: Two times a day (BID) | ORAL | 0 refills | Status: DC

## 2017-10-05 NOTE — Telephone Encounter (Signed)
Informed patient prescription was sent to pharmacy. 

## 2017-10-05 NOTE — Telephone Encounter (Signed)
Patient called stating that she came in to see Dr.Ferguson yesterday and he was suppose to sent a medication over to the [facility] on [address]. Pt would like to know if the Doctor could send in her medication. Please contact pt

## 2017-10-07 LAB — CYTOLOGY - PAP: DIAGNOSIS: HIGH — AB

## 2017-10-11 ENCOUNTER — Telehealth: Payer: Self-pay | Admitting: *Deleted

## 2017-10-11 NOTE — Telephone Encounter (Signed)
LMOVM to return call.

## 2017-10-19 ENCOUNTER — Telehealth: Payer: Self-pay | Admitting: *Deleted

## 2017-10-19 ENCOUNTER — Other Ambulatory Visit: Payer: Self-pay

## 2017-10-19 ENCOUNTER — Ambulatory Visit (INDEPENDENT_AMBULATORY_CARE_PROVIDER_SITE_OTHER): Payer: Medicaid Other | Admitting: *Deleted

## 2017-10-19 DIAGNOSIS — Z3202 Encounter for pregnancy test, result negative: Secondary | ICD-10-CM

## 2017-10-19 DIAGNOSIS — Z3042 Encounter for surveillance of injectable contraceptive: Secondary | ICD-10-CM | POA: Diagnosis not present

## 2017-10-19 LAB — POCT URINE PREGNANCY: PREG TEST UR: NEGATIVE

## 2017-10-19 MED ORDER — MEDROXYPROGESTERONE ACETATE 150 MG/ML IM SUSP
150.0000 mg | Freq: Once | INTRAMUSCULAR | Status: AC
  Administered 2017-10-19: 150 mg via INTRAMUSCULAR

## 2017-10-19 NOTE — Telephone Encounter (Signed)
Pt's Depo wasn't ordered so I called in Depo Provera 150 mg injection #1 inject at office every 12 weeks with 3 refills per Dr. Elonda Husky. [place]

## 2017-10-19 NOTE — Progress Notes (Signed)
Pt given DepoProvera 150mg  IM left deltoid. Pt currently on period with neg UPT. Advised to return in 12 weeks for next injection.

## 2017-10-25 ENCOUNTER — Ambulatory Visit: Payer: Medicaid Other

## 2017-10-26 ENCOUNTER — Encounter: Payer: Self-pay | Admitting: Obstetrics & Gynecology

## 2017-10-26 ENCOUNTER — Other Ambulatory Visit: Payer: Self-pay | Admitting: Obstetrics & Gynecology

## 2017-10-26 ENCOUNTER — Ambulatory Visit: Payer: Medicaid Other | Admitting: Obstetrics & Gynecology

## 2017-10-26 VITALS — BP 122/66 | HR 80 | Ht 66.0 in | Wt 175.0 lb

## 2017-10-26 DIAGNOSIS — R87613 High grade squamous intraepithelial lesion on cytologic smear of cervix (HGSIL): Secondary | ICD-10-CM

## 2017-10-26 DIAGNOSIS — Z3202 Encounter for pregnancy test, result negative: Secondary | ICD-10-CM

## 2017-10-26 DIAGNOSIS — R87810 Cervical high risk human papillomavirus (HPV) DNA test positive: Secondary | ICD-10-CM

## 2017-10-26 LAB — POCT URINE PREGNANCY: PREG TEST UR: NEGATIVE

## 2017-10-26 NOTE — Progress Notes (Signed)
Colposcopy Procedure Note:  Colposcopy Procedure Note  Indications: Pap smear 1 months ago showed: high-grade squamous intraepithelial neoplasia  (HGSIL-encompassing moderate and severe dysplasia). The prior pap showed ASCUS with POSITIVE high risk HPV.  Prior cervical/vaginal disease: normal exam without visible pathology. Prior cervical treatment: no treatment.  Smoker:  No. New sexual partner:  No.    History of abnormal Pap: yes  Procedure Details  The risks and benefits of the procedure and Written informed consent obtained.  Speculum placed in vagina and excellent visualization of cervix achieved, cervix swabbed x 3 with acetic acid solution.  Findings:adequate colposcopy Cervix: dense acetowhite changes with punctation mosaicism no abnormal vessels; SCJ visualized - lesion at 12 o'clock. Vaginal inspection: normal without visible lesions. Vulvar colposcopy: vulvar colposcopy not performed.  Specimens: cervical biospy  Complications: none.  Plan: Specimens labelled and sent to Pathology. Will base further treatment on Pathology findings. Return to discuss Pathology results in 1 week.   Would require laser ablation for treatment

## 2017-10-26 NOTE — Addendum Note (Signed)
Addended by: Christiana Pellant A on: 10/26/2017 01:42 PM   Modules accepted: Orders

## 2017-11-02 ENCOUNTER — Encounter: Payer: Self-pay | Admitting: Obstetrics & Gynecology

## 2017-11-02 ENCOUNTER — Ambulatory Visit: Payer: Medicaid Other | Admitting: Obstetrics & Gynecology

## 2017-11-02 VITALS — BP 120/60 | HR 80 | Ht 66.0 in | Wt 174.4 lb

## 2017-11-02 DIAGNOSIS — R87613 High grade squamous intraepithelial lesion on cytologic smear of cervix (HGSIL): Secondary | ICD-10-CM
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
## 2017-11-08 NOTE — Patient Instructions (Signed)
Ruth Dunlap  11/08/2017     @PREFPERIOPPHARMACY @   Your procedure is scheduled on  11/17/2017 .  Report to [name] at  930   A.M.  Call this number if you have problems the morning of surgery:  [phone]   Remember:  Do not eat food or drink liquids after midnight.  Take these medicines the morning of surgery with A SIP OF WATER  None   Do not wear jewelry, make-up or nail polish.  Do not wear lotions, powders, or perfumes, or deodorant.  Do not shave 48 hours prior to surgery.  Men may shave face and neck.  Do not bring valuables to the hospital.  [facility] is not responsible for any belongings or valuables.  Contacts, dentures or bridgework may not be worn into surgery.  Leave your suitcase in the car.  After surgery it may be brought to your room.  For patients admitted to the hospital, discharge time will be determined by your treatment team.  Patients discharged the day of surgery will not be allowed to drive home.   Name and phone number of your driver:   family Special instructions:  None  Please read over the following fact sheets that you were given. Anesthesia Post-op Instructions and Care and Recovery After Surgery       Cervical Conization Cervical conization (cone biopsy) is a procedure in which a cone-shaped portion of the cervix is cut out so that it can be examined under a microscope. The procedure is done to check for cancer cells or cells that might turn into cancer (precancerous cells). You may have this procedure if:  You have abnormal bleeding from your cervix.  You had an abnormal Pap test.  Something abnormal was seen on your cervix during an exam.  This procedure is performed in either a health care provider's office or in an operating room. Tell a health care provider about:  Any allergies you have.  All medicines you are taking, including vitamins, herbs, eye drops, creams, and over-the-counter medicines.  Any  problems you or family members have had with the use of anesthetic medicines.  Any blood disorders you have.  Any surgeries you have had.  Any medical conditions you have.  Your smoking habits.  When you normally have your period.  Whether you are pregnant or may be pregnant. What are the risks? Generally, this is a safe procedure. However, problems may occur, including:  Heavy bleeding for several days or weeks after the procedure.  Allergic reactions to medicines or dyes.  Increased risk of preterm labor in future pregnancies.  Infection (rare).  Damage to the cervix or other structures or organs (rare).  What happens before the procedure? Staying hydrated Follow instructions from your health care provider about hydration, which may include:  Up to 2 hours before the procedure - you may continue to drink clear liquids, such as water, clear fruit juice, black coffee, and plain tea.  Eating and drinking restrictions Follow instructions from your health care provider about eating and drinking, which may include:  8 hours before the procedure - stop eating heavy meals or foods such as meat, fried foods, or fatty foods.  6 hours before the procedure - stop eating light meals or foods, such as toast or cereal.  6 hours before the procedure - stop drinking milk or drinks that contain milk.  2 hours before the procedure - stop drinking  clear liquids.  General instructions  Do not douche, have sex, use tampons, or use any vaginal medicines before the procedure as told by your health care provider.  You may be asked to empty your bladder and bowel right before the procedure.  Ask your health care provider about: ? Changing or stopping your normal medicines. This is important if you take diabetes medicines or blood thinners. ? Taking medicines such as aspirin and ibuprofen. These medicines can thin your blood. Do not take these medicines before your procedure if your doctor  tells you not to.  Plan to have someone take you home from the hospital or clinic. What happens during the procedure?  To reduce your risk of infection: ? Your health care team will wash or sanitize their hands. ? Your skin will be washed with soap. ? Hair may be removed from the surgical area.  You will undress from the waist down and be given a gown to wear.  You will lie on an examining table and put your feet in stirrups.  An IV tube will be inserted into one of your veins.  You will be given one or more of the following: ? A medicine to help you relax (sedative). ? A medicine to numb the area (local anesthetic). ? A medicine to make you fall asleep (general anesthetic). ? A medicine that numbs the cervix (cervical block).  A lubricated device called a speculum will be inserted into your vagina. It will be used to spread open the walls of the vagina so your health care provider can see the inside of the vagina and cervix better.  An instrument that has a magnifying lens and a light (colposcope) will let your health care provider examine the cervix more closely.  Your health care provider will apply a solution to your cervix. This turns abnormal areas a pale color.  A tissue sample will be removed from the cervix using one of the following methods: ? The cold knife method. In this method, the tissue is cut out with a knife (scalpel). ? The loop electrosurgical excision procedure (LEEP) method. In this method, the tissue is cut out with a thin wire that can burn (cauterize) the tissue with an electrical current. ? Laser treatment method. In this method, the tissue is cut out and then cauterized with a laser beam to prevent bleeding.  Your health care provider will apply a paste over the biopsy areas to help control bleeding.  The tissue sample will be examined under a microscope. The procedure may vary among health care providers and hospitals. What happens after the  procedure?  Your blood pressure, heart rate, breathing rate, and blood oxygen level will be monitored often until the medicines you were given have worn off.  If you were given a local anesthetic, you will rest at the clinic or hospital until you are stable and feel ready to go home.  If you were given a general anesthetic, you may be monitored for a longer period of time.  You may have some cramping.  You may have bloody discharge or light to moderate bleeding.  You may have dark discharge coming from your vagina. This is from the paste used on the cervix to prevent bleeding. Summary  Cervical conization is a procedure in which a cone-shaped portion of the cervix is cut out so that it can be examined under a microscope.  The procedure is done to check for cancer cells or cells that might turn into  cancer (precancerous cells). This information is not intended to replace advice given to you by your health care provider. Make sure you discuss any questions you have with your health care provider. Document Released: 04/22/2005 Document Revised: 07/15/2016 Document Reviewed: 07/15/2016 Elsevier Interactive Patient Education  2017 [employer]  Cervical Conization, Care After This sheet gives you information about how to care for yourself after your procedure. Your doctor may also give you more specific instructions. If you have problems or questions, contact your doctor. Follow these instructions at home: Medicines  Take over-the-counter and prescription medicines only as told by your doctor.  Do not take aspirin until your doctor says it is okay.  If you take pain medicine: ? You may have constipation. To help treat this, your doctor may tell you to:  Drink enough fluid to keep your pee (urine) clear or pale yellow.  Take medicines.  Eat foods that are high in fiber. These include fresh fruits and vegetables, whole grains, bran, and beans.  Limit foods that are high in fat and  sugar. These include fried foods and sweet foods. ? Do not drive or use heavy machines. General instructions  You can eat your usual diet unless your doctor tells you not to do so.  Take showers for the first week. Do not take baths, swim, or use hot tubs until your doctor says it is okay.  Do not douche, use tampons, or have sex until your doctor says it is okay.  For 7-14 days after your procedure, avoid: ? Being very active. ? Exercising. ? Heavy lifting.  Keep all follow-up visits as told by your doctor. This is important. Contact a doctor if:  You have a rash.  You are dizzy or lightheaded.  You feel sick to your stomach (nauseous).  You throw up (vomit).  You have fluid from your vagina (vaginal discharge) that smells bad. Get help right away if:  There are blood clots coming from your vagina.  You have more bleeding than you would have in a normal period. For example, you soak a pad in less than 1 hour.  You have a fever.  You have more and more cramps.  You pass out (faint).  You have pain when peeing.  Your have a lot of pain.  Your pain gets worse.  Your pain does not get better when you take your medicine.  You have blood in your pee.  You throw up (vomit). Summary  After your procedure, take over-the-counter and prescription medicines only as told by your doctor.  Do not douche, use tampons, or have sex until your doctor says it is okay.  For about 7-14 days after your procedure, try not to exercise or lift heavy objects.  Get help right away if you have new symptoms, or if your symptoms become worse. This information is not intended to replace advice given to you by your health care provider. Make sure you discuss any questions you have with your health care provider. Document Released: 04/21/2008 Document Revised: 07/15/2016 Document Reviewed: 07/15/2016 Elsevier Interactive Patient Education  2017 [place] Anesthesia,  Adult General anesthesia is the use of medicines to make a person "go to sleep" (be unconscious) for a medical procedure. General anesthesia is often recommended when a procedure:  Is long.  Requires you to be still or in an unusual position.  Is major and can cause you to lose blood.  Is impossible to do without general anesthesia.  The medicines  used for general anesthesia are called general anesthetics. In addition to making you sleep, the medicines:  Prevent pain.  Control your blood pressure.  Relax your muscles.  Tell a health care provider about:  Any allergies you have.  All medicines you are taking, including vitamins, herbs, eye drops, creams, and over-the-counter medicines.  Any problems you or family members have had with anesthetic medicines.  Types of anesthetics you have had in the past.  Any bleeding disorders you have.  Any surgeries you have had.  Any medical conditions you have.  Any history of heart or lung conditions, such as heart failure, sleep apnea, or chronic obstructive pulmonary disease (COPD).  Whether you are pregnant or may be pregnant.  Whether you use tobacco, alcohol, marijuana, or street drugs.  Any history of Armed forces logistics/support/administrative officer.  Any history of depression or anxiety. What are the risks? Generally, this is a safe procedure. However, problems may occur, including:  Allergic reaction to anesthetics.  Lung and heart problems.  Inhaling food or liquids from your stomach into your lungs (aspiration).  Injury to nerves.  Waking up during your procedure and being unable to move (rare).  Extreme agitation or a state of mental confusion (delirium) when you wake up from the anesthetic.  Air in the bloodstream, which can lead to stroke.  These problems are more likely to develop if you are having a major surgery or if you have an advanced medical condition. You can prevent some of these complications by answering all of your health  care provider's questions thoroughly and by following all pre-procedure instructions. General anesthesia can cause side effects, including:  Nausea or vomiting  A sore throat from the breathing tube.  Feeling cold or shivery.  Feeling tired, washed out, or achy.  Sleepiness or drowsiness.  Confusion or agitation.  What happens before the procedure? Staying hydrated Follow instructions from your health care provider about hydration, which may include:  Up to 2 hours before the procedure - you may continue to drink clear liquids, such as water, clear fruit juice, black coffee, and plain tea.  Eating and drinking restrictions Follow instructions from your health care provider about eating and drinking, which may include:  8 hours before the procedure - stop eating heavy meals or foods such as meat, fried foods, or fatty foods.  6 hours before the procedure - stop eating light meals or foods, such as toast or cereal.  6 hours before the procedure - stop drinking milk or drinks that contain milk.  2 hours before the procedure - stop drinking clear liquids.  Medicines  Ask your health care provider about: ? Changing or stopping your regular medicines. This is especially important if you are taking diabetes medicines or blood thinners. ? Taking medicines such as aspirin and ibuprofen. These medicines can thin your blood. Do not take these medicines before your procedure if your health care provider instructs you not to. ? Taking new dietary supplements or medicines. Do not take these during the week before your procedure unless your health care provider approves them.  If you are told to take a medicine or to continue taking a medicine on the day of the procedure, take the medicine with sips of water. General instructions   Ask if you will be going home the same day, the following day, or after a longer hospital stay. ? Plan to have someone take you home. ? Plan to have someone  stay with you for the first 24  hours after you leave the hospital or clinic.  For 3-6 weeks before the procedure, try not to use any tobacco products, such as cigarettes, chewing tobacco, and e-cigarettes.  You may brush your teeth on the morning of the procedure, but make sure to spit out the toothpaste. What happens during the procedure?  You will be given anesthetics through a mask and through an IV tube in one of your veins.  You may receive medicine to help you relax (sedative).  As soon as you are asleep, a breathing tube may be used to help you breathe.  An anesthesia specialist will stay with you throughout the procedure. He or she will help keep you comfortable and safe by continuing to give you medicines and adjusting the amount of medicine that you get. He or she will also watch your blood pressure, pulse, and oxygen levels to make sure that the anesthetics do not cause any problems.  If a breathing tube was used to help you breathe, it will be removed before you wake up. The procedure may vary among health care providers and hospitals. What happens after the procedure?  You will wake up, often slowly, after the procedure is complete, usually in a recovery area.  Your blood pressure, heart rate, breathing rate, and blood oxygen level will be monitored until the medicines you were given have worn off.  You may be given medicine to help you calm down if you feel anxious or agitated.  If you will be going home the same day, your health care provider may check to make sure you can stand, drink, and urinate.  Your health care providers will treat your pain and side effects before you go home.  Do not drive for 24 hours if you received a sedative.  You may: ? Feel nauseous and vomit. ? Have a sore throat. ? Have mental slowness. ? Feel cold or shivery. ? Feel sleepy. ? Feel tired. ? Feel sore or achy, even in parts of your body where you did not have surgery. This  information is not intended to replace advice given to you by your health care provider. Make sure you discuss any questions you have with your health care provider. Document Released: 10/20/2007 Document Revised: 12/24/2015 Document Reviewed: 06/27/2015 Elsevier Interactive Patient Education  2018 [place] Anesthesia, Adult, Care After These instructions provide you with information about caring for yourself after your procedure. Your health care provider may also give you more specific instructions. Your treatment has been planned according to current medical practices, but problems sometimes occur. Call your health care provider if you have any problems or questions after your procedure. What can I expect after the procedure? After the procedure, it is common to have:  Vomiting.  A sore throat.  Mental slowness.  It is common to feel:  Nauseous.  Cold or shivery.  Sleepy.  Tired.  Sore or achy, even in parts of your body where you did not have surgery.  Follow these instructions at home: For at least 24 hours after the procedure:  Do not: ? Participate in activities where you could fall or become injured. ? Drive. ? Use heavy machinery. ? Drink alcohol. ? Take sleeping pills or medicines that cause drowsiness. ? Make important decisions or sign legal documents. ? Take care of children on your own.  Rest. Eating and drinking  If you vomit, drink water, juice, or soup when you can drink without vomiting.  Drink enough fluid to keep your  urine clear or pale yellow.  Make sure you have little or no nausea before eating solid foods.  Follow the diet recommended by your health care provider. General instructions  Have a responsible adult stay with you until you are awake and alert.  Return to your normal activities as told by your health care provider. Ask your health care provider what activities are safe for you.  Take over-the-counter and  prescription medicines only as told by your health care provider.  If you smoke, do not smoke without supervision.  Keep all follow-up visits as told by your health care provider. This is important. Contact a health care provider if:  You continue to have nausea or vomiting at home, and medicines are not helpful.  You cannot drink fluids or start eating again.  You cannot urinate after 8-12 hours.  You develop a skin rash.  You have fever.  You have increasing redness at the site of your procedure. Get help right away if:  You have difficulty breathing.  You have chest pain.  You have unexpected bleeding.  You feel that you are having a life-threatening or urgent problem. This information is not intended to replace advice given to you by your health care provider. Make sure you discuss any questions you have with your health care provider. Document Released: 10/19/2000 Document Revised: 12/16/2015 Document Reviewed: 06/27/2015 Elsevier Interactive Patient Education  [employer].

## 2017-11-10 ENCOUNTER — Encounter (HOSPITAL_COMMUNITY)
Admission: RE | Admit: 2017-11-10 | Discharge: 2017-11-10 | Disposition: A | Discharge status: Home / Self Care | Payer: Medicaid Other | Source: Ambulatory Visit | Attending: Obstetrics & Gynecology | Admitting: Obstetrics & Gynecology

## 2017-11-10 ENCOUNTER — Other Ambulatory Visit: Payer: Self-pay | Admitting: Obstetrics & Gynecology

## 2017-11-16 ENCOUNTER — Encounter (HOSPITAL_COMMUNITY)
Admission: RE | Admit: 2017-11-16 | Discharge: 2017-11-16 | Disposition: A | Discharge status: Home / Self Care | Payer: Medicaid Other | Source: Ambulatory Visit | Attending: Obstetrics & Gynecology | Admitting: Obstetrics & Gynecology

## 2017-11-16 ENCOUNTER — Encounter (HOSPITAL_COMMUNITY): Payer: Self-pay

## 2017-11-16 ENCOUNTER — Other Ambulatory Visit: Payer: Self-pay

## 2017-11-16 DIAGNOSIS — Z01812 Encounter for preprocedural laboratory examination: Secondary | ICD-10-CM | POA: Insufficient documentation

## 2017-11-16 LAB — URINALYSIS, ROUTINE W REFLEX MICROSCOPIC
Bacteria, UA: NONE SEEN
Bilirubin Urine: NEGATIVE
Glucose, UA: NEGATIVE mg/dL
Hgb urine dipstick: NEGATIVE
Ketones, ur: NEGATIVE mg/dL
Nitrite: NEGATIVE
Protein, ur: NEGATIVE mg/dL
Specific Gravity, Urine: 1.03 (ref 1.005–1.030)
pH: 6 (ref 5.0–8.0)

## 2017-11-16 LAB — COMPREHENSIVE METABOLIC PANEL
ALK PHOS: 50 U/L (ref 38–126)
ALT: 15 U/L (ref 14–54)
AST: 19 U/L (ref 15–41)
Albumin: 3.9 g/dL (ref 3.5–5.0)
Anion gap: 11 (ref 5–15)
BUN: 16 mg/dL (ref 6–20)
CALCIUM: 9.3 mg/dL (ref 8.9–10.3)
CHLORIDE: 105 mmol/L (ref 101–111)
CO2: 23 mmol/L (ref 22–32)
CREATININE: 0.87 mg/dL (ref 0.44–1.00)
GFR calc Af Amer: >60 mL/min (ref >60)
Glucose, Bld: 114 mg/dL — ABNORMAL HIGH (ref 65–99)
Potassium: 3.8 mmol/L (ref 3.5–5.1)
Sodium: 139 mmol/L (ref 135–145)
Total Bilirubin: 1 mg/dL (ref 0.3–1.2)
Total Protein: 7.9 g/dL (ref 6.5–8.1)

## 2017-11-16 LAB — HCG, QUANTITATIVE, PREGNANCY: hCG, Beta Chain, Quant, S: <1 m[IU]/mL (ref <5)

## 2017-11-16 LAB — CBC
HCT: 39 % (ref 36.0–46.0)
Hemoglobin: 12.3 g/dL (ref 12.0–15.0)
MCH: 25.7 pg — ABNORMAL LOW (ref 26.0–34.0)
MCHC: 31.5 g/dL (ref 30.0–36.0)
MCV: 81.6 fL (ref 78.0–100.0)
Platelets: 305 K/uL (ref 150–400)
RBC: 4.78 MIL/uL (ref 3.87–5.11)
RDW: 13.7 % (ref 11.5–15.5)
WBC: 7.3 K/uL (ref 4.0–10.5)

## 2017-11-17 ENCOUNTER — Ambulatory Visit (HOSPITAL_COMMUNITY): Payer: Medicaid Other | Admitting: Anesthesiology

## 2017-11-17 ENCOUNTER — Encounter (HOSPITAL_COMMUNITY)
Admission: RE | Disposition: A | Discharge status: Home / Self Care | Payer: Self-pay | Source: Ambulatory Visit | Attending: Obstetrics & Gynecology

## 2017-11-17 ENCOUNTER — Encounter (HOSPITAL_COMMUNITY): Payer: Self-pay

## 2017-11-17 ENCOUNTER — Ambulatory Visit (HOSPITAL_COMMUNITY)
Admission: RE | Admit: 2017-11-17 | Discharge: 2017-11-17 | Disposition: A | Discharge status: Home / Self Care | Payer: Medicaid Other | Source: Ambulatory Visit | Attending: Obstetrics & Gynecology | Admitting: Obstetrics & Gynecology

## 2017-11-17 DIAGNOSIS — F329 Major depressive disorder, single episode, unspecified: Secondary | ICD-10-CM | POA: Insufficient documentation

## 2017-11-17 DIAGNOSIS — Z888 Allergy status to other drugs, medicaments and biological substances status: Secondary | ICD-10-CM | POA: Insufficient documentation

## 2017-11-17 DIAGNOSIS — R87613 High grade squamous intraepithelial lesion on cytologic smear of cervix (HGSIL): Secondary | ICD-10-CM | POA: Insufficient documentation

## 2017-11-17 DIAGNOSIS — J9600 Acute respiratory failure, unspecified whether with hypoxia or hypercapnia: Secondary | ICD-10-CM

## 2017-11-17 DIAGNOSIS — D069 Carcinoma in situ of cervix, unspecified: Secondary | ICD-10-CM | POA: Diagnosis not present

## 2017-11-17 HISTORY — PX: CERVICAL ABLATION: SHX5771

## 2017-11-17 SURGERY — ABLATION, CERVIX
Anesthesia: General | Site: Vagina

## 2017-11-17 MED ORDER — SODIUM CHLORIDE 0.9 % IJ SOLN
INTRAMUSCULAR | Status: AC
  Filled 2017-11-17: qty 10

## 2017-11-17 MED ORDER — EPHEDRINE SULFATE 50 MG/ML IJ SOLN
INTRAMUSCULAR | Status: AC
  Filled 2017-11-17: qty 1

## 2017-11-17 MED ORDER — KETOROLAC TROMETHAMINE 30 MG/ML IJ SOLN
30.0000 mg | Freq: Once | INTRAMUSCULAR | Status: AC
  Administered 2017-11-17: 30 mg via INTRAVENOUS
  Filled 2017-11-17: qty 1

## 2017-11-17 MED ORDER — ARTIFICIAL TEARS OPHTHALMIC OINT
TOPICAL_OINTMENT | OPHTHALMIC | Status: AC
  Filled 2017-11-17: qty 3.5

## 2017-11-17 MED ORDER — KETOROLAC TROMETHAMINE 10 MG PO TABS: 10.0000 mg | ORAL_TABLET | Freq: Three times a day (TID) | ORAL | 0 refills | Status: DC | PRN

## 2017-11-17 MED ORDER — PROPOFOL 10 MG/ML IV BOLUS
INTRAVENOUS | Status: AC
Start: 2017-11-17 — End: 2017-11-17
  Filled 2017-11-17: qty 20

## 2017-11-17 MED ORDER — ONDANSETRON HCL 4 MG/2ML IJ SOLN
INTRAMUSCULAR | Status: DC | PRN
  Administered 2017-11-17: 4 mg via INTRAVENOUS

## 2017-11-17 MED ORDER — ONDANSETRON HCL 4 MG/2ML IJ SOLN
INTRAMUSCULAR | Status: AC
  Filled 2017-11-17: qty 4

## 2017-11-17 MED ORDER — FERRIC SUBSULFATE 259 MG/GM EX SOLN
CUTANEOUS | Status: AC
  Filled 2017-11-17: qty 8

## 2017-11-17 MED ORDER — HYDROCODONE-ACETAMINOPHEN 7.5-325 MG PO TABS
1.0000 | ORAL_TABLET | Freq: Once | ORAL | Status: AC | PRN
  Administered 2017-11-17: 1 via ORAL
  Filled 2017-11-17: qty 1

## 2017-11-17 MED ORDER — FERRIC SUBSULFATE 259 MG/GM EX SOLN
CUTANEOUS | Status: DC | PRN
  Administered 2017-11-17: 1

## 2017-11-17 MED ORDER — ONDANSETRON HCL 4 MG/2ML IJ SOLN: 4.0000 mg | Freq: Once | INTRAMUSCULAR | Status: DC | PRN

## 2017-11-17 MED ORDER — FENTANYL CITRATE (PF) 100 MCG/2ML IJ SOLN
INTRAMUSCULAR | Status: DC | PRN
  Administered 2017-11-17: 50 ug via INTRAVENOUS
  Administered 2017-11-17 (×2): 25 ug via INTRAVENOUS

## 2017-11-17 MED ORDER — KETOROLAC TROMETHAMINE 30 MG/ML IJ SOLN
30.0000 mg | Freq: Once | INTRAMUSCULAR | Status: AC | PRN
  Administered 2017-11-17: 30 mg via INTRAVENOUS
  Filled 2017-11-17: qty 1

## 2017-11-17 MED ORDER — MIDAZOLAM HCL 5 MG/5ML IJ SOLN
INTRAMUSCULAR | Status: DC | PRN
  Administered 2017-11-17: 2 mg via INTRAVENOUS

## 2017-11-17 MED ORDER — CEFAZOLIN SODIUM-DEXTROSE 2-4 GM/100ML-% IV SOLN
2.0000 g | INTRAVENOUS | Status: AC
  Administered 2017-11-17: 2 g via INTRAVENOUS
  Filled 2017-11-17: qty 100

## 2017-11-17 MED ORDER — FERRIC SUBSULFATE 259 MG/GM EX SOLN
CUTANEOUS | Status: DC | PRN
Start: 2017-11-17 — End: 2017-11-17

## 2017-11-17 MED ORDER — ACETIC ACID 4% SOLUTION
Status: DC | PRN
  Administered 2017-11-17: 1 via TOPICAL

## 2017-11-17 MED ORDER — FENTANYL CITRATE (PF) 100 MCG/2ML IJ SOLN
INTRAMUSCULAR | Status: AC
  Filled 2017-11-17: qty 2

## 2017-11-17 MED ORDER — GLYCOPYRROLATE 0.2 MG/ML IJ SOLN
INTRAMUSCULAR | Status: AC
  Filled 2017-11-17: qty 4

## 2017-11-17 MED ORDER — MEPERIDINE HCL 50 MG/ML IJ SOLN: 6.2500 mg | INTRAMUSCULAR | Status: DC | PRN

## 2017-11-17 MED ORDER — DEXAMETHASONE SODIUM PHOSPHATE 4 MG/ML IJ SOLN
INTRAMUSCULAR | Status: DC | PRN
  Administered 2017-11-17: 4 mg via INTRAVENOUS

## 2017-11-17 MED ORDER — LIDOCAINE HCL (PF) 1 % IJ SOLN
INTRAMUSCULAR | Status: AC
  Filled 2017-11-17: qty 5

## 2017-11-17 MED ORDER — LIDOCAINE HCL (CARDIAC) PF 100 MG/5ML IV SOSY
PREFILLED_SYRINGE | INTRAVENOUS | Status: DC | PRN
  Administered 2017-11-17: 40 mg via INTRAVENOUS

## 2017-11-17 MED ORDER — LACTATED RINGERS IV SOLN
INTRAVENOUS | Status: DC
  Administered 2017-11-17: 10:00:00 via INTRAVENOUS

## 2017-11-17 MED ORDER — HYDROCODONE-ACETAMINOPHEN 5-325 MG PO TABS: 1.0000 | ORAL_TABLET | Freq: Four times a day (QID) | ORAL | 0 refills | Status: DC | PRN

## 2017-11-17 MED ORDER — STERILE WATER FOR IRRIGATION IR SOLN
Status: DC | PRN
  Administered 2017-11-17: 1000 mL

## 2017-11-17 MED ORDER — MIDAZOLAM HCL 2 MG/2ML IJ SOLN
INTRAMUSCULAR | Status: AC
  Filled 2017-11-17: qty 2

## 2017-11-17 MED ORDER — ONDANSETRON 8 MG PO TBDP: 8.0000 mg | ORAL_TABLET | Freq: Three times a day (TID) | ORAL | 0 refills | Status: DC | PRN

## 2017-11-17 MED ORDER — HYDROMORPHONE HCL 1 MG/ML IJ SOLN
0.2500 mg | INTRAMUSCULAR | Status: DC | PRN
  Administered 2017-11-17: 0.5 mg via INTRAVENOUS
  Filled 2017-11-17: qty 0.5

## 2017-11-17 MED ORDER — PROPOFOL 10 MG/ML IV BOLUS
INTRAVENOUS | Status: DC | PRN
  Administered 2017-11-17: 160 mg via INTRAVENOUS

## 2017-11-17 SURGICAL SUPPLY — 25 items
APL FBRTP 16 NS LF PRCTSCP (MISCELLANEOUS) ×2
BAG HAMPER (MISCELLANEOUS) ×3 IMPLANT
CLOTH BEACON ORANGE TIMEOUT ST (SAFETY) ×3 IMPLANT
COVER LIGHT HANDLE STERIS (MISCELLANEOUS) ×6 IMPLANT
COVER TABLE BACK 60X90 (DRAPES) ×3 IMPLANT
GAUZE SPONGE 4X4 16PLY XRAY LF (GAUZE/BANDAGES/DRESSINGS) ×3 IMPLANT
GLOVE BIOGEL PI IND STRL 7.0 (GLOVE) ×1 IMPLANT
GLOVE BIOGEL PI IND STRL 8 (GLOVE) ×1 IMPLANT
GLOVE BIOGEL PI INDICATOR 7.0 (GLOVE) ×2
GLOVE BIOGEL PI INDICATOR 8 (GLOVE) ×2
GLOVE ECLIPSE 6.5 STRL STRAW (GLOVE) ×2 IMPLANT
GLOVE ECLIPSE 8.0 STRL XLNG CF (GLOVE) ×3 IMPLANT
GOWN STRL REUS W/TWL LRG LVL3 (GOWN DISPOSABLE) ×3 IMPLANT
GOWN STRL REUS W/TWL XL LVL3 (GOWN DISPOSABLE) ×3 IMPLANT
KIT TURNOVER KIT A (KITS) ×3 IMPLANT
LASER FIBER DISP 1000U (UROLOGICAL SUPPLIES) ×3 IMPLANT
MANIFOLD NEPTUNE II (INSTRUMENTS) ×3 IMPLANT
MARKER SKIN DUAL TIP RULER LAB (MISCELLANEOUS) ×3 IMPLANT
PAD ARMBOARD 7.5X6 YLW CONV (MISCELLANEOUS) ×3 IMPLANT
PREFILTER SMOKE EVAC (FILTER) ×3 IMPLANT
SET BASIN LINEN APH (SET/KITS/TRAYS/PACK) ×3 IMPLANT
SHEET LAVH (DRAPES) ×3 IMPLANT
SWAB PROCTOSCOPIC (MISCELLANEOUS) ×6 IMPLANT
TUBING SMOKE EVAC CO2 (TUBING) ×3 IMPLANT
WATER STERILE IRR 1000ML POUR (IV SOLUTION) ×6 IMPLANT

## 2017-11-17 NOTE — Transfer of Care (Signed)
Immediate Anesthesia Transfer of Care Note  Patient: [name]  Procedure(s) Performed: Laser Ablation of Cervix (N/A Vagina )  Patient Location: PACU  Anesthesia Type:General  Level of Consciousness: awake, oriented and patient cooperative  Airway & Oxygen Therapy: Patient Spontanous Breathing and Patient connected to face mask oxygen  Post-op Assessment: Report given to RN and Post -op Vital signs reviewed and stable  Post vital signs: Reviewed and stable  Last Vitals:  Vitals Value Taken Time  BP    Temp    Pulse 94 11/17/2017 10:40 AM  Resp    SpO2 100 % 11/17/2017 10:40 AM  Vitals shown include unvalidated device data.  Last Pain:  Vitals:   11/17/17 0825  TempSrc: Oral  PainSc: 0-No pain      Patients Stated Pain Goal: 7 (05/26/58 4585)  Complications: No apparent anesthesia complications

## 2017-11-17 NOTE — Op Note (Signed)
Preoperative Diagnosis:  High Grade Squamous Intraepithelial lesion, adequate colposcopy  Postoperative Diagnosis:  Same as above  Procedure:  Laser ablation of the cervix  Surgeon:  Florian Buff MD  Anaesthesia:  Laryngeal Mask Airway  Findings:  Patient had an abnormal pap smear which was evaluated in the office with colposcopy and directed biopsies.  Pathology report returned as high Grade SIL.  The colposcopy was adequate.  As a result, the patient is admitted for laser ablation of the cervix.  Description of Note:  Patient was taken to the OR and placed in the supine position where she underwent laryngeal mask airway anaesthesia.  She was placed in the dorsal lithotomy position.  She was draped for laser.  Graves speculum was placed and 3% acetic acid used and the laser microscope employed to perform colposcopy which confirmed the office findings.  Laser was used on typical cervical settings and used to vaporized the squamocolumnar junction to  depth of  5-7 mm peripherally and 7-9 mm centrally.  Surgical margin of several mm was employed beyond the acetowhite epithelium.  Hemostasis was achieved with the laser and Monsel's solution.  Patient was awakened from anaesthesia in good stable condition and all counts were correct.  She received Ancef 2 gram and Toradol 30 mg IV preoperatively prophylactically.   Florian Buff, MD 11/17/2017 10:34 AM

## 2017-11-17 NOTE — Anesthesia Procedure Notes (Signed)
Procedure Name: LMA Insertion Date/Time: 11/17/2017 10:03 AM Performed by: Adams, Amy A, CRNA Pre-anesthesia Checklist: Patient identified, Emergency Drugs available, Suction available, Patient being monitored and Timeout performed Patient Re-evaluated:Patient Re-evaluated prior to induction Oxygen Delivery Method: Circle system utilized Preoxygenation: Pre-oxygenation with 100% oxygen Induction Type: IV induction Ventilation: Mask ventilation without difficulty LMA: LMA inserted LMA Size: 4.0 Number of attempts: 1 Dental Injury: Teeth and Oropharynx as per pre-operative assessment

## 2017-11-17 NOTE — Discharge Instructions (Signed)
Cervical Laser Surgery, Care After This sheet gives you information about how to care for yourself after your procedure. Your health care provider may also give you more specific instructions. If you have problems or questions, contact your health care provider. What can I expect after the procedure? After the procedure, it is common to have:  Pain or discomfort.  Mild cramping.  Bleeding, spotting, or brownish discharge from your vagina.  Follow these instructions at home: Activity  Return to your normal activities as told by your health care provider. Ask your health care provider what activities are safe for you.  Do not lift anything that is heavier than 10 lb (4.5 kg), or the limit that your health care provider tells you, until he or she says that it is safe.  Do not have sex or put anything in your vagina until your health care provider says it is okay. General instructions  Take over-the-counter and prescription medicines only as told by your health care provider.  Do not drive or use heavy machinery while taking prescription pain medicine.  Wear sanitary pads to protect from bleeding, spotting, and discharge.  Do not use tampons or douche until your health care provider says it is okay.  It is up to you to get the results of your procedure. Ask your health care provider, or the department that is doing the procedure, when your results will be ready.  Keep all follow-up visits as told by your health care provider. This is important. Contact a health care provider if:  Your pain or cramping does not improve.  Your periods are more painful than usual.  You do not get your period as expected. Get help right away if:  You have any symptoms of infection, such as: ? A fever. ? Chills. ? Discharge that smells bad.  You have severe pain in your abdomen.  You have heavy bleeding from your vagina (more than a normal period).  You have vaginal bleeding with clumps of  blood (blood clots). Summary  After this procedure, it is common to have pain or discomfort and mild cramping. It is also common to have bleeding, spotting, or brownish discharge from your vagina.  You may need to wear sanitary pads to protect from bleeding, spotting, and discharge.  Do not have sex, use tampons, or douche until your health care provider says it is okay.  Return to your normal activities as told by your health care provider. Ask your health care provider what activities are safe for you.  Take over-the-counter and prescription medicines only as told by your health care provider. These include medicines for pain. This information is not intended to replace advice given to you by your health care provider. Make sure you discuss any questions you have with your health care provider. Document Released: 06/01/2016 Document Revised: 06/01/2016 Document Reviewed: 06/01/2016 Elsevier Interactive Patient Education  2018 [place] Anesthesia, Adult, Care After These instructions provide you with information about caring for yourself after your procedure. Your health care provider may also give you more specific instructions. Your treatment has been planned according to current medical practices, but problems sometimes occur. Call your health care provider if you have any problems or questions after your procedure. What can I expect after the procedure? After the procedure, it is common to have:  Vomiting.  A sore throat.  Mental slowness.  It is common to feel:  Nauseous.  Cold or shivery.  Sleepy.  Tired.  Sore  or achy, even in parts of your body where you did not have surgery.  Follow these instructions at home: For at least 24 hours after the procedure:  Do not: ? Participate in activities where you could fall or become injured. ? Drive. ? Use heavy machinery. ? Drink alcohol. ? Take sleeping pills or medicines that cause drowsiness. ? Make  important decisions or sign legal documents. ? Take care of children on your own.  Rest. Eating and drinking  If you vomit, drink water, juice, or soup when you can drink without vomiting.  Drink enough fluid to keep your urine clear or pale yellow.  Make sure you have little or no nausea before eating solid foods.  Follow the diet recommended by your health care provider. General instructions  Have a responsible adult stay with you until you are awake and alert.  Return to your normal activities as told by your health care provider. Ask your health care provider what activities are safe for you.  Take over-the-counter and prescription medicines only as told by your health care provider.  If you smoke, do not smoke without supervision.  Keep all follow-up visits as told by your health care provider. This is important. Contact a health care provider if:  You continue to have nausea or vomiting at home, and medicines are not helpful.  You cannot drink fluids or start eating again.  You cannot urinate after 8-12 hours.  You develop a skin rash.  You have fever.  You have increasing redness at the site of your procedure. Get help right away if:  You have difficulty breathing.  You have chest pain.  You have unexpected bleeding.  You feel that you are having a life-threatening or urgent problem. This information is not intended to replace advice given to you by your health care provider. Make sure you discuss any questions you have with your health care provider. Document Released: 10/19/2000 Document Revised: 12/16/2015 Document Reviewed: 06/27/2015 Elsevier Interactive Patient Education  [employer].

## 2017-11-17 NOTE — Anesthesia Preprocedure Evaluation (Signed)
Anesthesia Evaluation  Patient identified by MRN, date of birth, ID band Patient awake    Reviewed: Allergy & Precautions, H&P , NPO status , Patient's Chart, lab work & pertinent test results, reviewed documented beta blocker date and time   Airway Mallampati: I  TM Distance: >3 FB Neck ROM: full    Dental no notable dental hx.    Pulmonary neg pulmonary ROS, pneumonia,    Pulmonary exam normal breath sounds clear to auscultation       Cardiovascular Exercise Tolerance: Good negative cardio ROS   Rhythm:regular Rate:Normal     Neuro/Psych  Headaches, Depression negative neurological ROS  negative psych ROS   GI/Hepatic negative GI ROS, Neg liver ROS,   Endo/Other  negative endocrine ROS  Renal/GU Renal diseasenegative Renal ROS  negative genitourinary   Musculoskeletal   Abdominal   Peds  Hematology negative hematology ROS (+) anemia ,   Anesthesia Other Findings   Reproductive/Obstetrics negative OB ROS                             Anesthesia Physical Anesthesia Plan  ASA: II  Anesthesia Plan: General   Post-op Pain Management:    Induction:   PONV Risk Score and Plan: Ondansetron  Airway Management Planned:   Additional Equipment:   Intra-op Plan:   Post-operative Plan:   Informed Consent: I have reviewed the patients History and Physical, chart, labs and discussed the procedure including the risks, benefits and alternatives for the proposed anesthesia with the patient or authorized representative who has indicated his/her understanding and acceptance.   Dental Advisory Given  Plan Discussed with: CRNA  Anesthesia Plan Comments:         Anesthesia Quick Evaluation
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
## 2017-11-17 NOTE — Anesthesia Postprocedure Evaluation (Signed)
Anesthesia Post Note  Patient: Ruth Dunlap  Procedure(s) Performed: Laser Ablation of Cervix (N/A Vagina )  Patient location during evaluation: PACU Anesthesia Type: General Level of consciousness: awake and alert, oriented and patient cooperative Pain management: pain level controlled Vital Signs Assessment: post-procedure vital signs reviewed and stable Respiratory status: spontaneous breathing and respiratory function stable Cardiovascular status: stable Postop Assessment: no apparent nausea or vomiting Anesthetic complications: no     Last Vitals:  Vitals:   11/17/17 1045 11/17/17 1110  BP: 115/74   Pulse: 68   Resp: 16   Temp:    SpO2: 100% 98%    Last Pain:  Vitals:   11/17/17 1110  TempSrc:   PainSc: Asleep                 ADAMS, AMY A

## 2017-11-18 ENCOUNTER — Encounter (HOSPITAL_COMMUNITY): Payer: Self-pay | Admitting: Obstetrics & Gynecology

## 2017-11-25 ENCOUNTER — Encounter: Payer: Self-pay | Admitting: Obstetrics & Gynecology

## 2017-11-25 ENCOUNTER — Other Ambulatory Visit: Payer: Self-pay

## 2017-11-25 ENCOUNTER — Ambulatory Visit: Payer: Medicaid Other | Admitting: Obstetrics & Gynecology

## 2017-11-25 VITALS — BP 114/66 | HR 94 | Ht 66.0 in | Wt 174.0 lb

## 2017-11-25 DIAGNOSIS — Z9889 Other specified postprocedural states: Secondary | ICD-10-CM

## 2017-11-25 NOTE — Progress Notes (Signed)
  HPI: Patient returns for routine postoperative follow-up having undergone laser ablation of the cervix on 11/17/2017.  The patient's immediate postoperative recovery has been unremarkable. Since hospital discharge the patient reports discharge.   Current Outpatient Medications: HYDROcodone-acetaminophen (NORCO/VICODIN) 5-325 MG tablet, Take 1 tablet by mouth every 6 (six) hours as needed., Disp: 10 tablet, Rfl: 0 ketorolac (TORADOL) 10 MG tablet, Take 1 tablet (10 mg total) by mouth every 8 (eight) hours as needed., Disp: 15 tablet, Rfl: 0 ondansetron (ZOFRAN ODT) 8 MG disintegrating tablet, Take 1 tablet (8 mg total) by mouth every 8 (eight) hours as needed for nausea or vomiting., Disp: 20 tablet, Rfl: 0  No current facility-administered medications for this visit.     Blood pressure 114/66, pulse 94, height 5\' 6"  (1.676 m), weight 174 lb (78.9 kg), not currently breastfeeding.  Physical Exam: Normal post ablation cervix  Diagnostic Tests:   Pathology: HSIL on cervical biopsy  Impression: S/P laser ablation of the cervix for HSIL  Plan:   Follow up: 6  Months for follow up Pap  Florian Buff, MD

## 2017-12-10 ENCOUNTER — Encounter (HOSPITAL_COMMUNITY): Payer: Self-pay

## 2017-12-10 ENCOUNTER — Other Ambulatory Visit: Payer: Self-pay

## 2017-12-10 ENCOUNTER — Emergency Department (HOSPITAL_COMMUNITY)
Admission: EM | Admit: 2017-12-10 | Discharge: 2017-12-10 | Disposition: A | Discharge status: Home / Self Care | Payer: Medicaid Other | Attending: Emergency Medicine | Admitting: Emergency Medicine

## 2017-12-10 DIAGNOSIS — J020 Streptococcal pharyngitis: Secondary | ICD-10-CM

## 2017-12-10 DIAGNOSIS — J029 Acute pharyngitis, unspecified: Secondary | ICD-10-CM | POA: Diagnosis present

## 2017-12-10 LAB — GROUP A STREP BY PCR: Group A Strep by PCR: DETECTED — AB

## 2017-12-10 MED ORDER — IBUPROFEN 800 MG PO TABS: 800.0000 mg | ORAL_TABLET | Freq: Three times a day (TID) | ORAL | 0 refills | Status: DC

## 2017-12-10 MED ORDER — ACETAMINOPHEN 500 MG PO TABS
1000.0000 mg | ORAL_TABLET | Freq: Once | ORAL | Status: AC
  Administered 2017-12-10: 1000 mg via ORAL
  Filled 2017-12-10: qty 2

## 2017-12-10 MED ORDER — PENICILLIN G BENZATHINE 1200000 UNIT/2ML IM SUSP
1.2000 10*6.[IU] | Freq: Once | INTRAMUSCULAR | Status: AC
  Administered 2017-12-10: 1.2 10*6.[IU] via INTRAMUSCULAR
  Filled 2017-12-10: qty 2

## 2017-12-10 MED ORDER — DEXAMETHASONE SODIUM PHOSPHATE 4 MG/ML IJ SOLN: 10.0000 mg | Freq: Once | INTRAMUSCULAR | Status: DC

## 2017-12-10 MED ORDER — DEXAMETHASONE SODIUM PHOSPHATE 10 MG/ML IJ SOLN
INTRAMUSCULAR | Status: AC
  Administered 2017-12-10: 10 mg
  Filled 2017-12-10: qty 1

## 2017-12-10 MED ORDER — IBUPROFEN 400 MG PO TABS
400.0000 mg | ORAL_TABLET | Freq: Once | ORAL | Status: AC
  Administered 2017-12-10: 400 mg via ORAL
  Filled 2017-12-10: qty 1
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
## 2017-12-10 NOTE — Discharge Instructions (Addendum)
You were treated for strep throat with an antibiotic shot today.  Keep yourself hydrated at home.  Follow up with your doctor. Alternate Tylenol and ibuprofen as needed every 3 hours for pain and fever.  Return to the ED if you have trouble breathing, trouble swallowing, any other concerns.

## 2017-12-10 NOTE — ED Notes (Signed)
Pt has tolerated drinking approx 4 fl oz well.

## 2017-12-10 NOTE — ED Triage Notes (Signed)
Pt reports sore throat that started yesterday. Reports neck pain and pain with swallowing.

## 2017-12-11 ENCOUNTER — Telehealth: Payer: Self-pay

## 2017-12-11 NOTE — Telephone Encounter (Signed)
Post ED Visit - Positive Culture Follow-up  Culture report reviewed by antimicrobial stewardship pharmacist:  []  Elenor Quinones, Pharm.D. []  Heide Guile, Pharm.D., BCPS AQ-ID []  Parks Neptune, Pharm.D., BCPS []  Alycia Rossetti, Pharm.D., BCPS []  [place], Pharm.D., BCPS, AAHIVP []  Legrand Como, Pharm.D., BCPS, AAHIVP []  Salome Arnt, PharmD, BCPS []  Wynell Balloon, PharmD []  Vincenza Hews, PharmD, BCPS Jimmy Footman Pharm D Positive Strep culture Treated with Bicillin LA, organism sensitive to the same and no further patient follow-up is required at this time.  Genia Del 12/11/2017, 9:50 AM

## 2018-01-06 ENCOUNTER — Encounter: Payer: Self-pay | Admitting: *Deleted

## 2018-01-06 ENCOUNTER — Ambulatory Visit (INDEPENDENT_AMBULATORY_CARE_PROVIDER_SITE_OTHER): Payer: Medicaid Other | Admitting: *Deleted

## 2018-01-06 ENCOUNTER — Other Ambulatory Visit: Payer: Self-pay

## 2018-01-06 DIAGNOSIS — Z3042 Encounter for surveillance of injectable contraceptive: Secondary | ICD-10-CM | POA: Diagnosis not present

## 2018-01-06 DIAGNOSIS — Z3202 Encounter for pregnancy test, result negative: Secondary | ICD-10-CM | POA: Diagnosis not present

## 2018-01-06 LAB — POCT URINE PREGNANCY: Preg Test, Ur: NEGATIVE

## 2018-01-06 MED ORDER — MEDROXYPROGESTERONE ACETATE 150 MG/ML IM SUSP
150.0000 mg | Freq: Once | INTRAMUSCULAR | Status: AC
  Administered 2018-01-06: 150 mg via INTRAMUSCULAR

## 2018-01-06 NOTE — Progress Notes (Signed)
Pt given DepoProvera 150mg IM left deltoid without complications. Advised to return in 12 weeks for next injection. 

## 2018-01-11 ENCOUNTER — Ambulatory Visit: Payer: Medicaid Other

## 2018-03-31 ENCOUNTER — Ambulatory Visit: Payer: Medicaid Other

## 2018-06-29 ENCOUNTER — Ambulatory Visit (INDEPENDENT_AMBULATORY_CARE_PROVIDER_SITE_OTHER): Payer: Medicaid Other | Admitting: *Deleted

## 2018-06-29 DIAGNOSIS — Z3202 Encounter for pregnancy test, result negative: Secondary | ICD-10-CM | POA: Diagnosis not present

## 2018-06-29 DIAGNOSIS — Z3042 Encounter for surveillance of injectable contraceptive: Secondary | ICD-10-CM

## 2018-06-29 LAB — POCT URINE PREGNANCY: Preg Test, Ur: NEGATIVE

## 2018-06-29 MED ORDER — MEDROXYPROGESTERONE ACETATE 150 MG/ML IM SUSP
150.0000 mg | Freq: Once | INTRAMUSCULAR | Status: AC
  Administered 2018-06-29: 150 mg via INTRAMUSCULAR

## 2018-06-29 NOTE — Progress Notes (Signed)
Last depo was given in June. Patient last sexual intercourse was 3 weeks ago. Pregnancy test is negative. Ok to give depo per [employer]. Pt given Depo Provera 150 mg IM in left deltoid. Patient tolerated well. Instructed to return in 12 weeks for  Next dose.

## 2018-08-22 ENCOUNTER — Other Ambulatory Visit: Payer: Self-pay

## 2018-08-22 ENCOUNTER — Encounter (HOSPITAL_COMMUNITY): Payer: Self-pay | Admitting: Emergency Medicine

## 2018-08-22 DIAGNOSIS — R51 Headache: Secondary | ICD-10-CM | POA: Insufficient documentation

## 2018-08-22 NOTE — ED Triage Notes (Signed)
Pt reports left ear, jaw and neck pain since yesterday

## 2018-08-23 ENCOUNTER — Emergency Department (HOSPITAL_COMMUNITY)
Admission: EM | Admit: 2018-08-23 | Discharge: 2018-08-23 | Disposition: A | Discharge status: Home / Self Care | Payer: Medicaid Other | Attending: Emergency Medicine | Admitting: Emergency Medicine

## 2018-08-23 DIAGNOSIS — R519 Headache, unspecified: Secondary | ICD-10-CM

## 2018-08-23 DIAGNOSIS — R51 Headache: Secondary | ICD-10-CM

## 2018-08-23 MED ORDER — AMOXICILLIN 500 MG PO CAPS: 500.0000 mg | ORAL_CAPSULE | Freq: Three times a day (TID) | ORAL | 0 refills | Status: DC

## 2018-08-23 MED ORDER — ACETAMINOPHEN 325 MG PO TABS
650.0000 mg | ORAL_TABLET | Freq: Once | ORAL | Status: AC
  Administered 2018-08-23: 650 mg via ORAL
  Filled 2018-08-23: qty 2

## 2018-08-23 MED ORDER — AMOXICILLIN 250 MG PO CAPS
500.0000 mg | ORAL_CAPSULE | Freq: Once | ORAL | Status: AC
  Administered 2018-08-23: 500 mg via ORAL
  Filled 2018-08-23: qty 2

## 2018-08-23 NOTE — Discharge Instructions (Addendum)
It is not clear from your exam whether you are having a infection around your wisdom tooth or a infection in your sinus and your face.  Take the antibiotic that was prescribed until gone, it will treat either infection.  Take acetaminophen 650 mg with your ibuprofen for pain.  Consider seeing a dentist about your wisdom tooth.  Return to the ED if you get fever, worsening pain, or have difficulty swallowing or breathing.
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
## 2018-09-06 ENCOUNTER — Other Ambulatory Visit: Payer: Self-pay | Admitting: Adult Health

## 2018-09-15 ENCOUNTER — Encounter: Payer: Self-pay | Admitting: *Deleted

## 2018-09-15 ENCOUNTER — Other Ambulatory Visit: Payer: Self-pay | Admitting: Family Medicine

## 2018-09-19 ENCOUNTER — Other Ambulatory Visit: Payer: Self-pay | Admitting: Obstetrics & Gynecology

## 2018-09-21 ENCOUNTER — Ambulatory Visit: Payer: Medicaid Other

## 2018-09-22 ENCOUNTER — Ambulatory Visit (INDEPENDENT_AMBULATORY_CARE_PROVIDER_SITE_OTHER): Payer: Self-pay

## 2018-09-22 VITALS — Ht 66.0 in | Wt 171.4 lb

## 2018-09-22 DIAGNOSIS — Z3042 Encounter for surveillance of injectable contraceptive: Secondary | ICD-10-CM

## 2018-09-22 DIAGNOSIS — Z3202 Encounter for pregnancy test, result negative: Secondary | ICD-10-CM

## 2018-09-22 LAB — POCT URINE PREGNANCY: PREG TEST UR: NEGATIVE

## 2018-09-22 MED ORDER — MEDROXYPROGESTERONE ACETATE 150 MG/ML IM SUSP
150.0000 mg | Freq: Once | INTRAMUSCULAR | Status: AC
  Administered 2018-09-22: 150 mg via INTRAMUSCULAR

## 2018-09-22 NOTE — Progress Notes (Signed)
Pt here for depo in. Pad CMAjection 150 mg IM given rt deltoid. Tolerated well. Return 12 weeks for next injection

## 2018-12-15 ENCOUNTER — Ambulatory Visit: Payer: Self-pay

## 2019-01-21 ENCOUNTER — Other Ambulatory Visit: Payer: Self-pay

## 2019-01-21 ENCOUNTER — Encounter (HOSPITAL_COMMUNITY): Payer: Self-pay

## 2019-01-21 ENCOUNTER — Emergency Department (HOSPITAL_COMMUNITY)
Admission: EM | Admit: 2019-01-21 | Discharge: 2019-01-21 | Disposition: A | Discharge status: Home / Self Care | Payer: Medicaid Other | Attending: Emergency Medicine | Admitting: Emergency Medicine

## 2019-01-21 DIAGNOSIS — F410 Panic disorder [episodic paroxysmal anxiety] without agoraphobia: Secondary | ICD-10-CM | POA: Diagnosis not present

## 2019-01-21 DIAGNOSIS — Z79899 Other long term (current) drug therapy: Secondary | ICD-10-CM | POA: Insufficient documentation

## 2019-01-21 MED ORDER — LORAZEPAM 1 MG PO TABS: 1.0000 mg | ORAL_TABLET | Freq: Three times a day (TID) | ORAL | 0 refills | Status: DC | PRN

## 2019-01-21 MED ORDER — LORAZEPAM 1 MG PO TABS
1.0000 mg | ORAL_TABLET | Freq: Once | ORAL | Status: AC
  Administered 2019-01-21: 06:00:00 1 mg via ORAL
  Filled 2019-01-21: qty 1

## 2019-01-21 NOTE — ED Triage Notes (Signed)
Pt arrives from home via POV c/o Panic attack with hot flashes and trembling nerves. Pt reports she was sitting on front porch with her sister when panic attack occurred.
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
## 2019-03-29 ENCOUNTER — Ambulatory Visit (INDEPENDENT_AMBULATORY_CARE_PROVIDER_SITE_OTHER): Payer: Self-pay | Admitting: Women's Health

## 2019-03-29 ENCOUNTER — Encounter: Payer: Self-pay | Admitting: Women's Health

## 2019-03-29 ENCOUNTER — Other Ambulatory Visit (HOSPITAL_COMMUNITY)
Admission: RE | Admit: 2019-03-29 | Discharge: 2019-03-29 | Disposition: A | Discharge status: Home / Self Care | Payer: Medicaid Other | Source: Ambulatory Visit | Attending: Adult Health | Admitting: Adult Health

## 2019-03-29 ENCOUNTER — Other Ambulatory Visit: Payer: Self-pay

## 2019-03-29 VITALS — BP 124/74 | HR 87 | Ht 66.0 in | Wt 194.5 lb

## 2019-03-29 DIAGNOSIS — Z01419 Encounter for gynecological examination (general) (routine) without abnormal findings: Secondary | ICD-10-CM

## 2019-03-29 NOTE — Progress Notes (Signed)
   WELL-WOMAN EXAMINATION Patient name: Ruth Dunlap MRN [id number]  Date of birth: [birth year] Chief Complaint:   Gynecologic Exam  History of Present Illness:   Ruth Dunlap is a 23 y.o. [phone] African American female being seen today for a routine well-woman exam.  Current complaints: none  PCP: none      does not desire labs Patient's last menstrual period was 03/15/2019 (approximate). The current method of family planning is condoms.  Last pap 10/04/17. 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 Results were: n/a. Family h/o breast cancer: No Last colonoscopy: never. Results were: n/a. Family h/o colorectal cancer: Yes Review of Systems:   Pertinent items are noted in HPI Denies any headaches, blurred vision, fatigue, shortness of breath, chest pain, abdominal pain, abnormal vaginal discharge/itching/odor/irritation, problems with periods, bowel movements, urination, or intercourse unless otherwise stated above. Pertinent History Reviewed:  Reviewed past medical,surgical, social and family history.  Reviewed problem list, medications and allergies. Physical Assessment:   Vitals:   03/29/19 1401  BP: 124/74  Pulse: 87  Weight: 194 lb 8 oz (88.2 kg)  Height: 5\' 6"  (1.676 m)  Body mass index is 31.39 kg/m.        Physical Examination by Guido Sander, SNP   General appearance - well appearing, and in no distress  Mental status - alert, oriented to person, place, and time  Psych:  She has a normal mood and affect  Skin - warm and dry, normal color, no suspicious lesions noted  Chest - effort normal, all lung fields clear to auscultation bilaterally  Heart - normal rate and regular rhythm  Neck:  midline trachea, no thyromegaly or nodules  Breasts - breasts appear normal, no suspicious masses, no skin or nipple changes or  axillary nodes  Abdomen - soft, nontender, nondistended, no masses or organomegaly  Pelvic - VULVA:  normal appearing vulva with no masses, tenderness or lesions  VAGINA: normal appearing vagina with normal color and discharge, no lesions  CERVIX: normal appearing cervix without discharge or lesions, no CMT  Thin prep pap is done w/ HR HPV cotesting  UTERUS: uterus is felt to be normal size, shape, consistency and nontender   ADNEXA: No adnexal masses or tenderness noted.  Extremities:  No swelling or varicosities noted  No results found for this or any previous visit (from the past 24 hour(s)).  Assessment & Plan:  1) Well-Woman Exam  2) H/O abnormal pap w/ laser ablation  Labs/procedures today: pap  Mammogram @25yo  or sooner if problems Colonoscopy @25yo  or sooner if problems  No orders of the defined types were placed in this encounter.   Meds: No orders of the defined types were placed in this encounter.   Follow-up: Return in about 1 year (around 03/28/2020) for Physical.  Roma Schanz CNM, WHNP-BC 03/29/2019 4:12 PM

## 2019-03-31 LAB — CYTOLOGY - PAP
Adequacy: ABSENT
Chlamydia: POSITIVE — AB
Diagnosis: NEGATIVE
Neisseria Gonorrhea: NEGATIVE

## 2019-04-04 ENCOUNTER — Other Ambulatory Visit: Payer: Self-pay | Admitting: Women's Health

## 2019-04-04 DIAGNOSIS — A749 Chlamydial infection, unspecified: Secondary | ICD-10-CM

## 2019-04-04 MED ORDER — AZITHROMYCIN 500 MG PO TABS: 1000.0000 mg | ORAL_TABLET | Freq: Once | ORAL | 0 refills | Status: AC

## 2019-04-04 MED ORDER — FLUCONAZOLE 150 MG PO TABS: 150.0000 mg | ORAL_TABLET | Freq: Once | ORAL | 0 refills | Status: AC

## 2019-04-05 ENCOUNTER — Other Ambulatory Visit: Payer: Self-pay | Admitting: Adult Health

## 2019-04-27 ENCOUNTER — Other Ambulatory Visit: Payer: Self-pay | Admitting: *Deleted

## 2019-04-27 DIAGNOSIS — Z20822 Contact with and (suspected) exposure to covid-19: Secondary | ICD-10-CM

## 2019-04-28 LAB — NOVEL CORONAVIRUS, NAA: SARS-CoV-2, NAA: NOT DETECTED

## 2019-05-11 ENCOUNTER — Other Ambulatory Visit: Payer: Self-pay | Admitting: *Deleted

## 2019-05-11 DIAGNOSIS — D492 Neoplasm of unspecified behavior of bone, soft tissue, and skin: Secondary | ICD-10-CM

## 2019-05-13 ENCOUNTER — Encounter (HOSPITAL_COMMUNITY): Payer: Self-pay | Admitting: Emergency Medicine

## 2019-05-13 ENCOUNTER — Other Ambulatory Visit: Payer: Self-pay

## 2019-05-13 ENCOUNTER — Emergency Department (HOSPITAL_COMMUNITY)
Admission: EM | Admit: 2019-05-13 | Discharge: 2019-05-13 | Disposition: A | Discharge status: Home / Self Care | Payer: Medicaid Other | Attending: Emergency Medicine | Admitting: Emergency Medicine

## 2019-05-13 DIAGNOSIS — K921 Melena: Secondary | ICD-10-CM | POA: Diagnosis not present

## 2019-05-13 DIAGNOSIS — R0602 Shortness of breath: Secondary | ICD-10-CM | POA: Diagnosis not present

## 2019-05-13 DIAGNOSIS — R109 Unspecified abdominal pain: Secondary | ICD-10-CM

## 2019-05-13 DIAGNOSIS — R197 Diarrhea, unspecified: Secondary | ICD-10-CM | POA: Insufficient documentation

## 2019-05-13 DIAGNOSIS — K6289 Other specified diseases of anus and rectum: Secondary | ICD-10-CM | POA: Diagnosis not present

## 2019-05-13 DIAGNOSIS — R1030 Lower abdominal pain, unspecified: Secondary | ICD-10-CM | POA: Insufficient documentation

## 2019-05-13 DIAGNOSIS — R12 Heartburn: Secondary | ICD-10-CM | POA: Insufficient documentation

## 2019-05-13 LAB — CBC WITH DIFFERENTIAL/PLATELET
Abs Immature Granulocytes: 0.02 10*3/uL (ref 0.00–0.07)
Basophils Absolute: 0 10*3/uL (ref 0.0–0.1)
Basophils Relative: 1 %
Eosinophils Absolute: 0.1 10*3/uL (ref 0.0–0.5)
Eosinophils Relative: 1 %
HCT: 39 % (ref 36.0–46.0)
Hemoglobin: 12.5 g/dL (ref 12.0–15.0)
Immature Granulocytes: 0 %
Lymphocytes Relative: 31 %
Lymphs Abs: 1.7 10*3/uL (ref 0.7–4.0)
MCH: 27.4 pg (ref 26.0–34.0)
MCHC: 32.1 g/dL (ref 30.0–36.0)
MCV: 85.3 fL (ref 80.0–100.0)
Monocytes Absolute: 0.5 10*3/uL (ref 0.1–1.0)
Monocytes Relative: 9 %
Neutro Abs: 3.2 10*3/uL (ref 1.7–7.7)
Neutrophils Relative %: 58 %
Platelets: 276 10*3/uL (ref 150–400)
RBC: 4.57 MIL/uL (ref 3.87–5.11)
RDW: 13.2 % (ref 11.5–15.5)
WBC: 5.4 10*3/uL (ref 4.0–10.5)
nRBC: 0 % (ref 0.0–0.2)

## 2019-05-13 LAB — URINALYSIS, ROUTINE W REFLEX MICROSCOPIC
Bilirubin Urine: NEGATIVE
Glucose, UA: NEGATIVE mg/dL
Hgb urine dipstick: NEGATIVE
Ketones, ur: NEGATIVE mg/dL
Leukocytes,Ua: NEGATIVE
Nitrite: NEGATIVE
Protein, ur: NEGATIVE mg/dL
Specific Gravity, Urine: 1.008 (ref 1.005–1.030)
pH: 8 (ref 5.0–8.0)

## 2019-05-13 LAB — COMPREHENSIVE METABOLIC PANEL
ALT: 29 U/L (ref 0–44)
AST: 24 U/L (ref 15–41)
Albumin: 3.9 g/dL (ref 3.5–5.0)
Alkaline Phosphatase: 51 U/L (ref 38–126)
Anion gap: 6 (ref 5–15)
BUN: 10 mg/dL (ref 6–20)
CO2: 24 mmol/L (ref 22–32)
Calcium: 9.2 mg/dL (ref 8.9–10.3)
Chloride: 109 mmol/L (ref 98–111)
Creatinine, Ser: 0.71 mg/dL (ref 0.44–1.00)
GFR calc Af Amer: >60 mL/min (ref >60)
GFR calc non Af Amer: >60 mL/min (ref >60)
Glucose, Bld: 116 mg/dL — ABNORMAL HIGH (ref 70–99)
Potassium: 4.1 mmol/L (ref 3.5–5.1)
Sodium: 139 mmol/L (ref 135–145)
Total Bilirubin: 0.4 mg/dL (ref 0.3–1.2)
Total Protein: 7.1 g/dL (ref 6.5–8.1)

## 2019-05-13 LAB — PREGNANCY, URINE: Preg Test, Ur: NEGATIVE

## 2019-05-13 LAB — POC OCCULT BLOOD, ED: Fecal Occult Bld: NEGATIVE

## 2019-05-13 MED ORDER — PANTOPRAZOLE SODIUM 40 MG IV SOLR
40.0000 mg | Freq: Once | INTRAVENOUS | Status: AC
  Administered 2019-05-13: 16:00:00 40 mg via INTRAVENOUS
  Filled 2019-05-13: qty 40

## 2019-05-13 MED ORDER — PANTOPRAZOLE SODIUM 20 MG PO TBEC: 20.0000 mg | DELAYED_RELEASE_TABLET | Freq: Every day | ORAL | 0 refills | Status: DC

## 2019-05-13 NOTE — Discharge Instructions (Addendum)
Take the prescribed medication as directed once a day.  You may also try a BRAT diet which may help control your diarrhea.  This consists of bananas, rice, applesauce and toast.  You may also want to consider taking a over-the-counter probiotic as directed.  Call the GI doctor listed to arrange a follow-up appointment.  Return to the emergency room if you develop any worsening symptoms such as fever, worsening of abdominal pain or vomiting

## 2019-05-13 NOTE — ED Provider Notes (Signed)
   Patient is a 23 yo female signed out to me by Evalee Jefferson, PA-C evaluated for diarrhea and a single episode of bright red blood per rectum earlier today.  U/A, preg results pending.  On recheck, patient reports mild continued abdominal pain but overall improved.  Laboratory studies are reassuring.  Heme neg stool per previous provider.  No concerning symptoms for acute abdomen.  She states she is ready for discharge home.  She agrees to treatment plan with prescription for PPI and GI follow-up.  Return precautions discussed.      Labs Reviewed  COMPREHENSIVE METABOLIC PANEL - Abnormal; Notable for the following components:      Result Value   Glucose, Bld 116 (*)    All other components within normal limits  URINALYSIS, ROUTINE W REFLEX MICROSCOPIC - Abnormal; Notable for the following components:   Color, Urine STRAW (*)    All other components within normal limits  CBC WITH DIFFERENTIAL/PLATELET  PREGNANCY, URINE  POC OCCULT BLOOD, ED  POC OCCULT BLOOD, ED      Kem Parkinson, PA-C 05/13/19 2129    Fredia Sorrow, MD 05/23/19 [phone]
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
## 2019-05-13 NOTE — ED Triage Notes (Signed)
Patient states she had a bowel movement 1 hour ago and saw "red blood" in the toilet and on toilet paper today. Patient states she then started having a panic attack and states shortness of breath.

## 2019-05-17 ENCOUNTER — Encounter: Payer: Self-pay | Admitting: Internal Medicine

## 2019-05-18 ENCOUNTER — Encounter (HOSPITAL_COMMUNITY): Payer: Self-pay

## 2019-05-18 ENCOUNTER — Ambulatory Visit (HOSPITAL_COMMUNITY): Payer: Medicaid Other

## 2019-05-24 ENCOUNTER — Other Ambulatory Visit (INDEPENDENT_AMBULATORY_CARE_PROVIDER_SITE_OTHER): Payer: Medicaid Other | Admitting: *Deleted

## 2019-05-24 ENCOUNTER — Encounter: Payer: Self-pay | Admitting: *Deleted

## 2019-05-24 ENCOUNTER — Other Ambulatory Visit (HOSPITAL_COMMUNITY)
Admission: RE | Admit: 2019-05-24 | Discharge: 2019-05-24 | Disposition: A | Discharge status: Home / Self Care | Payer: Medicaid Other | Source: Ambulatory Visit | Attending: Obstetrics & Gynecology | Admitting: Obstetrics & Gynecology

## 2019-05-24 ENCOUNTER — Other Ambulatory Visit: Payer: Self-pay

## 2019-05-24 DIAGNOSIS — A749 Chlamydial infection, unspecified: Secondary | ICD-10-CM | POA: Diagnosis present

## 2019-05-24 NOTE — Progress Notes (Addendum)
   NURSE VISIT- POC  SUBJECTIVE:  Ruth Dunlap is a 23 y.o. [phone] GYN patientfemale here for a vaginal swab for proof of cure after treatment for Chlamydia. Denies abnormal vaginal bleeding, significant pelvic pain, fever, or UTI symptoms.  OBJECTIVE:  LMP 05/05/2019   Appears well, in no apparent distress  ASSESSMENT: Vaginal swab for proof of cure after treatment for chlamydia  PLAN: Self-collected vaginal probe for Gonorrhea, Chlamydia, Trichomonas sent to lab Treatment: to be determined once results are received Follow-up as needed if symptoms persist/worsen, or new symptoms develop  Ruth Dunlap  05/24/2019 11:40 AM   Chart reviewed for nurse visit. Agree with plan of care.  Ruth Dunlap, [place] 05/24/2019 1:06 PM

## 2019-05-26 LAB — CERVICOVAGINAL ANCILLARY ONLY
Chlamydia: NEGATIVE
Comment: NEGATIVE
Comment: NEGATIVE
Comment: NORMAL
Neisseria Gonorrhea: NEGATIVE
Trichomonas: NEGATIVE

## 2019-05-28 ENCOUNTER — Encounter (HOSPITAL_COMMUNITY): Payer: Self-pay | Admitting: Emergency Medicine

## 2019-05-28 ENCOUNTER — Other Ambulatory Visit: Payer: Self-pay

## 2019-05-28 ENCOUNTER — Emergency Department (HOSPITAL_COMMUNITY)
Admission: EM | Admit: 2019-05-28 | Discharge: 2019-05-28 | Disposition: A | Discharge status: Home / Self Care | Payer: Medicaid Other | Attending: Emergency Medicine | Admitting: Emergency Medicine

## 2019-05-28 DIAGNOSIS — Z20828 Contact with and (suspected) exposure to other viral communicable diseases: Secondary | ICD-10-CM | POA: Diagnosis not present

## 2019-05-28 DIAGNOSIS — J069 Acute upper respiratory infection, unspecified: Secondary | ICD-10-CM | POA: Diagnosis not present

## 2019-05-28 DIAGNOSIS — Z79899 Other long term (current) drug therapy: Secondary | ICD-10-CM | POA: Insufficient documentation

## 2019-05-28 DIAGNOSIS — J029 Acute pharyngitis, unspecified: Secondary | ICD-10-CM

## 2019-05-28 LAB — GROUP A STREP BY PCR: Group A Strep by PCR: NOT DETECTED

## 2019-05-28 MED ORDER — DEXAMETHASONE SODIUM PHOSPHATE 10 MG/ML IJ SOLN
10.0000 mg | Freq: Once | INTRAMUSCULAR | Status: AC
  Administered 2019-05-28: 10 mg via INTRAMUSCULAR
  Filled 2019-05-28: qty 1

## 2019-05-28 MED ORDER — BENZONATATE 100 MG PO CAPS: 100.0000 mg | ORAL_CAPSULE | Freq: Three times a day (TID) | ORAL | 0 refills | Status: DC | PRN

## 2019-05-28 MED ORDER — PHENOL 1.4 % MT LIQD: 1.0000 | OROMUCOSAL | 0 refills | Status: DC | PRN

## 2019-05-28 MED ORDER — IPRATROPIUM BROMIDE 0.03 % NA SOLN: 2.0000 | Freq: Three times a day (TID) | NASAL | 0 refills | Status: DC | PRN

## 2019-05-28 NOTE — Discharge Instructions (Addendum)
Your strep test today was negative.  We will test you for Covid which could potentially be causing your symptoms.  You will see for phone call if your test is positive, no phone call if your test is negative.  Your test will result within about 48 hours.  You can also see your results on MyChart.  If your test is positive you will need to be quarantined at home for at least 14 days from symptom onset.  In the meantime, take 1 to 2 tablets of Tylenol every 6 hours as needed for pain or fever.  Use Atrovent nasal spray for congestion, Tessalon for cough, phenol spray for sore throat.  You can also use warm water salt gargles, warm teas, honey, and cough drops/throat lozenges for your symptoms.  Follow-up with PCP if symptoms persist.  Return to the emergency department if any concerning signs or symptoms develop such as severe shortness of breath, difficulty swallowing, drooling, voice change, chest pain, or loss of consciousness.

## 2019-05-28 NOTE — ED Triage Notes (Signed)
Pt reports nasal congestion, sore throat since last night. nad noted.
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
## 2019-05-29 ENCOUNTER — Ambulatory Visit (HOSPITAL_COMMUNITY)
Admission: RE | Admit: 2019-05-29 | Discharge: 2019-05-29 | Disposition: A | Discharge status: Home / Self Care | Payer: Medicaid Other | Source: Ambulatory Visit | Attending: *Deleted | Admitting: *Deleted

## 2019-05-29 DIAGNOSIS — D492 Neoplasm of unspecified behavior of bone, soft tissue, and skin: Secondary | ICD-10-CM | POA: Diagnosis present

## 2019-05-29 LAB — NOVEL CORONAVIRUS, NAA (HOSP ORDER, SEND-OUT TO REF LAB; TAT 18-24 HRS): SARS-CoV-2, NAA: NOT DETECTED
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
## 2019-05-31 ENCOUNTER — Ambulatory Visit (INDEPENDENT_AMBULATORY_CARE_PROVIDER_SITE_OTHER): Payer: Medicaid Other | Admitting: Gastroenterology

## 2019-05-31 ENCOUNTER — Other Ambulatory Visit: Payer: Self-pay | Admitting: *Deleted

## 2019-05-31 ENCOUNTER — Encounter: Payer: Self-pay | Admitting: *Deleted

## 2019-05-31 ENCOUNTER — Encounter: Payer: Self-pay | Admitting: Gastroenterology

## 2019-05-31 ENCOUNTER — Other Ambulatory Visit: Payer: Self-pay

## 2019-05-31 ENCOUNTER — Other Ambulatory Visit (HOSPITAL_COMMUNITY): Payer: Self-pay | Admitting: *Deleted

## 2019-05-31 VITALS — BP 131/69 | HR 90 | Temp 96.2°F | Ht 66.0 in | Wt 195.4 lb

## 2019-05-31 DIAGNOSIS — R1013 Epigastric pain: Secondary | ICD-10-CM

## 2019-05-31 DIAGNOSIS — R6881 Early satiety: Secondary | ICD-10-CM | POA: Diagnosis not present

## 2019-05-31 DIAGNOSIS — R1310 Dysphagia, unspecified: Secondary | ICD-10-CM

## 2019-05-31 DIAGNOSIS — K219 Gastro-esophageal reflux disease without esophagitis: Secondary | ICD-10-CM

## 2019-05-31 DIAGNOSIS — K625 Hemorrhage of anus and rectum: Secondary | ICD-10-CM

## 2019-05-31 DIAGNOSIS — D492 Neoplasm of unspecified behavior of bone, soft tissue, and skin: Secondary | ICD-10-CM

## 2019-05-31 MED ORDER — CLENPIQ 10-3.5-12 MG-GM -GM/160ML PO SOLN: 1.0000 | Freq: Once | ORAL | 0 refills | Status: AC

## 2019-05-31 MED ORDER — PANTOPRAZOLE SODIUM 40 MG PO TBEC: 40.0000 mg | DELAYED_RELEASE_TABLET | Freq: Every day | ORAL | 5 refills | Status: DC

## 2019-05-31 NOTE — Patient Instructions (Addendum)
We will get you scheduled for an upper endoscopy with possible dilation of your esophagus and colonoscopy in the near future with Dr. Gala Romney.  Please start Protonix 40 mg daily 30 minutes before breakfast.  Please follow a GERD diet.  Avoid fried, fatty, greasy, spicy, and citrus foods.  Avoid soda, carbonated beverages, and chocolate.  See handout below.  You should eat 4-6 small meals daily.  Avoid eating within 3 hours of laying down.  Avoid all NSAIDs.  This includes ibuprofen, Aleve, Advil, and Goody powders.  Avoid foods that cause bloating.  See handout below.  We will see you back after your procedures.  Call if you have questions or concerns prior to your next visit.  Ruth Altes, PA-C [facility]     Food Choices for Gastroesophageal Reflux Disease, Adult When you have gastroesophageal reflux disease (GERD), the foods you eat and your eating habits are very important. Choosing the right foods can help ease your discomfort. Think about working with a nutrition specialist (dietitian) to help you make good choices. What are tips for following this plan?  Meals  Choose healthy foods that are low in fat, such as fruits, vegetables, whole grains, low-fat dairy products, and lean meat, fish, and poultry.  Eat small meals often instead of 3 large meals a day. Eat your meals slowly, and in a place where you are relaxed. Avoid bending over or lying down until 2-3 hours after eating.  Avoid eating meals 2-3 hours before bed.  Avoid drinking a lot of liquid with meals.  Cook foods using methods other than frying. Bake, grill, or broil food instead.  Avoid or limit: ? Chocolate. ? Peppermint or spearmint. ? Alcohol. ? Pepper. ? Black and decaffeinated coffee. ? Black and decaffeinated tea. ? Bubbly (carbonated) soft drinks. ? Caffeinated energy drinks and soft drinks.  Limit high-fat foods such as: ? Fatty meat or fried foods. ? Whole milk, cream,  butter, or ice cream. ? Nuts and nut butters. ? Pastries, donuts, and sweets made with butter or shortening.  Avoid foods that cause symptoms. These foods may be different for everyone. Common foods that cause symptoms include: ? Tomatoes. ? Oranges, lemons, and limes. ? Peppers. ? Spicy food. ? Onions and garlic. ? Vinegar. Lifestyle  Maintain a healthy weight. Ask your doctor what weight is healthy for you. If you need to lose weight, work with your doctor to do so safely.  Exercise for at least 30 minutes for 5 or more days each week, or as told by your doctor.  Wear loose-fitting clothes.  Do not smoke. If you need help quitting, ask your doctor.  Sleep with the head of your bed higher than your feet. Use a wedge under the mattress or blocks under the bed frame to raise the head of the bed. Summary  When you have gastroesophageal reflux disease (GERD), food and lifestyle choices are very important in easing your symptoms.  Eat small meals often instead of 3 large meals a day. Eat your meals slowly, and in a place where you are relaxed.  Limit high-fat foods such as fatty meat or fried foods.  Avoid bending over or lying down until 2-3 hours after eating.  Avoid peppermint and spearmint, caffeine, alcohol, and chocolate. This information is not intended to replace advice given to you by your health care provider. Make sure you discuss any questions you have with your health care provider. Document Released: 01/12/2012 Document Revised: 11/03/2018 Document Reviewed: 08/18/2016 Elsevier  Patient Education  [employer].   Abdominal Bloating When you have abdominal bloating, your abdomen may feel full, tight, or painful. It may also look bigger than normal or swollen (distended). Common causes of abdominal bloating include:  Swallowing air.  Constipation.  Problems digesting food.  Eating too much.  Irritable bowel syndrome. This is a condition that affects the  large intestine.  Lactose intolerance. This is an inability to digest lactose, a natural sugar in dairy products.  Celiac disease. This is a condition that affects the ability to digest gluten, a protein found in some grains.  Gastroparesis. This is a condition that slows down the movement of food in the stomach and small intestine. It is more common in people with diabetes mellitus.  Gastroesophageal reflux disease (GERD). This is a digestive condition that makes stomach acid flow back into the esophagus.  Urinary retention. This means that the body is holding onto urine, and the bladder cannot be emptied all the way. Follow these instructions at home: Eating and drinking  Avoid eating too much.  Try not to swallow air while talking or eating.  Avoid eating while lying down.  Avoid these foods and drinks: ? Foods that cause gas, such as broccoli, cabbage, cauliflower, and baked beans. ? Carbonated drinks. ? Hard candy. ? Chewing gum. Medicines  Take over-the-counter and prescription medicines only as told by your health care provider.  Take probiotic medicines. These medicines contain live bacteria or yeasts that can help digestion.  Take coated peppermint oil capsules. Activity  Try to exercise regularly. Exercise may help to relieve bloating that is caused by gas and relieve constipation. General instructions  Keep all follow-up visits as told by your health care provider. This is important. Contact a health care provider if:  You have nausea and vomiting.  You have diarrhea.  You have abdominal pain.  You have unusual weight loss or weight gain.  You have severe pain, and medicines do not help. Get help right away if:  You have severe chest pain.  You have trouble breathing.  You have shortness of breath.  You have trouble urinating.  You have darker urine than normal.  You have blood in your stools or have dark, tarry stools. Summary  Abdominal  bloating means that the abdomen is swollen.  Common causes of abdominal bloating are swallowing air, constipation, and problems digesting food.  Avoid eating too much and avoid swallowing air.  Avoid foods that cause gas, carbonated drinks, hard candy, and chewing gum. This information is not intended to replace advice given to you by your health care provider. Make sure you discuss any questions you have with your health care provider. Document Released: 08/14/2016 Document Revised: 10/31/2018 Document Reviewed: 08/14/2016 Elsevier Patient Education  2020 [employer].

## 2019-06-02 ENCOUNTER — Telehealth: Payer: Self-pay | Admitting: *Deleted

## 2019-06-02 ENCOUNTER — Encounter: Payer: Self-pay | Admitting: *Deleted
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
## 2019-06-02 NOTE — Assessment & Plan Note (Signed)
Dysphagia with sensation of food getting hung around the sternal notch as well as a globus sensation x1-2 months.  Occurs daily.  Patient unable to tell me if dysphagia is to solid foods versus soft foods but no trouble with liquids.  This is in the setting of uncontrolled GERD as described below.  Suspect patient likely has esophageal web, ring, or stricture secondary to uncontrolled GERD.  Less likely malignancy.  Proceed with EGD with possible dilation with propofol with Dr. Gala Romney in the near future. The risks, benefits, and alternatives have been discussed in detail with patient. They have stated understanding and desire to proceed.  Start Protonix 40 mg daily 30 minutes before breakfast. Follow-up after procedure.
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
## 2019-06-02 NOTE — Assessment & Plan Note (Signed)
1 episode of bright red blood per rectum in the setting of diarrhea in October 2020.  Blood was mixed in the stool and on the toilet tissue.  Her diarrhea has since resolved and bowel movements are back to baseline with stools daily, soft, and formed.  No known hemorrhoids.  No rectal burning or rectal pain.  She does have occasional cramping in the lower abdomen.  Denies melena or unintentional weight loss.  No family history of colon cancer.  She has been taking ibuprofen 800 mg a few times a week x1 month.  No NSAIDs in a couple months.  Rectal bleeding could be from benign source including hemorrhoids or mucosal irritation in the setting of diarrhea; however, I cannot rule out other etiologies including colon polyps or malignancy.  Proceed with colonoscopy with propofol with Dr. Gala Romney in the near future for further evaluation of rectal bleeding. The risks, benefits, and alternatives have been discussed in detail with patient. They have stated understanding and desire to proceed.  Follow-up after procedure.

## 2019-06-02 NOTE — Progress Notes (Signed)
cc'ed to pcp °

## 2019-06-02 NOTE — Telephone Encounter (Signed)
Called pt as she is on our cancellation list. She has moved procedure up to 12/7 at 10:00am. Called endo and LMOVM to make aware. New instructions mailed to patient.

## 2019-06-06 ENCOUNTER — Encounter (HOSPITAL_COMMUNITY): Payer: Self-pay | Admitting: *Deleted

## 2019-06-06 ENCOUNTER — Other Ambulatory Visit: Payer: Self-pay

## 2019-06-06 ENCOUNTER — Emergency Department (HOSPITAL_COMMUNITY)
Admission: EM | Admit: 2019-06-06 | Discharge: 2019-06-06 | Disposition: A | Discharge status: Home / Self Care | Payer: Medicaid Other | Attending: Emergency Medicine | Admitting: Emergency Medicine

## 2019-06-06 ENCOUNTER — Telehealth (HOSPITAL_COMMUNITY): Payer: Self-pay | Admitting: *Deleted

## 2019-06-06 ENCOUNTER — Emergency Department (HOSPITAL_COMMUNITY): Payer: Medicaid Other

## 2019-06-06 DIAGNOSIS — O99891 Other specified diseases and conditions complicating pregnancy: Secondary | ICD-10-CM | POA: Insufficient documentation

## 2019-06-06 DIAGNOSIS — Z113 Encounter for screening for infections with a predominantly sexual mode of transmission: Secondary | ICD-10-CM | POA: Diagnosis not present

## 2019-06-06 DIAGNOSIS — R102 Pelvic and perineal pain: Secondary | ICD-10-CM | POA: Insufficient documentation

## 2019-06-06 DIAGNOSIS — Z3A01 Less than 8 weeks gestation of pregnancy: Secondary | ICD-10-CM | POA: Diagnosis not present

## 2019-06-06 DIAGNOSIS — R109 Unspecified abdominal pain: Secondary | ICD-10-CM

## 2019-06-06 LAB — PREGNANCY, URINE: Preg Test, Ur: POSITIVE — AB

## 2019-06-06 LAB — URINALYSIS, ROUTINE W REFLEX MICROSCOPIC
Bilirubin Urine: NEGATIVE
Glucose, UA: NEGATIVE mg/dL
Hgb urine dipstick: NEGATIVE
Ketones, ur: NEGATIVE mg/dL
Leukocytes,Ua: NEGATIVE
Nitrite: NEGATIVE
Protein, ur: NEGATIVE mg/dL
Specific Gravity, Urine: 1.027 (ref 1.005–1.030)
pH: 6 (ref 5.0–8.0)

## 2019-06-06 LAB — CBC
HCT: 39.3 % (ref 36.0–46.0)
Hemoglobin: 12.8 g/dL (ref 12.0–15.0)
MCH: 27.2 pg (ref 26.0–34.0)
MCHC: 32.6 g/dL (ref 30.0–36.0)
MCV: 83.6 fL (ref 80.0–100.0)
Platelets: 296 10*3/uL (ref 150–400)
RBC: 4.7 MIL/uL (ref 3.87–5.11)
RDW: 12.8 % (ref 11.5–15.5)
WBC: 8.6 10*3/uL (ref 4.0–10.5)
nRBC: 0 % (ref 0.0–0.2)

## 2019-06-06 LAB — TYPE AND SCREEN
ABO/RH(D): AB POS
Antibody Screen: NEGATIVE

## 2019-06-06 LAB — WET PREP, GENITAL
Sperm: NONE SEEN
Trich, Wet Prep: NONE SEEN
Yeast Wet Prep HPF POC: NONE SEEN

## 2019-06-06 LAB — HCG, QUANTITATIVE, PREGNANCY: hCG, Beta Chain, Quant, S: 221 m[IU]/mL — ABNORMAL HIGH (ref <5)

## 2019-06-06 NOTE — ED Triage Notes (Signed)
Pt c/o right flank pain that started just pta; pt denies any urinary sx

## 2019-06-06 NOTE — Telephone Encounter (Signed)
Rcvd call from Diane/ref office to cancel CT appt. No further reason/info given by  Shauna Hugh, per cancellation.

## 2019-06-06 NOTE — Discharge Instructions (Addendum)
You need to follow-up with your OB/GYN at 48 hours to get a repeat hormone level  If you have any new vaginal bleeding, severe abdominal pain, feel weak or dizzy or pass out in the next 2 days please call 911 or return to the ER

## 2019-06-06 NOTE — ED Notes (Signed)
Pt having [place] completed at this time.
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
## 2019-06-07 LAB — GC/CHLAMYDIA PROBE AMP (~~LOC~~) NOT AT ARMC
Chlamydia: NEGATIVE
Neisseria Gonorrhea: NEGATIVE

## 2019-06-08 ENCOUNTER — Ambulatory Visit (INDEPENDENT_AMBULATORY_CARE_PROVIDER_SITE_OTHER): Payer: Medicaid Other | Admitting: Advanced Practice Midwife

## 2019-06-08 ENCOUNTER — Other Ambulatory Visit: Payer: Self-pay

## 2019-06-08 ENCOUNTER — Encounter: Payer: Self-pay | Admitting: Advanced Practice Midwife

## 2019-06-08 ENCOUNTER — Other Ambulatory Visit: Payer: Medicaid Other

## 2019-06-08 VITALS — BP 124/74 | HR 86 | Ht 66.0 in | Wt 196.0 lb

## 2019-06-08 DIAGNOSIS — O3680X0 Pregnancy with inconclusive fetal viability, not applicable or unspecified: Secondary | ICD-10-CM | POA: Diagnosis not present

## 2019-06-08 DIAGNOSIS — Z3490 Encounter for supervision of normal pregnancy, unspecified, unspecified trimester: Secondary | ICD-10-CM

## 2019-06-08 MED ORDER — HYDROCODONE-ACETAMINOPHEN 5-325 MG PO TABS: 1.0000 | ORAL_TABLET | Freq: Four times a day (QID) | ORAL | 0 refills | Status: DC | PRN
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
## 2019-06-09 ENCOUNTER — Encounter (HOSPITAL_COMMUNITY): Payer: Self-pay | Admitting: *Deleted

## 2019-06-09 ENCOUNTER — Inpatient Hospital Stay (HOSPITAL_COMMUNITY): Payer: Medicaid Other

## 2019-06-09 ENCOUNTER — Inpatient Hospital Stay (HOSPITAL_COMMUNITY)
Admission: AD | Admit: 2019-06-09 | Discharge: 2019-06-09 | Disposition: A | Discharge status: Home / Self Care | Payer: Medicaid Other | Attending: Obstetrics and Gynecology | Admitting: Obstetrics and Gynecology

## 2019-06-09 ENCOUNTER — Other Ambulatory Visit: Payer: Self-pay

## 2019-06-09 DIAGNOSIS — O3680X0 Pregnancy with inconclusive fetal viability, not applicable or unspecified: Secondary | ICD-10-CM

## 2019-06-09 DIAGNOSIS — O99341 Other mental disorders complicating pregnancy, first trimester: Secondary | ICD-10-CM | POA: Insufficient documentation

## 2019-06-09 DIAGNOSIS — O23591 Infection of other part of genital tract in pregnancy, first trimester: Secondary | ICD-10-CM | POA: Insufficient documentation

## 2019-06-09 DIAGNOSIS — R109 Unspecified abdominal pain: Secondary | ICD-10-CM | POA: Insufficient documentation

## 2019-06-09 DIAGNOSIS — Z3A01 Less than 8 weeks gestation of pregnancy: Secondary | ICD-10-CM | POA: Insufficient documentation

## 2019-06-09 DIAGNOSIS — Z3A10 10 weeks gestation of pregnancy: Secondary | ICD-10-CM | POA: Diagnosis not present

## 2019-06-09 DIAGNOSIS — O26891 Other specified pregnancy related conditions, first trimester: Secondary | ICD-10-CM | POA: Diagnosis not present

## 2019-06-09 DIAGNOSIS — N760 Acute vaginitis: Secondary | ICD-10-CM

## 2019-06-09 DIAGNOSIS — F329 Major depressive disorder, single episode, unspecified: Secondary | ICD-10-CM | POA: Diagnosis not present

## 2019-06-09 DIAGNOSIS — Z79899 Other long term (current) drug therapy: Secondary | ICD-10-CM | POA: Diagnosis not present

## 2019-06-09 DIAGNOSIS — B9689 Other specified bacterial agents as the cause of diseases classified elsewhere: Secondary | ICD-10-CM | POA: Diagnosis not present

## 2019-06-09 DIAGNOSIS — O98813 Other maternal infectious and parasitic diseases complicating pregnancy, third trimester: Secondary | ICD-10-CM

## 2019-06-09 LAB — COMPREHENSIVE METABOLIC PANEL
ALT: 20 U/L (ref 0–44)
AST: 19 U/L (ref 15–41)
Albumin: 3.6 g/dL (ref 3.5–5.0)
Alkaline Phosphatase: 42 U/L (ref 38–126)
Anion gap: 15 (ref 5–15)
BUN: 5 mg/dL — ABNORMAL LOW (ref 6–20)
CO2: 21 mmol/L — ABNORMAL LOW (ref 22–32)
Calcium: 9.6 mg/dL (ref 8.9–10.3)
Chloride: 102 mmol/L (ref 98–111)
Creatinine, Ser: 0.75 mg/dL (ref 0.44–1.00)
GFR calc Af Amer: >60 mL/min (ref >60)
GFR calc non Af Amer: >60 mL/min (ref >60)
Glucose, Bld: 119 mg/dL — ABNORMAL HIGH (ref 70–99)
Potassium: 3.7 mmol/L (ref 3.5–5.1)
Sodium: 138 mmol/L (ref 135–145)
Total Bilirubin: 0.7 mg/dL (ref 0.3–1.2)
Total Protein: 6.7 g/dL (ref 6.5–8.1)

## 2019-06-09 LAB — CBC
HCT: 37.2 % (ref 36.0–46.0)
Hemoglobin: 12.3 g/dL (ref 12.0–15.0)
MCH: 27.2 pg (ref 26.0–34.0)
MCHC: 33.1 g/dL (ref 30.0–36.0)
MCV: 82.1 fL (ref 80.0–100.0)
Platelets: 285 10*3/uL (ref 150–400)
RBC: 4.53 MIL/uL (ref 3.87–5.11)
RDW: 12.7 % (ref 11.5–15.5)
WBC: 9.4 10*3/uL (ref 4.0–10.5)
nRBC: 0 % (ref 0.0–0.2)

## 2019-06-09 LAB — URINALYSIS, ROUTINE W REFLEX MICROSCOPIC
Bilirubin Urine: NEGATIVE
Glucose, UA: NEGATIVE mg/dL
Hgb urine dipstick: NEGATIVE
Ketones, ur: NEGATIVE mg/dL
Leukocytes,Ua: NEGATIVE
Nitrite: NEGATIVE
Protein, ur: NEGATIVE mg/dL
Specific Gravity, Urine: 1.008 (ref 1.005–1.030)
pH: 6 (ref 5.0–8.0)

## 2019-06-09 LAB — HCG, QUANTITATIVE, PREGNANCY: hCG, Beta Chain, Quant, S: 747 m[IU]/mL — ABNORMAL HIGH (ref <5)

## 2019-06-09 LAB — WET PREP, GENITAL
Sperm: NONE SEEN
Trich, Wet Prep: NONE SEEN
Yeast Wet Prep HPF POC: NONE SEEN

## 2019-06-09 LAB — BETA HCG QUANT (REF LAB): hCG Quant: 468 m[IU]/mL

## 2019-06-09 MED ORDER — OXYCODONE HCL 5 MG PO TABS
5.0000 mg | ORAL_TABLET | Freq: Once | ORAL | Status: AC
  Administered 2019-06-09: 5 mg via ORAL
  Filled 2019-06-09: qty 1

## 2019-06-09 MED ORDER — METRONIDAZOLE 500 MG PO TABS: 500.0000 mg | ORAL_TABLET | Freq: Two times a day (BID) | ORAL | 0 refills | Status: AC

## 2019-06-09 NOTE — Discharge Instructions (Signed)

## 2019-06-09 NOTE — MAU Note (Signed)
Having stomach pain, rt side is hurting.  Been like that for 3 days now.
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
## 2019-06-12 ENCOUNTER — Encounter: Payer: Self-pay | Admitting: *Deleted

## 2019-06-12 ENCOUNTER — Other Ambulatory Visit: Payer: Self-pay | Admitting: Women's Health

## 2019-06-12 DIAGNOSIS — O3680X0 Pregnancy with inconclusive fetal viability, not applicable or unspecified: Secondary | ICD-10-CM

## 2019-06-13 ENCOUNTER — Encounter (HOSPITAL_COMMUNITY): Payer: Self-pay | Admitting: *Deleted

## 2019-06-13 ENCOUNTER — Other Ambulatory Visit: Payer: Self-pay

## 2019-06-13 ENCOUNTER — Emergency Department (HOSPITAL_COMMUNITY)
Admission: EM | Admit: 2019-06-13 | Discharge: 2019-06-13 | Disposition: A | Discharge status: Home / Self Care | Payer: Medicaid Other | Attending: Emergency Medicine | Admitting: Emergency Medicine

## 2019-06-13 DIAGNOSIS — Z79899 Other long term (current) drug therapy: Secondary | ICD-10-CM | POA: Insufficient documentation

## 2019-06-13 DIAGNOSIS — Z3A00 Weeks of gestation of pregnancy not specified: Secondary | ICD-10-CM | POA: Diagnosis not present

## 2019-06-13 DIAGNOSIS — O99891 Other specified diseases and conditions complicating pregnancy: Secondary | ICD-10-CM | POA: Insufficient documentation

## 2019-06-13 DIAGNOSIS — R1032 Left lower quadrant pain: Secondary | ICD-10-CM | POA: Insufficient documentation

## 2019-06-13 DIAGNOSIS — Z3490 Encounter for supervision of normal pregnancy, unspecified, unspecified trimester: Secondary | ICD-10-CM

## 2019-06-13 DIAGNOSIS — R109 Unspecified abdominal pain: Secondary | ICD-10-CM

## 2019-06-13 LAB — I-STAT CHEM 8, ED
BUN: 8 mg/dL (ref 6–20)
Calcium, Ion: 1.21 mmol/L (ref 1.15–1.40)
Chloride: 103 mmol/L (ref 98–111)
Creatinine, Ser: 0.7 mg/dL (ref 0.44–1.00)
Glucose, Bld: 108 mg/dL — ABNORMAL HIGH (ref 70–99)
HCT: 37 % (ref 36.0–46.0)
Hemoglobin: 12.6 g/dL (ref 12.0–15.0)
Potassium: 3.5 mmol/L (ref 3.5–5.1)
Sodium: 138 mmol/L (ref 135–145)
TCO2: 21 mmol/L — ABNORMAL LOW (ref 22–32)

## 2019-06-13 LAB — I-STAT BETA HCG BLOOD, ED (MC, WL, AP ONLY): I-stat hCG, quantitative: >2000 m[IU]/mL — ABNORMAL HIGH (ref <5)

## 2019-06-13 MED ORDER — ACETAMINOPHEN 500 MG PO TABS: 500.0000 mg | ORAL_TABLET | Freq: Four times a day (QID) | ORAL | 0 refills | Status: DC | PRN

## 2019-06-13 MED ORDER — LIDOCAINE 4 % EX CREA: 1.0000 "application " | TOPICAL_CREAM | Freq: Three times a day (TID) | CUTANEOUS | 0 refills | Status: DC | PRN

## 2019-06-13 MED ORDER — ACETAMINOPHEN 500 MG PO TABS
1000.0000 mg | ORAL_TABLET | Freq: Once | ORAL | Status: AC
  Administered 2019-06-13: 1000 mg via ORAL
  Filled 2019-06-13: qty 2

## 2019-06-13 NOTE — ED Triage Notes (Signed)
Pregnant, states she has had abdominal pain since finding out she is pregnant. States she has been seen by OB  And they cannot determine if she has a tubal or not.
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
## 2019-06-13 NOTE — ED Notes (Signed)
Pt left before signing or getting vital signs.

## 2019-06-13 NOTE — Discharge Instructions (Addendum)
Your pregnancy hormone levels are increasing.  Please keep your appointments with OB/GYN and get repeat ultrasound as scheduled.  You can take 1 to 2 tablets of Tylenol every 6 hours as needed for pain.  Do not exceed more than 4000 mg of Tylenol daily.  You can apply lidocaine cream to areas of pain as well.  You can also apply heating pad to areas of pain for 20 minutes at a time 2-4 times daily.  Do some gentle stretching to avoid muscle stiffness  Follow-up with OB/GYN as scheduled.  Return to the emergency department if any concerning signs or symptoms develop such as fevers, persistent vomiting, worsening pain, blood in stool

## 2019-06-16 ENCOUNTER — Ambulatory Visit (HOSPITAL_COMMUNITY): Payer: Medicaid Other

## 2019-06-16 ENCOUNTER — Encounter (HOSPITAL_COMMUNITY): Payer: Self-pay

## 2019-06-16 LAB — SPECIMEN STATUS REPORT

## 2019-06-18 ENCOUNTER — Inpatient Hospital Stay (HOSPITAL_COMMUNITY)
Admission: EM | Admit: 2019-06-18 | Discharge: 2019-06-20 | DRG: 313 | Disposition: A | Discharge status: Home / Self Care | Payer: Medicaid Other | Attending: Internal Medicine | Admitting: Internal Medicine

## 2019-06-18 ENCOUNTER — Encounter (HOSPITAL_COMMUNITY): Payer: Self-pay | Admitting: Emergency Medicine

## 2019-06-18 ENCOUNTER — Other Ambulatory Visit: Payer: Self-pay

## 2019-06-18 ENCOUNTER — Emergency Department (HOSPITAL_COMMUNITY): Payer: Medicaid Other

## 2019-06-18 DIAGNOSIS — O0383 Metabolic disorder following complete or unspecified spontaneous abortion: Secondary | ICD-10-CM | POA: Diagnosis present

## 2019-06-18 DIAGNOSIS — K219 Gastro-esophageal reflux disease without esophagitis: Secondary | ICD-10-CM | POA: Diagnosis present

## 2019-06-18 DIAGNOSIS — R079 Chest pain, unspecified: Secondary | ICD-10-CM

## 2019-06-18 DIAGNOSIS — E876 Hypokalemia: Secondary | ICD-10-CM | POA: Diagnosis present

## 2019-06-18 DIAGNOSIS — R0789 Other chest pain: Principal | ICD-10-CM | POA: Diagnosis present

## 2019-06-18 DIAGNOSIS — F329 Major depressive disorder, single episode, unspecified: Secondary | ICD-10-CM | POA: Diagnosis present

## 2019-06-18 DIAGNOSIS — Z79899 Other long term (current) drug therapy: Secondary | ICD-10-CM

## 2019-06-18 DIAGNOSIS — Z79891 Long term (current) use of opiate analgesic: Secondary | ICD-10-CM

## 2019-06-18 DIAGNOSIS — H538 Other visual disturbances: Secondary | ICD-10-CM | POA: Diagnosis present

## 2019-06-18 DIAGNOSIS — Z888 Allergy status to other drugs, medicaments and biological substances status: Secondary | ICD-10-CM

## 2019-06-18 DIAGNOSIS — I2699 Other pulmonary embolism without acute cor pulmonale: Secondary | ICD-10-CM | POA: Diagnosis present

## 2019-06-18 DIAGNOSIS — Z20828 Contact with and (suspected) exposure to other viral communicable diseases: Secondary | ICD-10-CM | POA: Diagnosis present

## 2019-06-18 LAB — COMPREHENSIVE METABOLIC PANEL
ALT: 34 U/L (ref 0–44)
AST: 29 U/L (ref 15–41)
Albumin: 4.1 g/dL (ref 3.5–5.0)
Alkaline Phosphatase: 44 U/L (ref 38–126)
Anion gap: 9 (ref 5–15)
BUN: 12 mg/dL (ref 6–20)
CO2: 20 mmol/L — ABNORMAL LOW (ref 22–32)
Calcium: 8.7 mg/dL — ABNORMAL LOW (ref 8.9–10.3)
Chloride: 106 mmol/L (ref 98–111)
Creatinine, Ser: 0.76 mg/dL (ref 0.44–1.00)
GFR calc Af Amer: >60 mL/min (ref >60)
GFR calc non Af Amer: >60 mL/min (ref >60)
Glucose, Bld: 101 mg/dL — ABNORMAL HIGH (ref 70–99)
Potassium: 3.3 mmol/L — ABNORMAL LOW (ref 3.5–5.1)
Sodium: 135 mmol/L (ref 135–145)
Total Bilirubin: 0.9 mg/dL (ref 0.3–1.2)
Total Protein: 7.3 g/dL (ref 6.5–8.1)

## 2019-06-18 LAB — CBC
HCT: 36 % (ref 36.0–46.0)
Hemoglobin: 11.7 g/dL — ABNORMAL LOW (ref 12.0–15.0)
MCH: 27.1 pg (ref 26.0–34.0)
MCHC: 32.5 g/dL (ref 30.0–36.0)
MCV: 83.3 fL (ref 80.0–100.0)
Platelets: 282 10*3/uL (ref 150–400)
RBC: 4.32 MIL/uL (ref 3.87–5.11)
RDW: 12.9 % (ref 11.5–15.5)
WBC: 8.7 10*3/uL (ref 4.0–10.5)
nRBC: 0 % (ref 0.0–0.2)

## 2019-06-18 LAB — PROGESTERONE: Progesterone: 23 ng/mL

## 2019-06-18 LAB — SPECIMEN STATUS REPORT

## 2019-06-18 MED ORDER — KETOROLAC TROMETHAMINE 15 MG/ML IJ SOLN
15.0000 mg | Freq: Once | INTRAMUSCULAR | Status: AC
  Administered 2019-06-18: 15 mg via INTRAVENOUS
  Filled 2019-06-18: qty 1

## 2019-06-18 MED ORDER — SODIUM CHLORIDE 0.9 % IV BOLUS
1000.0000 mL | Freq: Once | INTRAVENOUS | Status: AC
  Administered 2019-06-18: 1000 mL via INTRAVENOUS
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
## 2019-06-18 NOTE — ED Notes (Signed)
Pt had pregnancy terminated 2 days ago per her report

## 2019-06-18 NOTE — ED Triage Notes (Addendum)
Pt c/o of being lightheaded and dizzy x 1 hour. Pt states that she had an abortion 2 days ago.

## 2019-06-18 NOTE — ED Notes (Signed)
Pt reports dizzy x 1 hour   Here for eval

## 2019-06-19 ENCOUNTER — Emergency Department (HOSPITAL_COMMUNITY): Payer: Medicaid Other

## 2019-06-19 ENCOUNTER — Inpatient Hospital Stay (HOSPITAL_COMMUNITY): Payer: Medicaid Other

## 2019-06-19 ENCOUNTER — Other Ambulatory Visit: Payer: Self-pay

## 2019-06-19 ENCOUNTER — Encounter (HOSPITAL_COMMUNITY): Payer: Self-pay | Admitting: Radiology

## 2019-06-19 DIAGNOSIS — E876 Hypokalemia: Secondary | ICD-10-CM | POA: Diagnosis present

## 2019-06-19 DIAGNOSIS — I2699 Other pulmonary embolism without acute cor pulmonale: Secondary | ICD-10-CM | POA: Diagnosis not present

## 2019-06-19 DIAGNOSIS — Z79891 Long term (current) use of opiate analgesic: Secondary | ICD-10-CM | POA: Diagnosis not present

## 2019-06-19 DIAGNOSIS — Z79899 Other long term (current) drug therapy: Secondary | ICD-10-CM | POA: Diagnosis not present

## 2019-06-19 DIAGNOSIS — Z20828 Contact with and (suspected) exposure to other viral communicable diseases: Secondary | ICD-10-CM | POA: Diagnosis present

## 2019-06-19 DIAGNOSIS — H538 Other visual disturbances: Secondary | ICD-10-CM | POA: Diagnosis present

## 2019-06-19 DIAGNOSIS — Z888 Allergy status to other drugs, medicaments and biological substances status: Secondary | ICD-10-CM | POA: Diagnosis not present

## 2019-06-19 DIAGNOSIS — R42 Dizziness and giddiness: Secondary | ICD-10-CM | POA: Diagnosis present

## 2019-06-19 DIAGNOSIS — K219 Gastro-esophageal reflux disease without esophagitis: Secondary | ICD-10-CM | POA: Diagnosis present

## 2019-06-19 DIAGNOSIS — R0789 Other chest pain: Secondary | ICD-10-CM | POA: Diagnosis present

## 2019-06-19 DIAGNOSIS — O0383 Metabolic disorder following complete or unspecified spontaneous abortion: Secondary | ICD-10-CM | POA: Diagnosis present

## 2019-06-19 DIAGNOSIS — F329 Major depressive disorder, single episode, unspecified: Secondary | ICD-10-CM | POA: Diagnosis present

## 2019-06-19 LAB — CBC
HCT: 33.9 % — ABNORMAL LOW (ref 36.0–46.0)
HCT: 34.4 % — ABNORMAL LOW (ref 36.0–46.0)
Hemoglobin: 10.9 g/dL — ABNORMAL LOW (ref 12.0–15.0)
Hemoglobin: 11.2 g/dL — ABNORMAL LOW (ref 12.0–15.0)
MCH: 27 pg (ref 26.0–34.0)
MCH: 27.5 pg (ref 26.0–34.0)
MCHC: 32.2 g/dL (ref 30.0–36.0)
MCHC: 32.6 g/dL (ref 30.0–36.0)
MCV: 84.1 fL (ref 80.0–100.0)
MCV: 84.3 fL (ref 80.0–100.0)
Platelets: 275 10*3/uL (ref 150–400)
Platelets: 281 10*3/uL (ref 150–400)
RBC: 4.03 MIL/uL (ref 3.87–5.11)
RBC: 4.08 MIL/uL (ref 3.87–5.11)
RDW: 13 % (ref 11.5–15.5)
RDW: 13.2 % (ref 11.5–15.5)
WBC: 9.9 10*3/uL (ref 4.0–10.5)
WBC: 8.2 10*3/uL (ref 4.0–10.5)
nRBC: 0 % (ref 0.0–0.2)
nRBC: 0 % (ref 0.0–0.2)

## 2019-06-19 LAB — COMPREHENSIVE METABOLIC PANEL
ALT: 29 U/L (ref 0–44)
AST: 26 U/L (ref 15–41)
Albumin: 3.3 g/dL — ABNORMAL LOW (ref 3.5–5.0)
Alkaline Phosphatase: 38 U/L (ref 38–126)
Anion gap: 8 (ref 5–15)
BUN: 8 mg/dL (ref 6–20)
CO2: 19 mmol/L — ABNORMAL LOW (ref 22–32)
Calcium: 8.2 mg/dL — ABNORMAL LOW (ref 8.9–10.3)
Chloride: 111 mmol/L (ref 98–111)
Creatinine, Ser: 0.7 mg/dL (ref 0.44–1.00)
GFR calc Af Amer: >60 mL/min (ref >60)
GFR calc non Af Amer: >60 mL/min (ref >60)
Glucose, Bld: 129 mg/dL — ABNORMAL HIGH (ref 70–99)
Potassium: 4.2 mmol/L (ref 3.5–5.1)
Sodium: 138 mmol/L (ref 135–145)
Total Bilirubin: 0.8 mg/dL (ref 0.3–1.2)
Total Protein: 6.3 g/dL — ABNORMAL LOW (ref 6.5–8.1)

## 2019-06-19 LAB — MAGNESIUM: Magnesium: 1.6 mg/dL — ABNORMAL LOW (ref 1.7–2.4)

## 2019-06-19 LAB — SARS CORONAVIRUS 2 (TAT 6-24 HRS): SARS Coronavirus 2: NEGATIVE

## 2019-06-19 LAB — ECHOCARDIOGRAM COMPLETE
Height: 66 in
Weight: 3120 oz

## 2019-06-19 LAB — TROPONIN I (HIGH SENSITIVITY)
Troponin I (High Sensitivity): 3 ng/L (ref <18)
Troponin I (High Sensitivity): 3 ng/L (ref <18)

## 2019-06-19 LAB — HIV ANTIBODY (ROUTINE TESTING W REFLEX): HIV Screen 4th Generation wRfx: NONREACTIVE

## 2019-06-19 LAB — TSH: TSH: 1.537 u[IU]/mL (ref 0.350–4.500)

## 2019-06-19 MED ORDER — POTASSIUM CHLORIDE 20 MEQ PO PACK
40.0000 meq | PACK | Freq: Once | ORAL | Status: AC
  Administered 2019-06-19: 40 meq via ORAL
  Filled 2019-06-19: qty 2

## 2019-06-19 MED ORDER — PANTOPRAZOLE SODIUM 40 MG PO TBEC
40.0000 mg | DELAYED_RELEASE_TABLET | Freq: Every day | ORAL | Status: DC
  Administered 2019-06-19 – 2019-06-20 (×2): 40 mg via ORAL
  Filled 2019-06-19 (×2): qty 1

## 2019-06-19 MED ORDER — HEPARIN BOLUS VIA INFUSION
4500.0000 [IU] | Freq: Once | INTRAVENOUS | Status: AC
  Administered 2019-06-19: 4500 [IU] via INTRAVENOUS

## 2019-06-19 MED ORDER — IOHEXOL 350 MG/ML SOLN
80.0000 mL | Freq: Once | INTRAVENOUS | Status: AC | PRN
  Administered 2019-06-19: 80 mL via INTRAVENOUS

## 2019-06-19 MED ORDER — ONDANSETRON HCL 4 MG PO TABS: 4.0000 mg | ORAL_TABLET | Freq: Four times a day (QID) | ORAL | Status: DC | PRN

## 2019-06-19 MED ORDER — POLYETHYLENE GLYCOL 3350 17 G PO PACK: 17.0000 g | PACK | Freq: Every day | ORAL | Status: DC | PRN

## 2019-06-19 MED ORDER — POTASSIUM CHLORIDE IN NACL 20-0.9 MEQ/L-% IV SOLN
INTRAVENOUS | Status: AC
  Administered 2019-06-19: 11:00:00 via INTRAVENOUS
  Filled 2019-06-19: qty 1000

## 2019-06-19 MED ORDER — SODIUM CHLORIDE 0.9 % IV BOLUS
1000.0000 mL | Freq: Once | INTRAVENOUS | Status: AC
  Administered 2019-06-19: 1000 mL via INTRAVENOUS

## 2019-06-19 MED ORDER — LORAZEPAM 2 MG/ML IJ SOLN
0.5000 mg | Freq: Once | INTRAMUSCULAR | Status: AC
  Administered 2019-06-19: 0.5 mg via INTRAVENOUS
  Filled 2019-06-19: qty 1

## 2019-06-19 MED ORDER — HEPARIN (PORCINE) 25000 UT/250ML-% IV SOLN
1300.0000 [IU]/h | INTRAVENOUS | Status: DC
  Administered 2019-06-19: 1300 [IU]/h via INTRAVENOUS
  Filled 2019-06-19: qty 250

## 2019-06-19 MED ORDER — HYDROCODONE-ACETAMINOPHEN 5-325 MG PO TABS
1.0000 | ORAL_TABLET | Freq: Four times a day (QID) | ORAL | Status: DC | PRN
  Administered 2019-06-19: 1 via ORAL
  Filled 2019-06-19: qty 1

## 2019-06-19 MED ORDER — TECHNETIUM TO 99M ALBUMIN AGGREGATED
1.5000 | Freq: Once | INTRAVENOUS | Status: AC | PRN
  Administered 2019-06-19: 1.6 via INTRAVENOUS

## 2019-06-19 MED ORDER — LORAZEPAM 1 MG PO TABS: 1.0000 mg | ORAL_TABLET | Freq: Three times a day (TID) | ORAL | Status: DC | PRN

## 2019-06-19 MED ORDER — MAGNESIUM SULFATE 2 GM/50ML IV SOLN
2.0000 g | Freq: Once | INTRAVENOUS | Status: AC
  Administered 2019-06-19: 2 g via INTRAVENOUS
  Filled 2019-06-19: qty 50

## 2019-06-19 MED ORDER — ONDANSETRON HCL 4 MG/2ML IJ SOLN: 4.0000 mg | Freq: Four times a day (QID) | INTRAMUSCULAR | Status: DC | PRN
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
## 2019-06-19 NOTE — Progress Notes (Addendum)
PROGRESS NOTE  Ruth Dunlap [id number] DOB: Jul 28, 1995 DOA: 06/18/2019 PCP: [facility] [place]  Brief History:  23 year old female with a history of ectopic pregnancy and recent pharmacologic termination of pregnancy presented with 1 day history of dizziness and generalized weakness that began on 06/18/2019.  The patient states that she recently took some pharmacologic medication for termination of her pregnancy approximately 2 days prior to this admission.  She stated that she had initially had some vaginal bleeding but this has significantly improved.  She denied any headache, visual disturbance or focal extremity weakness.  She has not had any fevers, chills, nausea, vomiting, diarrhea, abdominal pain.  While she was in the emergency department, the patient developed some chest tightness then shortness of breath. In the emergency department, she had low-grade temperature of 99.0 degrees Fahrenheit, but she was hemodynamically stable.  BMP, LFTs, and CBC were essentially unremarkable.  CT angiogram of the chest shows suboptimal opacification, but there was concern of severe peripheral pulmonary 2 branches in the left lower lobe suspicious of pulmonary embolus.  The patient was started on IV heparin.  Assessment/Plan: Abnormal CT angiogram chest/Chest Pain -Concerning for pulmonary embolus -The patient's clinical presentation and clinical history are concerning for pulmonary embolus -06/19/2019 CT angiogram chest as discussed above -VQ scan low probability -Check troponin--3>>>3 -cased discussed with radiology, Dr. Richmond Campbell radiologic findings of CTA chest and V/Q--pt likely does not have PE -d/c heparin and monitor x 24 hours 11/23 Echo--EF 60-65%, normal RV, trivial TR, trace MR  Hypokalemia/hypomagnesemia -Replete  GERD -continue protonix    Disposition Plan:   Home 11/24 if stable Family Communication:  No Family at bedside  Consultants:   none  Code Status:  FULL   DVT Prophylaxis:  IV Heparin   Procedures: As Listed in Progress Note Above  Antibiotics: None    Total time spent 35 minutes.  Greater than 50% spent face to face counseling and coordinating care.    Subjective: Patient denies fevers, chills, headache, chest pain, dyspnea, nausea, vomiting, diarrhea, abdominal pain, dysuria, hematuria, hematochezia, and melena.   Objective: Vitals:   06/19/19 0600 06/19/19 0630 06/19/19 0645 06/19/19 0700  BP: 107/67 106/65  100/62  Pulse: 92 89 94   Resp: (!) 23 (!) 21 13 19   Temp:      TempSrc:      SpO2: 96% 96% 100%   Weight:      Height:       No intake or output data in the 24 hours ending 06/19/19 0749 Weight change:  Exam:   General:  Pt is alert, follows commands appropriately, not in acute distress  HEENT: No icterus, No thrush, No neck mass, [place]/AT  Cardiovascular: RRR, S1/S2, no rubs, no gallops  Respiratory: CTA bilaterally, no wheezing, no crackles, no rhonchi  Abdomen: Soft/+BS, non tender, non distended, no guarding  Extremities: No edema, No lymphangitis, No petechiae, No rashes, no synovitis   Data Reviewed: I have personally reviewed following labs and imaging studies Basic Metabolic Panel: Recent Labs  Lab 06/13/19 1539 06/18/19 2245 06/19/19 0457  NA 138 135 138  K 3.5 3.3* 4.2  CL 103 106 111  CO2  --  20* 19*  GLUCOSE 108* 101* 129*  BUN 8 12 8   CREATININE 0.70 0.76 0.70  CALCIUM  --  8.7* 8.2*  MG  --   --  1.6*   Liver Function Tests: Recent Labs  Lab 06/18/19 2245  06/19/19 0457  AST 29 26  ALT 34 29  ALKPHOS 44 38  BILITOT 0.9 0.8  PROT 7.3 6.3*  ALBUMIN 4.1 3.3*   No results for input(s): LIPASE, AMYLASE in the last 168 hours. No results for input(s): AMMONIA in the last 168 hours. Coagulation Profile: No results for input(s): INR, PROTIME in the last 168 hours. CBC: Recent Labs  Lab 06/13/19 1539 06/18/19 2245 06/19/19 0457  WBC  --  8.7  9.9  HGB 12.6 11.7* 10.9*  HCT 37.0 36.0 33.9*  MCV  --  83.3 84.1  PLT  --  282 275   Cardiac Enzymes: No results for input(s): CKTOTAL, CKMB, CKMBINDEX, TROPONINI in the last 168 hours. BNP: Invalid input(s): POCBNP CBG: No results for input(s): GLUCAP in the last 168 hours. HbA1C: No results for input(s): HGBA1C in the last 72 hours. Urine analysis:    Component Value Date/Time   COLORURINE YELLOW 06/09/2019 2000   APPEARANCEUR HAZY (A) 06/09/2019 2000   APPEARANCEUR Cloudy (A) 11/19/2015 1423   LABSPEC 1.008 06/09/2019 2000   PHURINE 6.0 06/09/2019 2000   GLUCOSEU NEGATIVE 06/09/2019 2000   HGBUR NEGATIVE 06/09/2019 2000   BILIRUBINUR NEGATIVE 06/09/2019 2000   BILIRUBINUR Negative 11/19/2015 [place] 06/09/2019 2000   PROTEINUR NEGATIVE 06/09/2019 2000   UROBILINOGEN 0.2 01/31/2015 2320   NITRITE NEGATIVE 06/09/2019 2000   LEUKOCYTESUR NEGATIVE 06/09/2019 2000   Sepsis Labs: @LABRCNTIP (procalcitonin:4,lacticidven:4) ) Recent Results (from the past 240 hour(s))  Wet prep, genital     Status: Abnormal   Collection Time: 06/09/19  6:13 PM  Result Value Ref Range Status   Yeast Wet Prep HPF POC NONE SEEN NONE SEEN Final   Trich, Wet Prep NONE SEEN NONE SEEN Final   Clue Cells Wet Prep HPF POC PRESENT (A) NONE SEEN Final   WBC, Wet Prep HPF POC MANY (A) NONE SEEN Final   Sperm NONE SEEN  Final    Comment: Performed at [facility], [place] [address] [place],  [address]     Scheduled Meds:  pantoprazole  40 mg Oral QAC breakfast   Continuous Infusions:  heparin 1,300 Units/hr (06/19/19 0257)    Procedures/Studies: Xr Chest 2 View  Result Date: 06/18/2019 CLINICAL DATA:  Chest tightness for 1 hour, history of recent abortion, initial encounter EXAM: CHEST - 2 VIEW COMPARISON:  07/09/2017 FINDINGS: Cardiac shadows within normal limits. The lungs are well aerated bilaterally. No focal infiltrate or sizable effusion is seen. No bony  abnormality is noted. IMPRESSION: No active cardiopulmonary disease. Electronically Signed   By: Inez Catalina M.D.   On: 06/18/2019 23:12   Ct Angio Chest Pe W Or Wo Contrast  Result Date: 06/19/2019 CLINICAL DATA:  Central chest pain and dizziness EXAM: CT ANGIOGRAPHY CHEST WITH CONTRAST TECHNIQUE: Multidetector CT imaging of the chest was performed using the standard protocol during bolus administration of intravenous contrast. Multiplanar CT image reconstructions and MIPs were obtained to evaluate the vascular anatomy. CONTRAST:  One hundred forty OMNIPAQUE IOHEXOL 350 MG/ML SOLN COMPARISON:  None. FINDINGS: Cardiovascular: Thoracic aorta demonstrates a normal branching pattern. No cardiac enlargement is seen. Despite 2 attempts at evaluation of the pulmonary artery the degree of opacification remains suboptimal in the peripheral branches. A few of the branches of the pulmonary artery demonstrate decreased attenuation when compared to adjacent branches suspicious for small peripheral pulmonary emboli. No coronary calcifications are seen. No other focal abnormality is noted. Mediastinum/Nodes: No hilar or mediastinal adenopathy is  noted. The thoracic inlet is within normal limits. The esophagus as visualized is within normal limits. Lungs/Pleura: The lungs are well aerated bilaterally. No focal infiltrate or sizable effusion is seen. Upper Abdomen: Visualized upper abdomen is within normal limits. Musculoskeletal: No acute bony abnormality is seen. Review of the MIP images confirms the above findings. IMPRESSION: Suboptimal opacification of the pulmonary arteries despite 2 attempts at contrast administration. There are a few small peripheral branches particularly in the left lower lobe where the density of adjacent branches is somewhat different suspicious for embolus but not confirmatory. No other focal abnormality is noted. Electronically Signed   By: Inez Catalina M.D.   On: 06/19/2019 01:31   Nm  Pulmonary Perfusion  Result Date: 06/19/2019 CLINICAL DATA:  Chest tightness. History of blood clots. Suboptimal chest CTA. Evaluate for pulmonary embolism. EXAM: NUCLEAR MEDICINE PERFUSION LUNG SCAN TECHNIQUE: Perfusion images were obtained in multiple projections after intravenous injection of radiopharmaceutical. Ventilation scans intentionally deferred if perfusion scan and chest x-ray adequate for interpretation during COVID 19 epidemic. RADIOPHARMACEUTICALS:  mCi Tc-38m MAA IV COMPARISON:  Chest CT-06/19/2019; chest radiograph-06/18/2019; 09/06/2016 FINDINGS: Review of chest radiograph performed 06/18/2019 demonstrates unchanged cardiac silhouette and mediastinal contours. No focal parenchymal opacities. No pleural effusion or pneumothorax. No evidence of edema. Perfusion images: There is homogeneous distribution injected radiotracer throughout the bilateral pulmonary parenchyma without discrete segmental filling defect to suggest pulmonary embolism. IMPRESSION: Pulmonary embolism absent (very low probability of pulmonary embolism). Electronically Signed   By: Sandi Mariscal M.D.   On: 06/19/2019 07:40   US Soft Tissue Head & Neck (non-thyroid)  Result Date: 05/29/2019 CLINICAL DATA:  Neck mass EXAM: ULTRASOUND OF HEAD/NECK SOFT TISSUES TECHNIQUE: Ultrasound examination of the head and neck soft tissues was performed in the area of clinical concern. COMPARISON:  None. FINDINGS: There is a 0.6 x 0.3 x 0.4 cm ovoid hypoechoic area in the area of palpable abnormality posterior to the right sternocleidomastoid muscle. There is no internal flow demonstrated on color Doppler. IMPRESSION: Subcentimeter hypoechoic region corresponding to area of palpable abnormality. Consider further evaluation with CT of the neck. Electronically Signed   By: Macy Mis M.D.   On: 05/29/2019 16:06   US Ob Transvaginal  Result Date: 06/09/2019 CLINICAL DATA:  Viability, location.  History of ectopic. EXAM: TRANSVAGINAL OB  ULTRASOUND TECHNIQUE: Transvaginal ultrasound was performed for complete evaluation of the gestation as well as the maternal uterus, adnexal regions, and pelvic cul-de-sac. COMPARISON:  None. FINDINGS: Intrauterine gestational sac: Questionable early sac Yolk sac:  Not visualized Embryo:  Not visualized Cardiac Activity: Heart Rate:  bpm MSD: 2.1 mm   4 w   6 d CRL:     mm    w  d                  [place] EDC: Subchorionic hemorrhage:  None visualized. Maternal uterus/adnexae: No adnexal mass or free fluid. IMPRESSION: Questionable early intrauterine gestational, but no yolk sac, fetal pole, or cardiac activity yet visualized. Recommend follow-up quantitative B-HCG levels and follow-up US in 14 days to assess viability. This recommendation follows SRU consensus guidelines: Diagnostic Criteria for Nonviable Pregnancy Early in the First Trimester. [place] Med [id number]. Electronically Signed   By: Rolm Baptise M.D.   On: 06/09/2019 19:43   US Ob Less Than 14 Weeks With Ob Transvaginal  Result Date: 06/06/2019 CLINICAL DATA:  Pregnant patient with pelvic pain. Quantitative HCG 221. EXAM: OBSTETRIC <14 WK [place] AND TRANSVAGINAL  OB US TECHNIQUE: Both transabdominal and transvaginal ultrasound examinations were performed for complete evaluation of the gestation as well as the maternal uterus, adnexal regions, and pelvic cul-de-sac. Transvaginal technique was performed to assess early pregnancy. COMPARISON:  None. FINDINGS: Intrauterine gestational sac: None. Yolk sac:  None. Embryo:  None. Cardiac Activity: Not applicable. Subchorionic hemorrhage:  None visualized. Maternal uterus/adnexae: Small cystic lesion in the right ovary could be a corpus luteum cyst. IMPRESSION: Small cystic lesion in the right ovary could be a corpus luteum cyst but is nonspecific. No definite ultrasound evidence of pregnancy is identified. Given low quantitative HCG, this could be due to early gestation. Recommend follow-up quantitative  HCG. Electronically Signed   By: Inge Rise M.D.   On: 06/06/2019 06:18    Orson Eva, DO  [facility] Pager [phone]  If 7PM-7AM, please contact night-coverage [email or web address] Password [email or web address] 06/19/2019, 7:49 AM   LOS: 0 days
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
## 2019-06-19 NOTE — Progress Notes (Signed)
  Echocardiogram 2D Echocardiogram has been performed.  Ruth Dunlap 06/19/2019, 11:01 AM

## 2019-06-19 NOTE — ED Provider Notes (Signed)
Patient signed out to me by Dr. Sedonia Small to follow after IV fluids.  Patient recently received pharmacologic abortion.  She has been experiencing generalized weakness, dizziness and feeling presyncopal earlier today.  She received fluids but was still mildly tachycardic.  She then had an episode of increased tachycardia with chest tightness and shortness of breath.  CT angiography performed.  Bolus timing was not perfect, but there is suspicion for subsegmental PE in the left lower lung.  Patient continues to be tachycardic here, does not have primary care follow-up.  Do not feel she is a candidate for outpatient treatment of PE, will recommend heparinization and perhaps VQ scan in the morning to confirm diagnosis.  Will admit to hospitalist for overnight observation.   Orpah Greek, MD 06/19/19 [phone]

## 2019-06-20 LAB — CBC
HCT: 35 % — ABNORMAL LOW (ref 36.0–46.0)
Hemoglobin: 10.9 g/dL — ABNORMAL LOW (ref 12.0–15.0)
MCH: 26.7 pg (ref 26.0–34.0)
MCHC: 31.1 g/dL (ref 30.0–36.0)
MCV: 85.6 fL (ref 80.0–100.0)
Platelets: 278 10*3/uL (ref 150–400)
RBC: 4.09 MIL/uL (ref 3.87–5.11)
RDW: 13.2 % (ref 11.5–15.5)
WBC: 6.2 10*3/uL (ref 4.0–10.5)
nRBC: 0 % (ref 0.0–0.2)

## 2019-06-20 LAB — BASIC METABOLIC PANEL
Anion gap: 6 (ref 5–15)
BUN: <5 mg/dL — ABNORMAL LOW (ref 6–20)
CO2: 22 mmol/L (ref 22–32)
Calcium: 8.4 mg/dL — ABNORMAL LOW (ref 8.9–10.3)
Chloride: 111 mmol/L (ref 98–111)
Creatinine, Ser: 0.7 mg/dL (ref 0.44–1.00)
GFR calc Af Amer: >60 mL/min (ref >60)
GFR calc non Af Amer: >60 mL/min (ref >60)
Glucose, Bld: 100 mg/dL — ABNORMAL HIGH (ref 70–99)
Potassium: 3.9 mmol/L (ref 3.5–5.1)
Sodium: 139 mmol/L (ref 135–145)

## 2019-06-20 LAB — MAGNESIUM: Magnesium: 1.9 mg/dL (ref 1.7–2.4)

## 2019-06-20 NOTE — Discharge Summary (Signed)
Physician Discharge Summary  Ruth Dunlap [id number] DOB: 1996/05/26 DOA: 06/18/2019  PCP: Sandria Manly [place] date: [id number] Discharge date: 06/20/2019  Admitted From: Home Disposition:  Home  Recommendations for Outpatient Follow-up:  1. Follow up with PCP in 1-2 weeks 2. Please obtain BMP/CBC in one week     Discharge Condition: Stable CODE STATUS: FULL Diet recommendation:  Regular   Brief/Interim Summary: 23 year old female with a history of ectopic pregnancy and recent pharmacologic termination of pregnancy presented with 1 day history of dizziness and generalized weakness that began on 06/18/2019.  The patient states that she recently took some pharmacologic medication for termination of her pregnancy approximately 2 days prior to this admission.  She stated that she had initially had some vaginal bleeding but this has significantly improved.  She denied any headache, visual disturbance or focal extremity weakness.  She has not had any fevers, chills, nausea, vomiting, diarrhea, abdominal pain.  While she was in the emergency department, the patient developed some chest tightness then shortness of breath. In the emergency department, she had low-grade temperature of 99.0 degrees Fahrenheit, but she was hemodynamically stable.  BMP, LFTs, and CBC were essentially unremarkable.  CT angiogram of the chest shows suboptimal opacification, but there was concern of severe peripheral pulmonary 2 branches in the left lower lobe suspicious of pulmonary embolus.  The patient was started on IV heparin initially before V/Q scan.  V/Q scan was very low probability of PE.  Case was discussed with radiology, Dr. Thornton Papas.  Given constellation of presentation and radiographic studies, pt does not likely have PE.  Heparin was d/ced and pt monitored clinically.  She remained stable hemodynamically without any hypoxia.  She was d/ced in stable condition.  Discharge Diagnoses:   Abnormal CT angiogram chest/Chest Pain -Concerning for pulmonary embolus -The patient's clinical presentation and clinical history are concerning for pulmonary embolus -06/19/2019 CT angiogram chest as discussed above -VQ scan low probability -Check troponin--3>>>3 -cased discussed with radiology, Dr. Richmond Campbell radiologic findings of CTA chest and V/Q--pt likely does not have PE -d/c heparin and monitor x 24 hours 11/23 Echo--EF 60-65%, normal RV, trivial TR, trace MR  Hypokalemia/hypomagnesemia -Replete  GERD -continue protonix   Discharge Instructions   Allergies as of 06/20/2019      Reactions   Reglan [metoclopramide] Other (See Comments)   Involuntary muslce movements, muscle spasms      Medication List    STOP taking these medications   acetaminophen 500 MG tablet Commonly known as: TYLENOL   HYDROcodone-acetaminophen 5-325 MG tablet Commonly known as: Norco   ipratropium 0.03 % nasal spray Commonly known as: ATROVENT   lidocaine 4 % cream Commonly known as: LMX   LORazepam 1 MG tablet Commonly known as: Ativan   misoprostol 100 MCG tablet Commonly known as: CYTOTEC   pantoprazole 40 MG tablet Commonly known as: PROTONIX       Allergies  Allergen Reactions   Reglan [Metoclopramide] Other (See Comments)    Involuntary muslce movements, muscle spasms    Consultations:  none   Procedures/Studies: Xr Chest 2 View  Result Date: 06/18/2019 CLINICAL DATA:  Chest tightness for 1 hour, history of recent abortion, initial encounter EXAM: CHEST - 2 VIEW COMPARISON:  07/09/2017 FINDINGS: Cardiac shadows within normal limits. The lungs are well aerated bilaterally. No focal infiltrate or sizable effusion is seen. No bony abnormality is noted. IMPRESSION: No active cardiopulmonary disease. Electronically Signed   By: Inez Catalina M.D.   On:  06/18/2019 23:12   Ct Angio Chest Pe W Or Wo Contrast  Result Date: 06/19/2019 CLINICAL DATA:  Central  chest pain and dizziness EXAM: CT ANGIOGRAPHY CHEST WITH CONTRAST TECHNIQUE: Multidetector CT imaging of the chest was performed using the standard protocol during bolus administration of intravenous contrast. Multiplanar CT image reconstructions and MIPs were obtained to evaluate the vascular anatomy. CONTRAST:  One hundred forty OMNIPAQUE IOHEXOL 350 MG/ML SOLN COMPARISON:  None. FINDINGS: Cardiovascular: Thoracic aorta demonstrates a normal branching pattern. No cardiac enlargement is seen. Despite 2 attempts at evaluation of the pulmonary artery the degree of opacification remains suboptimal in the peripheral branches. A few of the branches of the pulmonary artery demonstrate decreased attenuation when compared to adjacent branches suspicious for small peripheral pulmonary emboli. No coronary calcifications are seen. No other focal abnormality is noted. Mediastinum/Nodes: No hilar or mediastinal adenopathy is noted. The thoracic inlet is within normal limits. The esophagus as visualized is within normal limits. Lungs/Pleura: The lungs are well aerated bilaterally. No focal infiltrate or sizable effusion is seen. Upper Abdomen: Visualized upper abdomen is within normal limits. Musculoskeletal: No acute bony abnormality is seen. Review of the MIP images confirms the above findings. IMPRESSION: Suboptimal opacification of the pulmonary arteries despite 2 attempts at contrast administration. There are a few small peripheral branches particularly in the left lower lobe where the density of adjacent branches is somewhat different suspicious for embolus but not confirmatory. No other focal abnormality is noted. Electronically Signed   By: Inez Catalina M.D.   On: 06/19/2019 01:31   Nm Pulmonary Perfusion  Result Date: 06/19/2019 CLINICAL DATA:  Chest tightness. History of blood clots. Suboptimal chest CTA. Evaluate for pulmonary embolism. EXAM: NUCLEAR MEDICINE PERFUSION LUNG SCAN TECHNIQUE: Perfusion images were  obtained in multiple projections after intravenous injection of radiopharmaceutical. Ventilation scans intentionally deferred if perfusion scan and chest x-ray adequate for interpretation during COVID 19 epidemic. RADIOPHARMACEUTICALS:  mCi Tc-69m MAA IV COMPARISON:  Chest CT-06/19/2019; chest radiograph-06/18/2019; 09/06/2016 FINDINGS: Review of chest radiograph performed 06/18/2019 demonstrates unchanged cardiac silhouette and mediastinal contours. No focal parenchymal opacities. No pleural effusion or pneumothorax. No evidence of edema. Perfusion images: There is homogeneous distribution injected radiotracer throughout the bilateral pulmonary parenchyma without discrete segmental filling defect to suggest pulmonary embolism. IMPRESSION: Pulmonary embolism absent (very low probability of pulmonary embolism). Electronically Signed   By: Sandi Mariscal M.D.   On: 06/19/2019 07:40   US Soft Tissue Head & Neck (non-thyroid)  Result Date: 05/29/2019 CLINICAL DATA:  Neck mass EXAM: ULTRASOUND OF HEAD/NECK SOFT TISSUES TECHNIQUE: Ultrasound examination of the head and neck soft tissues was performed in the area of clinical concern. COMPARISON:  None. FINDINGS: There is a 0.6 x 0.3 x 0.4 cm ovoid hypoechoic area in the area of palpable abnormality posterior to the right sternocleidomastoid muscle. There is no internal flow demonstrated on color Doppler. IMPRESSION: Subcentimeter hypoechoic region corresponding to area of palpable abnormality. Consider further evaluation with CT of the neck. Electronically Signed   By: Macy Mis M.D.   On: 05/29/2019 16:06   US Ob Transvaginal  Result Date: 06/09/2019 CLINICAL DATA:  Viability, location.  History of ectopic. EXAM: TRANSVAGINAL OB ULTRASOUND TECHNIQUE: Transvaginal ultrasound was performed for complete evaluation of the gestation as well as the maternal uterus, adnexal regions, and pelvic cul-de-sac. COMPARISON:  None. FINDINGS: Intrauterine gestational sac:  Questionable early sac Yolk sac:  Not visualized Embryo:  Not visualized Cardiac Activity: Heart Rate:  bpm MSD: 2.1 mm  4 w   6 d CRL:     mm    w  d                  [place] EDC: Subchorionic hemorrhage:  None visualized. Maternal uterus/adnexae: No adnexal mass or free fluid. IMPRESSION: Questionable early intrauterine gestational, but no yolk sac, fetal pole, or cardiac activity yet visualized. Recommend follow-up quantitative B-HCG levels and follow-up US in 14 days to assess viability. This recommendation follows SRU consensus guidelines: Diagnostic Criteria for Nonviable Pregnancy Early in the First Trimester. [place] [place] Med [id number]. Electronically Signed   By: Rolm Baptise M.D.   On: 06/09/2019 19:43   US Ob Less Than 14 Weeks With Ob Transvaginal  Result Date: 06/06/2019 CLINICAL DATA:  Pregnant patient with pelvic pain. Quantitative HCG 221. EXAM: OBSTETRIC <14 WK [place] AND TRANSVAGINAL OB US TECHNIQUE: Both transabdominal and transvaginal ultrasound examinations were performed for complete evaluation of the gestation as well as the maternal uterus, adnexal regions, and pelvic cul-de-sac. Transvaginal technique was performed to assess early pregnancy. COMPARISON:  None. FINDINGS: Intrauterine gestational sac: None. Yolk sac:  None. Embryo:  None. Cardiac Activity: Not applicable. Subchorionic hemorrhage:  None visualized. Maternal uterus/adnexae: Small cystic lesion in the right ovary could be a corpus luteum cyst. IMPRESSION: Small cystic lesion in the right ovary could be a corpus luteum cyst but is nonspecific. No definite ultrasound evidence of pregnancy is identified. Given low quantitative HCG, this could be due to early gestation. Recommend follow-up quantitative HCG. Electronically Signed   By: Inge Rise M.D.   On: 06/06/2019 06:18         Discharge Exam: Vitals:   06/19/19 2137 06/20/19 0437  BP: 123/63 (!) 109/59  Pulse: 84 64  Resp: 16 18  Temp: 98.6 F (37 C) 98.2 F  (36.8 C)  SpO2: 100% 100%   Vitals:   06/19/19 1200 06/19/19 1441 06/19/19 2137 06/20/19 0437  BP: 114/67 112/68 123/63 (!) 109/59  Pulse: 90 88 84 64  Resp: 18 18 16 18   Temp:  98.9 F (37.2 C) 98.6 F (37 C) 98.2 F (36.8 C)  TempSrc:  Oral Oral Oral  SpO2: 98% 100% 100% 100%  Weight:  87.9 kg    Height:  5\' 6"  (1.676 m)      General: Pt is alert, awake, not in acute distress Cardiovascular: RRR, S1/S2 +, no rubs, no gallops Respiratory: CTA bilaterally, no wheezing, no rhonchi Abdominal: Soft, NT, ND, bowel sounds + Extremities: no edema, no cyanosis   The results of significant diagnostics from this hospitalization (including imaging, microbiology, ancillary and laboratory) are listed below for reference.    Significant Diagnostic Studies: Xr Chest 2 View  Result Date: 06/18/2019 CLINICAL DATA:  Chest tightness for 1 hour, history of recent abortion, initial encounter EXAM: CHEST - 2 VIEW COMPARISON:  07/09/2017 FINDINGS: Cardiac shadows within normal limits. The lungs are well aerated bilaterally. No focal infiltrate or sizable effusion is seen. No bony abnormality is noted. IMPRESSION: No active cardiopulmonary disease. Electronically Signed   By: Inez Catalina M.D.   On: 06/18/2019 23:12   Ct Angio Chest Pe W Or Wo Contrast  Result Date: 06/19/2019 CLINICAL DATA:  Central chest pain and dizziness EXAM: CT ANGIOGRAPHY CHEST WITH CONTRAST TECHNIQUE: Multidetector CT imaging of the chest was performed using the standard protocol during bolus administration of intravenous contrast. Multiplanar CT image reconstructions and MIPs were obtained to evaluate the vascular anatomy. CONTRAST:  One hundred forty OMNIPAQUE IOHEXOL 350 MG/ML SOLN COMPARISON:  None. FINDINGS: Cardiovascular: Thoracic aorta demonstrates a normal branching pattern. No cardiac enlargement is seen. Despite 2 attempts at evaluation of the pulmonary artery the degree of opacification remains suboptimal in the  peripheral branches. A few of the branches of the pulmonary artery demonstrate decreased attenuation when compared to adjacent branches suspicious for small peripheral pulmonary emboli. No coronary calcifications are seen. No other focal abnormality is noted. Mediastinum/Nodes: No hilar or mediastinal adenopathy is noted. The thoracic inlet is within normal limits. The esophagus as visualized is within normal limits. Lungs/Pleura: The lungs are well aerated bilaterally. No focal infiltrate or sizable effusion is seen. Upper Abdomen: Visualized upper abdomen is within normal limits. Musculoskeletal: No acute bony abnormality is seen. Review of the MIP images confirms the above findings. IMPRESSION: Suboptimal opacification of the pulmonary arteries despite 2 attempts at contrast administration. There are a few small peripheral branches particularly in the left lower lobe where the density of adjacent branches is somewhat different suspicious for embolus but not confirmatory. No other focal abnormality is noted. Electronically Signed   By: Inez Catalina M.D.   On: 06/19/2019 01:31   Nm Pulmonary Perfusion  Result Date: 06/19/2019 CLINICAL DATA:  Chest tightness. History of blood clots. Suboptimal chest CTA. Evaluate for pulmonary embolism. EXAM: NUCLEAR MEDICINE PERFUSION LUNG SCAN TECHNIQUE: Perfusion images were obtained in multiple projections after intravenous injection of radiopharmaceutical. Ventilation scans intentionally deferred if perfusion scan and chest x-ray adequate for interpretation during COVID 19 epidemic. RADIOPHARMACEUTICALS:  mCi Tc-31m MAA IV COMPARISON:  Chest CT-06/19/2019; chest radiograph-06/18/2019; 09/06/2016 FINDINGS: Review of chest radiograph performed 06/18/2019 demonstrates unchanged cardiac silhouette and mediastinal contours. No focal parenchymal opacities. No pleural effusion or pneumothorax. No evidence of edema. Perfusion images: There is homogeneous distribution injected  radiotracer throughout the bilateral pulmonary parenchyma without discrete segmental filling defect to suggest pulmonary embolism. IMPRESSION: Pulmonary embolism absent (very low probability of pulmonary embolism). Electronically Signed   By: Sandi Mariscal M.D.   On: 06/19/2019 07:40   US Soft Tissue Head & Neck (non-thyroid)  Result Date: 05/29/2019 CLINICAL DATA:  Neck mass EXAM: ULTRASOUND OF HEAD/NECK SOFT TISSUES TECHNIQUE: Ultrasound examination of the head and neck soft tissues was performed in the area of clinical concern. COMPARISON:  None. FINDINGS: There is a 0.6 x 0.3 x 0.4 cm ovoid hypoechoic area in the area of palpable abnormality posterior to the right sternocleidomastoid muscle. There is no internal flow demonstrated on color Doppler. IMPRESSION: Subcentimeter hypoechoic region corresponding to area of palpable abnormality. Consider further evaluation with CT of the neck. Electronically Signed   By: Macy Mis M.D.   On: 05/29/2019 16:06   US Ob Transvaginal  Result Date: 06/09/2019 CLINICAL DATA:  Viability, location.  History of ectopic. EXAM: TRANSVAGINAL OB ULTRASOUND TECHNIQUE: Transvaginal ultrasound was performed for complete evaluation of the gestation as well as the maternal uterus, adnexal regions, and pelvic cul-de-sac. COMPARISON:  None. FINDINGS: Intrauterine gestational sac: Questionable early sac Yolk sac:  Not visualized Embryo:  Not visualized Cardiac Activity: Heart Rate:  bpm MSD: 2.1 mm   4 w   6 d CRL:     mm    w  d                  [place] EDC: Subchorionic hemorrhage:  None visualized. Maternal uterus/adnexae: No adnexal mass or free fluid. IMPRESSION: Questionable early intrauterine gestational, but no yolk sac, fetal pole, or cardiac activity  yet visualized. Recommend follow-up quantitative B-HCG levels and follow-up US in 14 days to assess viability. This recommendation follows SRU consensus guidelines: Diagnostic Criteria for Nonviable Pregnancy Early in the First  Trimester. [place] [place] Med 2013KT:[id number]. Electronically Signed   By: Rolm Baptise M.D.   On: 06/09/2019 19:43   US Ob Less Than 14 Weeks With Ob Transvaginal  Result Date: 06/06/2019 CLINICAL DATA:  Pregnant patient with pelvic pain. Quantitative HCG 221. EXAM: OBSTETRIC <14 WK [place] AND TRANSVAGINAL OB US TECHNIQUE: Both transabdominal and transvaginal ultrasound examinations were performed for complete evaluation of the gestation as well as the maternal uterus, adnexal regions, and pelvic cul-de-sac. Transvaginal technique was performed to assess early pregnancy. COMPARISON:  None. FINDINGS: Intrauterine gestational sac: None. Yolk sac:  None. Embryo:  None. Cardiac Activity: Not applicable. Subchorionic hemorrhage:  None visualized. Maternal uterus/adnexae: Small cystic lesion in the right ovary could be a corpus luteum cyst. IMPRESSION: Small cystic lesion in the right ovary could be a corpus luteum cyst but is nonspecific. No definite ultrasound evidence of pregnancy is identified. Given low quantitative HCG, this could be due to early gestation. Recommend follow-up quantitative HCG. Electronically Signed   By: Inge Rise M.D.   On: 06/06/2019 06:18     Microbiology: Recent Results (from the past 240 hour(s))  SARS CORONAVIRUS 2 (Bristyn Kulesza 6-24 HRS) Nasopharyngeal Nasopharyngeal Swab     Status: None   Collection Time: 06/19/19  3:01 AM   Specimen: Nasopharyngeal Swab  Result Value Ref Range Status   SARS Coronavirus 2 NEGATIVE NEGATIVE Final    Comment: (NOTE) SARS-CoV-2 target nucleic acids are NOT DETECTED. The SARS-CoV-2 RNA is generally detectable in upper and lower respiratory specimens during the acute phase of infection. Negative results do not preclude SARS-CoV-2 infection, do not rule out co-infections with other pathogens, and should not be used as the sole basis for treatment or other patient management decisions. Negative results must be combined with clinical  observations, patient history, and epidemiological information. The expected result is Negative. Fact Sheet for Patients: SugarRoll.be Fact Sheet for Healthcare Providers: https://www.woods-mathews.com/ This test is not yet approved or cleared by the [place] FDA and  has been authorized for detection and/or diagnosis of SARS-CoV-2 by FDA under an Emergency Use Authorization (EUA). This EUA will remain  in effect (meaning this test can be used) for the duration of the COVID-19 declaration under Section 56 4(b)(1) of the Act, 21 U.S.C. section 360bbb-3(b)(1), unless the authorization is terminated or revoked sooner. Performed at [facility], [place] [address] [place], [place] [address]      Labs: Basic Metabolic Panel: Recent Labs  Lab 06/13/19 1539 06/18/19 2245 06/19/19 0457 06/20/19 0429  NA 138 135 138 139  K 3.5 3.3* 4.2 3.9  CL 103 106 111 111  CO2  --  20* 19* 22  GLUCOSE 108* 101* 129* 100*  BUN 8 12 8  <5*  CREATININE 0.70 0.76 0.70 0.70  CALCIUM  --  8.7* 8.2* 8.4*  MG  --   --  1.6* 1.9   Liver Function Tests: Recent Labs  Lab 06/18/19 2245 06/19/19 0457  AST 29 26  ALT 34 29  ALKPHOS 44 38  BILITOT 0.9 0.8  PROT 7.3 6.3*  ALBUMIN 4.1 3.3*   No results for input(s): LIPASE, AMYLASE in the last 168 hours. No results for input(s): AMMONIA in the last 168 hours. CBC: Recent Labs  Lab 06/13/19 1539 06/18/19 2245 06/19/19 0457 06/19/19  1257 06/20/19 0429  WBC  --  8.7 9.9 8.2 6.2  HGB 12.6 11.7* 10.9* 11.2* 10.9*  HCT 37.0 36.0 33.9* 34.4* 35.0*  MCV  --  83.3 84.1 84.3 85.6  PLT  --  282 275 281 278   Cardiac Enzymes: No results for input(s): CKTOTAL, CKMB, CKMBINDEX, TROPONINI in the last 168 hours. BNP: Invalid input(s): POCBNP CBG: No results for input(s): GLUCAP in the last 168 hours.  Time coordinating discharge:  36 minutes  Signed:  Orson Eva, DO [facility] Pager:  [phone] 06/20/2019, 2:01 PM

## 2019-06-27 ENCOUNTER — Other Ambulatory Visit: Payer: Self-pay | Admitting: Obstetrics and Gynecology

## 2019-06-27 DIAGNOSIS — O3680X0 Pregnancy with inconclusive fetal viability, not applicable or unspecified: Secondary | ICD-10-CM

## 2019-06-28 ENCOUNTER — Other Ambulatory Visit: Payer: Medicaid Other

## 2019-06-28 ENCOUNTER — Encounter: Payer: Self-pay | Admitting: *Deleted

## 2019-06-28 ENCOUNTER — Telehealth: Payer: Self-pay | Admitting: *Deleted

## 2019-06-28 NOTE — Pre-Procedure Instructions (Signed)
Note sent to Dr Gala Romney about patients recent hospital stay to see if he feels we should proceed at this time.

## 2019-06-28 NOTE — Patient Instructions (Signed)
Ruth Dunlap  06/28/2019     @PREFPERIOPPHARMACY @   Your procedure is scheduled on  07/03/2019  Report to [name] at  [place].M.  Call this number if you have problems the morning of surgery:  [phone]   Remember:  Follow the diet and prep instructions given to you by Dr Roseanne Kaufman office.                    Take these medicines the morning of surgery with A SIP OF WATER none    Do not wear jewelry, make-up or nail polish.  Do not wear lotions, powders, or perfumes. Please wear deodorant and brush your teeth.  Do not shave 48 hours prior to surgery.  Men may shave face and neck.  Do not bring valuables to the hospital.  [facility] is not responsible for any belongings or valuables.  Contacts, dentures or bridgework may not be worn into surgery.  Leave your suitcase in the car.  After surgery it may be brought to your room.  For patients admitted to the hospital, discharge time will be determined by your treatment team.  Patients discharged the day of surgery will not be allowed to drive home.   Name and phone number of your driver:   family Special instructions:  none  Please read over the following fact sheets that you were given. Anesthesia Post-op Instructions and Care and Recovery After Surgery       Upper Endoscopy, Adult, Care After This sheet gives you information about how to care for yourself after your procedure. Your health care provider may also give you more specific instructions. If you have problems or questions, contact your health care provider. What can I expect after the procedure? After the procedure, it is common to have:  A sore throat.  Mild stomach pain or discomfort.  Bloating.  Nausea. Follow these instructions at home:   Follow instructions from your health care provider about what to eat or drink after your procedure.  Return to your normal activities as told by your health care provider. Ask your health care  provider what activities are safe for you.  Take over-the-counter and prescription medicines only as told by your health care provider.  Do not drive for 24 hours if you were given a sedative during your procedure.  Keep all follow-up visits as told by your health care provider. This is important. Contact a health care provider if you have:  A sore throat that lasts longer than one day.  Trouble swallowing. Get help right away if:  You vomit blood or your vomit looks like coffee grounds.  You have: ? A fever. ? Bloody, black, or tarry stools. ? A severe sore throat or you cannot swallow. ? Difficulty breathing. ? Severe pain in your chest or abdomen. Summary  After the procedure, it is common to have a sore throat, mild stomach discomfort, bloating, and nausea.  Do not drive for 24 hours if you were given a sedative during the procedure.  Follow instructions from your health care provider about what to eat or drink after your procedure.  Return to your normal activities as told by your health care provider. This information is not intended to replace advice given to you by your health care provider. Make sure you discuss any questions you have with your health care provider. Document Released: 01/12/2012 Document Revised: 01/04/2018 Document Reviewed: 12/13/2017 Elsevier Patient Education  [place].  Colonoscopy, Adult, Care After This sheet gives you information about how to care for yourself after your procedure. Your health care provider may also give you more specific instructions. If you have problems or questions, contact your health care provider. What can I expect after the procedure? After the procedure, it is common to have:  A small amount of blood in your stool for 24 hours after the procedure.  Some gas.  Mild abdominal cramping or bloating. Follow these instructions at home: General instructions  For the first 24 hours after the procedure: ? Do  not drive or use machinery. ? Do not sign important documents. ? Do not drink alcohol. ? Do your regular daily activities at a slower pace than normal. ? Eat soft, easy-to-digest foods.  Take over-the-counter or prescription medicines only as told by your health care provider. Relieving cramping and bloating   Try walking around when you have cramps or feel bloated.  Apply heat to your abdomen as told by your health care provider. Use a heat source that your health care provider recommends, such as a moist heat pack or a heating pad. ? Place a towel between your skin and the heat source. ? Leave the heat on for 20-30 minutes. ? Remove the heat if your skin turns bright red. This is especially important if you are unable to feel pain, heat, or cold. You may have a greater risk of getting burned. Eating and drinking   Drink enough fluid to keep your urine pale yellow.  Resume your normal diet as instructed by your health care provider. Avoid heavy or fried foods that are hard to digest.  Avoid drinking alcohol for as long as instructed by your health care provider. Contact a health care provider if:  You have blood in your stool 2-3 days after the procedure. Get help right away if:  You have more than a small spotting of blood in your stool.  You pass large blood clots in your stool.  Your abdomen is swollen.  You have nausea or vomiting.  You have a fever.  You have increasing abdominal pain that is not relieved with medicine. Summary  After the procedure, it is common to have a small amount of blood in your stool. You may also have mild abdominal cramping and bloating.  For the first 24 hours after the procedure, do not drive or use machinery, sign important documents, or drink alcohol.  Contact your health care provider if you have a lot of blood in your stool, nausea or vomiting, a fever, or increased abdominal pain. This information is not intended to replace advice  given to you by your health care provider. Make sure you discuss any questions you have with your health care provider. Document Released: 02/25/2004 Document Revised: 05/05/2017 Document Reviewed: 09/24/2015 Elsevier Patient Education  2020 [place] After These instructions provide you with information about caring for yourself after your procedure. Your health care provider may also give you more specific instructions. Your treatment has been planned according to current medical practices, but problems sometimes occur. Call your health care provider if you have any problems or questions after your procedure. What can I expect after the procedure? After your procedure, you may:  Feel sleepy for several hours.  Feel clumsy and have poor balance for several hours.  Feel forgetful about what happened after the procedure.  Have poor judgment for several hours.  Feel nauseous or vomit.  Have a sore throat if you had a breathing tube during the procedure. Follow these instructions at home: For at least 24 hours after the procedure:      Have a responsible adult stay with you. It is important to have someone help care for you until you are awake and alert.  Rest as needed.  Do not: ? Participate in activities in which you could fall or become injured. ? Drive. ? Use heavy machinery. ? Drink alcohol. ? Take sleeping pills or medicines that cause drowsiness. ? Make important decisions or sign legal documents. ? Take care of children on your own. Eating and drinking  Follow the diet that is recommended by your health care provider.  If you vomit, drink water, juice, or soup when you can drink without vomiting.  Make sure you have little or no nausea before eating solid foods. General instructions  Take over-the-counter and prescription medicines only as told by your health care provider.  If you have sleep apnea, surgery and certain  medicines can increase your risk for breathing problems. Follow instructions from your health care provider about wearing your sleep device: ? Anytime you are sleeping, including during daytime naps. ? While taking prescription pain medicines, sleeping medicines, or medicines that make you drowsy.  If you smoke, do not smoke without supervision.  Keep all follow-up visits as told by your health care provider. This is important. Contact a health care provider if:  You keep feeling nauseous or you keep vomiting.  You feel light-headed.  You develop a rash.  You have a fever. Get help right away if:  You have trouble breathing. Summary  For several hours after your procedure, you may feel sleepy and have poor judgment.  Have a responsible adult stay with you for at least 24 hours or until you are awake and alert. This information is not intended to replace advice given to you by your health care provider. Make sure you discuss any questions you have with your health care provider. Document Released: 11/03/2015 Document Revised: 10/11/2017 Document Reviewed: 11/03/2015 Elsevier Patient Education  2020 [employer].

## 2019-06-28 NOTE — Telephone Encounter (Signed)
Per RMR Significant health issues since she was seen in the office; We need to cancel double for now;  Will see her back in the office in about a month and go from there.    Called pt, goes straight to VM, but VM full.

## 2019-06-28 NOTE — Telephone Encounter (Signed)
Phone goes straight to VM. I have sent patient a [facility] message. Called endo and LMOVM to canCEL

## 2019-06-28 NOTE — Telephone Encounter (Signed)
Patient called. Made aware of below and pending appt. She voiced understanding.

## 2019-06-30 ENCOUNTER — Encounter (HOSPITAL_COMMUNITY): Payer: Self-pay

## 2019-06-30 ENCOUNTER — Other Ambulatory Visit (HOSPITAL_COMMUNITY)
Admission: RE | Admit: 2019-06-30 | Discharge: 2019-06-30 | Disposition: A | Discharge status: Home / Self Care | Payer: Medicaid Other | Source: Ambulatory Visit | Attending: Internal Medicine | Admitting: Internal Medicine

## 2019-06-30 ENCOUNTER — Encounter (HOSPITAL_COMMUNITY)
Admission: RE | Admit: 2019-06-30 | Discharge: 2019-06-30 | Disposition: A | Discharge status: Home / Self Care | Payer: Medicaid Other | Source: Ambulatory Visit | Attending: Internal Medicine | Admitting: Internal Medicine

## 2019-07-01 ENCOUNTER — Emergency Department (HOSPITAL_COMMUNITY)
Admission: EM | Admit: 2019-07-01 | Discharge: 2019-07-01 | Disposition: A | Discharge status: Home / Self Care | Payer: Medicaid Other | Source: Home / Self Care | Attending: Emergency Medicine | Admitting: Emergency Medicine

## 2019-07-01 ENCOUNTER — Encounter (HOSPITAL_COMMUNITY): Payer: Self-pay | Admitting: Emergency Medicine

## 2019-07-01 ENCOUNTER — Other Ambulatory Visit: Payer: Self-pay

## 2019-07-01 ENCOUNTER — Emergency Department (HOSPITAL_COMMUNITY)
Admission: EM | Admit: 2019-07-01 | Discharge: 2019-07-01 | Disposition: A | Discharge status: Home / Self Care | Payer: Medicaid Other | Attending: Emergency Medicine | Admitting: Emergency Medicine

## 2019-07-01 DIAGNOSIS — M545 Low back pain, unspecified: Secondary | ICD-10-CM

## 2019-07-01 DIAGNOSIS — M79661 Pain in right lower leg: Secondary | ICD-10-CM | POA: Insufficient documentation

## 2019-07-01 DIAGNOSIS — T148XXA Other injury of unspecified body region, initial encounter: Secondary | ICD-10-CM

## 2019-07-01 DIAGNOSIS — M79672 Pain in left foot: Secondary | ICD-10-CM

## 2019-07-01 DIAGNOSIS — M79662 Pain in left lower leg: Secondary | ICD-10-CM | POA: Diagnosis not present

## 2019-07-01 LAB — URINALYSIS, ROUTINE W REFLEX MICROSCOPIC
Bilirubin Urine: NEGATIVE
Glucose, UA: NEGATIVE mg/dL
Hgb urine dipstick: NEGATIVE
Ketones, ur: NEGATIVE mg/dL
Leukocytes,Ua: NEGATIVE
Nitrite: NEGATIVE
Protein, ur: NEGATIVE mg/dL
Specific Gravity, Urine: 1.029 (ref 1.005–1.030)
pH: 6 (ref 5.0–8.0)

## 2019-07-01 LAB — CBC WITH DIFFERENTIAL/PLATELET
Abs Immature Granulocytes: 0.01 10*3/uL (ref 0.00–0.07)
Basophils Absolute: 0 10*3/uL (ref 0.0–0.1)
Basophils Relative: 1 %
Eosinophils Absolute: 0.1 10*3/uL (ref 0.0–0.5)
Eosinophils Relative: 1 %
HCT: 40 % (ref 36.0–46.0)
Hemoglobin: 12.5 g/dL (ref 12.0–15.0)
Immature Granulocytes: 0 %
Lymphocytes Relative: 44 %
Lymphs Abs: 2.5 10*3/uL (ref 0.7–4.0)
MCH: 26.9 pg (ref 26.0–34.0)
MCHC: 31.3 g/dL (ref 30.0–36.0)
MCV: 86 fL (ref 80.0–100.0)
Monocytes Absolute: 0.6 10*3/uL (ref 0.1–1.0)
Monocytes Relative: 10 %
Neutro Abs: 2.6 10*3/uL (ref 1.7–7.7)
Neutrophils Relative %: 44 %
Platelets: 331 10*3/uL (ref 150–400)
RBC: 4.65 MIL/uL (ref 3.87–5.11)
RDW: 13.3 % (ref 11.5–15.5)
WBC: 5.8 10*3/uL (ref 4.0–10.5)
nRBC: 0 % (ref 0.0–0.2)

## 2019-07-01 LAB — BASIC METABOLIC PANEL
Anion gap: 7 (ref 5–15)
BUN: 16 mg/dL (ref 6–20)
CO2: 26 mmol/L (ref 22–32)
Calcium: 9.4 mg/dL (ref 8.9–10.3)
Chloride: 107 mmol/L (ref 98–111)
Creatinine, Ser: 0.85 mg/dL (ref 0.44–1.00)
GFR calc Af Amer: >60 mL/min (ref >60)
GFR calc non Af Amer: >60 mL/min (ref >60)
Glucose, Bld: 115 mg/dL — ABNORMAL HIGH (ref 70–99)
Potassium: 3.6 mmol/L (ref 3.5–5.1)
Sodium: 140 mmol/L (ref 135–145)

## 2019-07-01 LAB — PREGNANCY, URINE: Preg Test, Ur: NEGATIVE

## 2019-07-01 MED ORDER — METHOCARBAMOL 500 MG PO TABS: 500.0000 mg | ORAL_TABLET | Freq: Three times a day (TID) | ORAL | 0 refills | Status: DC

## 2019-07-01 MED ORDER — IBUPROFEN 800 MG PO TABS: 800.0000 mg | ORAL_TABLET | Freq: Three times a day (TID) | ORAL | 0 refills | Status: DC | PRN

## 2019-07-01 MED ORDER — METHOCARBAMOL 500 MG PO TABS
500.0000 mg | ORAL_TABLET | Freq: Once | ORAL | Status: AC
  Administered 2019-07-01: 500 mg via ORAL
  Filled 2019-07-01: qty 1

## 2019-07-01 NOTE — ED Triage Notes (Signed)
Pt states she has been having cramping in both legs since last night. Feels like her potassium may be low.

## 2019-07-01 NOTE — Discharge Instructions (Signed)
Apply ice packs on and off to your back into your foot.  Try to wear supportive shoes.  Follow-up with your primary doctor for recheck if needed.
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
## 2019-07-01 NOTE — ED Triage Notes (Signed)
Pt C/O lower back and left foot pain.

## 2019-07-01 NOTE — Discharge Instructions (Addendum)
Follow up if not improving

## 2019-07-03 ENCOUNTER — Encounter (HOSPITAL_COMMUNITY): Payer: Self-pay

## 2019-07-03 ENCOUNTER — Ambulatory Visit (HOSPITAL_COMMUNITY): Admit: 2019-07-03 | Payer: Medicaid Other | Admitting: Internal Medicine

## 2019-07-03 SURGERY — COLONOSCOPY WITH PROPOFOL
Anesthesia: Monitor Anesthesia Care

## 2019-07-08 ENCOUNTER — Other Ambulatory Visit: Payer: Self-pay

## 2019-07-08 ENCOUNTER — Encounter (HOSPITAL_COMMUNITY): Payer: Self-pay | Admitting: Emergency Medicine

## 2019-07-08 ENCOUNTER — Emergency Department (HOSPITAL_COMMUNITY)
Admission: EM | Admit: 2019-07-08 | Discharge: 2019-07-08 | Disposition: A | Discharge status: Home / Self Care | Payer: Medicaid Other | Attending: Emergency Medicine | Admitting: Emergency Medicine

## 2019-07-08 DIAGNOSIS — R1030 Lower abdominal pain, unspecified: Secondary | ICD-10-CM | POA: Insufficient documentation

## 2019-07-08 DIAGNOSIS — M5441 Lumbago with sciatica, right side: Secondary | ICD-10-CM | POA: Diagnosis not present

## 2019-07-08 DIAGNOSIS — R102 Pelvic and perineal pain: Secondary | ICD-10-CM

## 2019-07-08 DIAGNOSIS — R200 Anesthesia of skin: Secondary | ICD-10-CM | POA: Insufficient documentation

## 2019-07-08 LAB — CBC
HCT: 41.7 % (ref 36.0–46.0)
Hemoglobin: 13 g/dL (ref 12.0–15.0)
MCH: 26.9 pg (ref 26.0–34.0)
MCHC: 31.2 g/dL (ref 30.0–36.0)
MCV: 86.2 fL (ref 80.0–100.0)
Platelets: 320 10*3/uL (ref 150–400)
RBC: 4.84 MIL/uL (ref 3.87–5.11)
RDW: 13.2 % (ref 11.5–15.5)
WBC: 7.3 10*3/uL (ref 4.0–10.5)
nRBC: 0 % (ref 0.0–0.2)

## 2019-07-08 LAB — COMPREHENSIVE METABOLIC PANEL
ALT: 25 U/L (ref 0–44)
AST: 25 U/L (ref 15–41)
Albumin: 4.5 g/dL (ref 3.5–5.0)
Alkaline Phosphatase: 48 U/L (ref 38–126)
Anion gap: 7 (ref 5–15)
BUN: 13 mg/dL (ref 6–20)
CO2: 24 mmol/L (ref 22–32)
Calcium: 9.5 mg/dL (ref 8.9–10.3)
Chloride: 108 mmol/L (ref 98–111)
Creatinine, Ser: 0.68 mg/dL (ref 0.44–1.00)
GFR calc Af Amer: >60 mL/min (ref >60)
GFR calc non Af Amer: >60 mL/min (ref >60)
Glucose, Bld: 90 mg/dL (ref 70–99)
Potassium: 4.1 mmol/L (ref 3.5–5.1)
Sodium: 139 mmol/L (ref 135–145)
Total Bilirubin: 0.9 mg/dL (ref 0.3–1.2)
Total Protein: 7.8 g/dL (ref 6.5–8.1)

## 2019-07-08 LAB — URINALYSIS, ROUTINE W REFLEX MICROSCOPIC
Bilirubin Urine: NEGATIVE
Glucose, UA: NEGATIVE mg/dL
Hgb urine dipstick: NEGATIVE
Ketones, ur: 20 mg/dL — AB
Leukocytes,Ua: NEGATIVE
Nitrite: NEGATIVE
Protein, ur: NEGATIVE mg/dL
Specific Gravity, Urine: 1.028 (ref 1.005–1.030)
pH: 6 (ref 5.0–8.0)

## 2019-07-08 LAB — LIPASE, BLOOD: Lipase: 29 U/L (ref 11–51)

## 2019-07-08 LAB — WET PREP, GENITAL
Clue Cells Wet Prep HPF POC: NONE SEEN
Sperm: NONE SEEN
Trich, Wet Prep: NONE SEEN
Yeast Wet Prep HPF POC: NONE SEEN

## 2019-07-08 LAB — RAPID HIV SCREEN (HIV 1/2 AB+AG)
HIV 1/2 Antibodies: NONREACTIVE
HIV-1 P24 Antigen - HIV24: NONREACTIVE

## 2019-07-08 LAB — POC URINE PREG, ED: Preg Test, Ur: NEGATIVE

## 2019-07-08 MED ORDER — ACETAMINOPHEN 325 MG PO TABS
650.0000 mg | ORAL_TABLET | Freq: Once | ORAL | Status: AC
  Administered 2019-07-08: 650 mg via ORAL
  Filled 2019-07-08: qty 2

## 2019-07-08 NOTE — ED Triage Notes (Signed)
Patient c/o lower abd pain that started today. Denies any fevers, nausea, vomiting, or urinary symptoms. Per patient last normal BM yesterday, no blood noted. Patient also c/o numbness in right leg and hands bilaterally that started shortly after abd pain. CNS intact. No neurological deficits noted.
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
## 2019-07-08 NOTE — Discharge Instructions (Addendum)
As discussed, all of your labs and tests today were normal. I want you to come back tomorrow for an ultrasound. Call tomorrow morning to schedule the ultrasound. If the ultrasound is normal, follow-up with your GI doctor for further evaluation. I have included the number for the orthopedic doctor for your low back pain. Call Monday to schedule an appointment for further evaluation. You may take over the counter tylenol as needed for your abdominal pain. Return to the ER for worsening pain that is no controlled at home or any new symptoms.

## 2019-07-09 ENCOUNTER — Ambulatory Visit (HOSPITAL_COMMUNITY)
Admission: RE | Admit: 2019-07-09 | Discharge: 2019-07-09 | Disposition: A | Discharge status: Home / Self Care | Payer: Medicaid Other | Source: Ambulatory Visit | Attending: Emergency Medicine | Admitting: Emergency Medicine

## 2019-07-09 DIAGNOSIS — R102 Pelvic and perineal pain: Secondary | ICD-10-CM | POA: Insufficient documentation

## 2019-07-09 LAB — RPR: RPR Ser Ql: NONREACTIVE

## 2019-07-09 NOTE — ED Provider Notes (Signed)
   Patient with lower abdominal/suprapubic pain seen in the ER yesterday.  History of ectopic pregnancy, returned this morning for scheduled outpatient pelvic ultrasound.  Pregnancy test from yesterday was negative.  Discussed ultrasound findings with patient.  She agrees to [facility].     US PELVIC COMPLETE WITH TRANSVAGINAL  Result Date: 07/09/2019 CLINICAL DATA:  Pelvic pressure for approximately 1 month. Patient had an abortion performed 1 month ago. EXAM: TRANSABDOMINAL AND TRANSVAGINAL ULTRASOUND OF PELVIS TECHNIQUE: Both transabdominal and transvaginal ultrasound examinations of the pelvis were performed. Transabdominal technique was performed for global imaging of the pelvis including uterus, ovaries, adnexal regions, and pelvic cul-de-sac. It was necessary to proceed with endovaginal exam following the transabdominal exam to visualize the uterus, endometrium and ovaries to better advantage. COMPARISON:  None FINDINGS: Uterus Measurements: 8.0 x 3.6 x 5.7 = volume: 87 mL. No fibroids or other mass visualized. Endometrium Thickness: 8 mm. No focal abnormality visualized. No endometrial fluid. No evidence of retained products of conception Right ovary Measurements: 4.0 x 2.3 x 3.7 cm = volume: 18 mL. Dominant simple appearing follicular cyst measuring 2.6 cm. Ovary otherwise unremarkable. No adnexal masses. Left ovary Left ovary not visualized.  No adnexal masses. Other findings No pelvic free fluid. IMPRESSION: 1. Normal exam.  Left ovary not visualized. Electronically Signed   By: Lajean Manes M.D.   On: 07/09/2019 11:23      Kem Parkinson, PA-C 07/09/19 1148    Milton Ferguson, MD 07/11/19 1028

## 2019-07-11 LAB — GC/CHLAMYDIA PROBE AMP (~~LOC~~) NOT AT ARMC
Chlamydia: NEGATIVE
Neisseria Gonorrhea: NEGATIVE

## 2019-07-13 ENCOUNTER — Other Ambulatory Visit: Payer: Self-pay

## 2019-07-13 ENCOUNTER — Ambulatory Visit (HOSPITAL_COMMUNITY)
Admission: RE | Admit: 2019-07-13 | Discharge: 2019-07-13 | Disposition: A | Discharge status: Home / Self Care | Payer: Medicaid Other | Source: Ambulatory Visit | Attending: *Deleted | Admitting: *Deleted

## 2019-07-13 DIAGNOSIS — D492 Neoplasm of unspecified behavior of bone, soft tissue, and skin: Secondary | ICD-10-CM | POA: Insufficient documentation

## 2019-07-13 MED ORDER — IOHEXOL 300 MG/ML  SOLN
75.0000 mL | Freq: Once | INTRAMUSCULAR | Status: AC | PRN
  Administered 2019-07-13: 75 mL via INTRAVENOUS

## 2019-07-18 ENCOUNTER — Other Ambulatory Visit: Payer: Self-pay

## 2019-07-18 ENCOUNTER — Ambulatory Visit: Payer: Medicaid Other | Attending: Internal Medicine

## 2019-07-18 DIAGNOSIS — Z20822 Contact with and (suspected) exposure to covid-19: Secondary | ICD-10-CM

## 2019-07-19 LAB — NOVEL CORONAVIRUS, NAA: SARS-CoV-2, NAA: NOT DETECTED

## 2019-07-20 ENCOUNTER — Telehealth: Payer: Self-pay

## 2019-07-20 NOTE — Telephone Encounter (Signed)
Caller given negative result and verbalized understanding  

## 2019-07-22 ENCOUNTER — Emergency Department (HOSPITAL_COMMUNITY)
Admission: EM | Admit: 2019-07-22 | Discharge: 2019-07-22 | Disposition: A | Discharge status: Home / Self Care | Payer: Medicaid Other | Attending: Emergency Medicine | Admitting: Emergency Medicine

## 2019-07-22 ENCOUNTER — Other Ambulatory Visit: Payer: Self-pay

## 2019-07-22 ENCOUNTER — Encounter (HOSPITAL_COMMUNITY): Payer: Self-pay | Admitting: Emergency Medicine

## 2019-07-22 DIAGNOSIS — R202 Paresthesia of skin: Secondary | ICD-10-CM | POA: Diagnosis not present

## 2019-07-22 DIAGNOSIS — R200 Anesthesia of skin: Secondary | ICD-10-CM | POA: Diagnosis present

## 2019-07-22 NOTE — ED Triage Notes (Signed)
Patient complains of right leg pain that started last night. States pain is aching numbness. States no exacerbating or alleviating factors.
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
## 2019-07-22 NOTE — Discharge Instructions (Addendum)
Below are the instruction for the ultrasound: IMPORTANT PATIENT INSTRUCTIONS:  Your ED provider has recommended an Outpatient Ultrasound.  Please call [phone] to schedule an appointment.  If your appointment is scheduled for a Saturday, Sunday or holiday, please go to the [facility] Emergency Department Registration Desk at least 15 minutes prior to your appointment time and tell them you are there for an ultrasound.    If your appointment is scheduled for a weekday (Monday-Friday), please go directly to the [facility] Radiology Department at least 15 minutes prior to your appointment time and tell them you are there for an ultrasound.  Please call [phone] with questions.  You may take over the counter Tylenol if pain develops. I recommend doing leg stretches daily. You can google different leg stretches that can help. If you notice any swelling, continue to elevate your leg. Report to your appointment with your PCP in a few days for further evaluation. Return to the ER for new or worsening symptoms.

## 2019-07-23 ENCOUNTER — Ambulatory Visit (HOSPITAL_COMMUNITY)
Admission: RE | Admit: 2019-07-23 | Discharge: 2019-07-23 | Disposition: A | Discharge status: Home / Self Care | Payer: Medicaid Other | Source: Ambulatory Visit | Attending: Student | Admitting: Student

## 2019-07-23 DIAGNOSIS — M79661 Pain in right lower leg: Secondary | ICD-10-CM | POA: Insufficient documentation
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
## 2019-07-31 ENCOUNTER — Telehealth: Payer: Self-pay

## 2019-07-31 ENCOUNTER — Ambulatory Visit (INDEPENDENT_AMBULATORY_CARE_PROVIDER_SITE_OTHER): Payer: Medicaid Other | Admitting: Gastroenterology

## 2019-07-31 ENCOUNTER — Other Ambulatory Visit: Payer: Self-pay

## 2019-07-31 ENCOUNTER — Encounter: Payer: Self-pay | Admitting: Gastroenterology

## 2019-07-31 VITALS — BP 118/76 | HR 65 | Temp 97.6°F | Ht 66.0 in | Wt 182.8 lb

## 2019-07-31 DIAGNOSIS — R1310 Dysphagia, unspecified: Secondary | ICD-10-CM

## 2019-07-31 DIAGNOSIS — R1013 Epigastric pain: Secondary | ICD-10-CM

## 2019-07-31 DIAGNOSIS — R6881 Early satiety: Secondary | ICD-10-CM

## 2019-07-31 DIAGNOSIS — K625 Hemorrhage of anus and rectum: Secondary | ICD-10-CM

## 2019-07-31 DIAGNOSIS — R634 Abnormal weight loss: Secondary | ICD-10-CM

## 2019-07-31 DIAGNOSIS — K219 Gastro-esophageal reflux disease without esophagitis: Secondary | ICD-10-CM | POA: Diagnosis not present

## 2019-07-31 DIAGNOSIS — K5900 Constipation, unspecified: Secondary | ICD-10-CM

## 2019-07-31 MED ORDER — PANTOPRAZOLE SODIUM 40 MG PO TBEC: 40.0000 mg | DELAYED_RELEASE_TABLET | Freq: Every day | ORAL | 5 refills | Status: DC
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
## 2019-07-31 NOTE — Assessment & Plan Note (Signed)
Daily solid food dysphagia x 4-6 months with sensation of food getting hung around the sternal notch.  Denies dysphagia to soft foods or liquids.  Also reports globus sensation.  She was having daily GERD symptoms at her last visit in November 2020 and was started on Protonix 40 mg daily.  After 1 month of Protonix, GERD symptoms resolved and patient discontinued Protonix.  Of note, she is also having daily intermittent epigastric pain that was improving on Protonix.  History of NSAID use a few months ago prior to onset of epigastric pain, but none since.  Suspect patient likely has esophageal web, ring, stricture that was originally secondary to uncontrolled GERD.  Cannot rule out malignancy although less likely.  Proceed with EGD with possible esophageal dilation with propofol with Dr. Gala Romney in the near future. The risks, benefits, and alternatives have been discussed in detail with patient. They have stated understanding and desire to proceed.  Resume Protonix 40 mg daily 30 minutes before breakfast due to epigastric pain as discussed above. Follow-up after procedure.

## 2019-07-31 NOTE — Telephone Encounter (Signed)
COVID test scheduled for 08/08/19 at 12:00pm, pre-op at 12:45pm. Tried to call pt, no answer, unable to leave VM d/t mailbox is full. MyChart message sent to inform pt of appts. Letter also mailed. Procedure instructions were given to pt at OV.

## 2019-07-31 NOTE — Assessment & Plan Note (Signed)
Addressed under early satiety. 
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
## 2019-07-31 NOTE — Patient Instructions (Addendum)
We will get you scheduled for an upper endoscopy with possible dilation of your esophagus with Dr. Gala Romney in the near future.  Resume taking Protonix 40 mg daily 30 minutes before breakfast. I am sending in a new prescription.   Be sure you are eating 4-6 small meals daily.  Follow a low-fat diet.  Avoid fried, fatty, greasy, spicy foods.  Avoid carbonated beverages.  Do not eat within 3 hours of laying down.  Continue to avoid all NSAIDs including ibuprofen, Aleve, Advil, and Goody powders.  For constipation: Start taking MiraLAX 1 capful daily in 8 ounces of water.  You may also use the generic ClearLax.  You should also drink enough water to keep your urine pale yellow to clear.   Monitor for any recurrence of rectal bleeding.  We will plan to follow-up with you in the office after your procedures.  Call if you have questions or concerns prior.  Aliene Altes, PA-C [facility]   Constipation, Adult Constipation is when a person:  Poops (has a bowel movement) fewer times in a week than normal.  Has a hard time pooping.  Has poop that is dry, hard, or bigger than normal. Follow these instructions at home: Eating and drinking   Eat foods that have a lot of fiber, such as: ? Fresh fruits and vegetables. ? Whole grains. ? Beans.  Eat less of foods that are high in fat, low in fiber, or overly processed, such as: ? [place] fries. ? Hamburgers. ? Cookies. ? Candy. ? Soda.  Drink enough fluid to keep your pee (urine) clear or pale yellow. General instructions  Exercise regularly or as told by your doctor.  Go to the restroom when you feel like you need to poop. Do not hold it in.  Take over-the-counter and prescription medicines only as told by your doctor. These include any fiber supplements.  Do pelvic floor retraining exercises, such as: ? Doing deep breathing while relaxing your lower belly (abdomen). ? Relaxing your pelvic floor while  pooping.  Watch your condition for any changes.  Keep all follow-up visits as told by your doctor. This is important. Contact a doctor if:  You have pain that gets worse.  You have a fever.  You have not pooped for 4 days.  You throw up (vomit).  You are not hungry.  You lose weight.  You are bleeding from the anus.  You have thin, pencil-like poop (stool). Get help right away if:  You have a fever, and your symptoms suddenly get worse.  You leak poop or have blood in your poop.  Your belly feels hard or bigger than normal (is bloated).  You have very bad belly pain.  You feel dizzy or you faint. This information is not intended to replace advice given to you by your health care provider. Make sure you discuss any questions you have with your health care provider. Document Revised: 06/25/2017 Document Reviewed: 01/01/2016 Elsevier Patient Education  2020 [employer].

## 2019-07-31 NOTE — Assessment & Plan Note (Addendum)
Currently having BMs daily but stools are hard with straining at times.  One episode of bright red blood per rectum in October 2020 in the setting of acute diarrhea.  No further rectal bleeding.  No significant lower abdominal pain.  She has other upper GI symptoms including epigastric pain, early satiety, and dysphagia as discussed above.  She was scheduled for colonoscopy due to rectal bleeding after her last visit but this was postponed due to health issues as discussed in HPI.   At this time, will hold off on rescheduling TCS as rectal bleeding was an isolated event and I feel upper GI tract needs to be evaluated first.   Most recent hemoglobin in December 2020 normal at 13.0.  We will plan to treat constipation with MiraLAX 1 capful daily.  Advised that she drink enough water to keep urine pale yellow to clear.  She is to continue to monitor for any recurrence of rectal bleeding.  Would have low threshold for pursuing TCS in the future.

## 2019-07-31 NOTE — Assessment & Plan Note (Addendum)
Patient had one episode of bright red blood per rectum in the setting of acute diarrhea in October 2020.  Reported blood in the stool and on toilet tissue.  Diarrhea has resolved and she has had no further episodes of rectal bleeding.  BMs are daily but stools are hard with associated straining at times.  Denies lower abdominal pain.  According to our scales, 13 pound weight loss since we saw her last.  She does have upper GI symptoms including epigastric pain, dysphagia, and chronic early satiety as discussed above.  She denies change in appetite or change in oral intake.  No family history of colon cancer.  Most recent hemoglobin in December normal at 13.0. Of note, patient was found to be pregnant and had pharmacologic termination of pregnancy since we saw her last.  Query whether this played a role in weight loss.  Patient had initially been scheduled for colonoscopy/EGD +/- dilation at her last visit in November but procedures were canceled due to other health issues as detailed in HPI.  Will hold off on rescheduling TCS at this time as rectal bleeding was an isolated event and I feel upper GI tract needs to be evaluated first. Patient advised to continue to monitor for bright red blood per rectum. Would have low threshold for pursuing TCS in the future.

## 2019-08-07 NOTE — Patient Instructions (Addendum)
Ruth Dunlap  08/07/2019     @PREFPERIOPPHARMACY @   Your procedure is scheduled on 08/10/2019.  Report to [name] at 9;00 A.M.  Call this number if you have problems the morning of surgery:  [phone]   Remember:  Do not eat or drink after midnight.  Please follow prep instructions on sheet from Dr Roseanne Kaufman office.    Take these medicines the morning of surgery with A SIP OF WATER : protonix    Do not wear jewelry, make-up or nail polish.  Do not wear lotions, powders, or perfumes, or deodorant.  Do not shave 48 hours prior to surgery.  Men may shave face and neck.  Do not bring valuables to the hospital.  [facility] is not responsible for any belongings or valuables.  Contacts, dentures or bridgework may not be worn into surgery.  Leave your suitcase in the car.  After surgery it may be brought to your room.  For patients admitted to the hospital, discharge time will be determined by your treatment team.  Patients discharged the day of surgery will not be allowed to drive home.   Name and phone number of your driver:   family Special instructions:  n/a  Please read over the following fact sheets that you were given. Care and Recovery After Surgery    Upper Endoscopy, Adult Upper endoscopy is a procedure to look inside the upper GI (gastrointestinal) tract. The upper GI tract is made up of:  The part of the body that moves food from your mouth to your stomach (esophagus).  The stomach.  The first part of your small intestine (duodenum). This procedure is also called esophagogastroduodenoscopy (EGD) or gastroscopy. In this procedure, your health care provider passes a thin, flexible tube (endoscope) through your mouth and down your esophagus into your stomach. A small camera is attached to the end of the tube. Images from the camera appear on a monitor in the exam room. During this procedure, your health care provider may also remove a small piece of tissue to be  sent to a lab and examined under a microscope (biopsy). Your health care provider may do an upper endoscopy to diagnose cancers of the upper GI tract. You may also have this procedure to find the cause of other conditions, such as:  Stomach pain.  Heartburn.  Pain or problems when swallowing.  Nausea and vomiting.  Stomach bleeding.  Stomach ulcers. Tell a health care provider about:  Any allergies you have.  All medicines you are taking, including vitamins, herbs, eye drops, creams, and over-the-counter medicines.  Any problems you or family members have had with anesthetic medicines.  Any blood disorders you have.  Any surgeries you have had.  Any medical conditions you have.  Whether you are pregnant or may be pregnant. What are the risks? Generally, this is a safe procedure. However, problems may occur, including:  Infection.  Bleeding.  Allergic reactions to medicines.  A tear or hole (perforation) in the esophagus, stomach, or duodenum. What happens before the procedure? Staying hydrated Follow instructions from your health care provider about hydration, which may include:  Up to 2 hours before the procedure - you may continue to drink clear liquids, such as water, clear fruit juice, black coffee, and plain tea.  Eating and drinking restrictions Follow instructions from your health care provider about eating and drinking, which may include:  8 hours before the procedure - stop eating heavy meals or foods, such as  meat, fried foods, or fatty foods.  6 hours before the procedure - stop eating light meals or foods, such as toast or cereal.  6 hours before the procedure - stop drinking milk or drinks that contain milk.  2 hours before the procedure - stop drinking clear liquids. Medicines Ask your health care provider about:  Changing or stopping your regular medicines. This is especially important if you are taking diabetes medicines or blood  thinners.  Taking medicines such as aspirin and ibuprofen. These medicines can thin your blood. Do not take these medicines unless your health care provider tells you to take them.  Taking over-the-counter medicines, vitamins, herbs, and supplements. General instructions  Plan to have someone take you home from the hospital or clinic.  If you will be going home right after the procedure, plan to have someone with you for 24 hours.  Ask your health care provider what steps will be taken to help prevent infection. What happens during the procedure?   An IV will be inserted into one of your veins.  You may be given one or more of the following: ? A medicine to help you relax (sedative). ? A medicine to numb the throat (local anesthetic).  You will lie on your left side on an exam table.  Your health care provider will pass the endoscope through your mouth and down your esophagus.  Your health care provider will use the scope to check the inside of your esophagus, stomach, and duodenum. Biopsies may be taken.  The endoscope will be removed. The procedure may vary among health care providers and hospitals. What happens after the procedure?  Your blood pressure, heart rate, breathing rate, and blood oxygen level will be monitored until you leave the hospital or clinic.  Do not drive for 24 hours if you were given a sedative during your procedure.  When your throat is no longer numb, you may be given some fluids to drink.  It is up to you to get the results of your procedure. Ask your health care provider, or the department that is doing the procedure, when your results will be ready. Summary  Upper endoscopy is a procedure to look inside the upper GI tract.  During the procedure, an IV will be inserted into one of your veins. You may be given a medicine to help you relax.  A medicine will be used to numb your throat.  The endoscope will be passed through your mouth and down  your esophagus. This information is not intended to replace advice given to you by your health care provider. Make sure you discuss any questions you have with your health care provider. Document Revised: 01/05/2018 Document Reviewed: 12/13/2017 Elsevier Patient Education  [place].

## 2019-08-08 ENCOUNTER — Other Ambulatory Visit (HOSPITAL_COMMUNITY)
Admission: RE | Admit: 2019-08-08 | Discharge: 2019-08-08 | Disposition: A | Discharge status: Home / Self Care | Payer: Medicaid Other | Source: Ambulatory Visit | Attending: Internal Medicine | Admitting: Internal Medicine

## 2019-08-08 ENCOUNTER — Other Ambulatory Visit: Payer: Self-pay

## 2019-08-08 ENCOUNTER — Encounter (HOSPITAL_COMMUNITY)
Admission: RE | Admit: 2019-08-08 | Discharge: 2019-08-08 | Disposition: A | Discharge status: Home / Self Care | Payer: Medicaid Other | Source: Ambulatory Visit | Attending: Internal Medicine | Admitting: Internal Medicine

## 2019-08-08 ENCOUNTER — Telehealth: Payer: Self-pay | Admitting: *Deleted

## 2019-08-08 ENCOUNTER — Encounter (HOSPITAL_COMMUNITY): Payer: Self-pay

## 2019-08-08 DIAGNOSIS — Z01812 Encounter for preprocedural laboratory examination: Secondary | ICD-10-CM | POA: Diagnosis present

## 2019-08-08 DIAGNOSIS — Z20822 Contact with and (suspected) exposure to covid-19: Secondary | ICD-10-CM | POA: Diagnosis not present

## 2019-08-08 LAB — SARS CORONAVIRUS 2 (TAT 6-24 HRS): SARS Coronavirus 2: NEGATIVE

## 2019-08-08 NOTE — Telephone Encounter (Signed)
Received message from [place] in endo patient no showed for her PAT.   Called pt, went straight to VM, unable to leave a VM.  MB sent a MYCHART message on 07/31/2019 advising of pre-op/covid testing appt and it show receipt message read by patient. Another [facility] message has been sent.

## 2019-08-10 ENCOUNTER — Encounter (HOSPITAL_COMMUNITY): Admission: RE | Payer: Self-pay | Source: Home / Self Care

## 2019-08-10 ENCOUNTER — Encounter (HOSPITAL_COMMUNITY): Payer: Self-pay | Admitting: Anesthesiology

## 2019-08-10 SURGERY — ESOPHAGOGASTRODUODENOSCOPY (EGD) WITH PROPOFOL
Anesthesia: Monitor Anesthesia Care

## 2019-08-13 ENCOUNTER — Other Ambulatory Visit: Payer: Self-pay

## 2019-08-13 ENCOUNTER — Encounter (HOSPITAL_COMMUNITY): Payer: Self-pay | Admitting: Emergency Medicine

## 2019-08-13 ENCOUNTER — Emergency Department (HOSPITAL_COMMUNITY)
Admission: EM | Admit: 2019-08-13 | Discharge: 2019-08-13 | Disposition: A | Discharge status: Home / Self Care | Payer: Medicaid Other | Attending: Emergency Medicine | Admitting: Emergency Medicine

## 2019-08-13 ENCOUNTER — Emergency Department (HOSPITAL_COMMUNITY): Payer: Medicaid Other

## 2019-08-13 DIAGNOSIS — R0789 Other chest pain: Secondary | ICD-10-CM | POA: Diagnosis present

## 2019-08-13 DIAGNOSIS — I471 Supraventricular tachycardia: Secondary | ICD-10-CM | POA: Diagnosis not present

## 2019-08-13 LAB — COMPREHENSIVE METABOLIC PANEL
ALT: 26 U/L (ref 0–44)
AST: 23 U/L (ref 15–41)
Albumin: 4.1 g/dL (ref 3.5–5.0)
Alkaline Phosphatase: 52 U/L (ref 38–126)
Anion gap: 11 (ref 5–15)
BUN: 12 mg/dL (ref 6–20)
CO2: 21 mmol/L — ABNORMAL LOW (ref 22–32)
Calcium: 9.4 mg/dL (ref 8.9–10.3)
Chloride: 104 mmol/L (ref 98–111)
Creatinine, Ser: 0.75 mg/dL (ref 0.44–1.00)
GFR calc Af Amer: >60 mL/min (ref >60)
GFR calc non Af Amer: >60 mL/min (ref >60)
Glucose, Bld: 110 mg/dL — ABNORMAL HIGH (ref 70–99)
Potassium: 3.4 mmol/L — ABNORMAL LOW (ref 3.5–5.1)
Sodium: 136 mmol/L (ref 135–145)
Total Bilirubin: 0.6 mg/dL (ref 0.3–1.2)
Total Protein: 7.3 g/dL (ref 6.5–8.1)

## 2019-08-13 LAB — CBC WITH DIFFERENTIAL/PLATELET
Abs Immature Granulocytes: 0.03 10*3/uL (ref 0.00–0.07)
Basophils Absolute: 0 10*3/uL (ref 0.0–0.1)
Basophils Relative: 0 %
Eosinophils Absolute: 0.1 10*3/uL (ref 0.0–0.5)
Eosinophils Relative: 1 %
HCT: 40.9 % (ref 36.0–46.0)
Hemoglobin: 13.5 g/dL (ref 12.0–15.0)
Immature Granulocytes: 0 %
Lymphocytes Relative: 40 %
Lymphs Abs: 3.9 10*3/uL (ref 0.7–4.0)
MCH: 27.6 pg (ref 26.0–34.0)
MCHC: 33 g/dL (ref 30.0–36.0)
MCV: 83.5 fL (ref 80.0–100.0)
Monocytes Absolute: 0.9 10*3/uL (ref 0.1–1.0)
Monocytes Relative: 9 %
Neutro Abs: 4.8 10*3/uL (ref 1.7–7.7)
Neutrophils Relative %: 50 %
Platelets: 291 10*3/uL (ref 150–400)
RBC: 4.9 MIL/uL (ref 3.87–5.11)
RDW: 13.1 % (ref 11.5–15.5)
WBC: 9.7 10*3/uL (ref 4.0–10.5)
nRBC: 0 % (ref 0.0–0.2)

## 2019-08-13 LAB — TROPONIN I (HIGH SENSITIVITY): Troponin I (High Sensitivity): <2 ng/L (ref <18)

## 2019-08-13 LAB — I-STAT BETA HCG BLOOD, ED (MC, WL, AP ONLY): I-stat hCG, quantitative: <5 m[IU]/mL (ref <5)

## 2019-08-13 MED ORDER — LIDOCAINE VISCOUS HCL 2 % MT SOLN
15.0000 mL | Freq: Once | OROMUCOSAL | Status: DC
  Filled 2019-08-13: qty 15

## 2019-08-13 MED ORDER — MORPHINE SULFATE (PF) 2 MG/ML IV SOLN
2.0000 mg | Freq: Once | INTRAVENOUS | Status: AC
  Administered 2019-08-13: 09:00:00 2 mg via INTRAVENOUS
  Filled 2019-08-13: qty 1

## 2019-08-13 MED ORDER — METOPROLOL TARTRATE 5 MG/5ML IV SOLN
5.0000 mg | Freq: Once | INTRAVENOUS | Status: AC
  Administered 2019-08-13: 5 mg via INTRAVENOUS

## 2019-08-13 MED ORDER — ALUM & MAG HYDROXIDE-SIMETH 200-200-20 MG/5ML PO SUSP
30.0000 mL | Freq: Once | ORAL | Status: DC
  Filled 2019-08-13: qty 30

## 2019-08-13 MED ORDER — ADENOSINE 6 MG/2ML IV SOLN
INTRAVENOUS | Status: AC
  Filled 2019-08-13: qty 2

## 2019-08-13 MED ORDER — ADENOSINE 6 MG/2ML IV SOLN
12.0000 mg | Freq: Once | INTRAVENOUS | Status: AC
  Administered 2019-08-13: 12 mg via INTRAVENOUS

## 2019-08-13 MED ORDER — METOPROLOL TARTRATE 25 MG PO TABS: 25.0000 mg | ORAL_TABLET | Freq: Once | ORAL | 0 refills | Status: DC

## 2019-08-13 MED ORDER — ADENOSINE 6 MG/2ML IV SOLN
INTRAVENOUS | Status: AC
  Filled 2019-08-13: qty 6

## 2019-08-13 MED ORDER — METOPROLOL TARTRATE 5 MG/5ML IV SOLN
INTRAVENOUS | Status: AC
  Filled 2019-08-13: qty 5

## 2019-08-13 NOTE — ED Triage Notes (Signed)
C/o chest pain for last 10 minutes.  Pt is anxious.  Rates apin 10/10, tightness.
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
## 2019-08-13 NOTE — Discharge Instructions (Addendum)
Follow-up with the cardiologist you are referred to and return here if any problems

## 2019-08-13 NOTE — ED Notes (Signed)
Adenosine12 mg given, pt is SVT

## 2019-08-14 ENCOUNTER — Ambulatory Visit (HOSPITAL_COMMUNITY): Admission: RE | Admit: 2019-08-14 | Payer: Medicaid Other | Source: Home / Self Care | Admitting: Internal Medicine

## 2019-08-29 ENCOUNTER — Ambulatory Visit: Payer: Medicaid Other | Admitting: Cardiology

## 2019-08-31 ENCOUNTER — Other Ambulatory Visit: Payer: Self-pay

## 2019-08-31 ENCOUNTER — Encounter (HOSPITAL_COMMUNITY): Payer: Self-pay | Admitting: *Deleted

## 2019-08-31 ENCOUNTER — Emergency Department (HOSPITAL_COMMUNITY)
Admission: EM | Admit: 2019-08-31 | Discharge: 2019-08-31 | Disposition: A | Discharge status: Home / Self Care | Payer: Medicaid Other | Attending: Emergency Medicine | Admitting: Emergency Medicine

## 2019-08-31 DIAGNOSIS — R002 Palpitations: Secondary | ICD-10-CM | POA: Diagnosis present

## 2019-08-31 DIAGNOSIS — Z79899 Other long term (current) drug therapy: Secondary | ICD-10-CM | POA: Diagnosis not present

## 2019-08-31 LAB — BASIC METABOLIC PANEL
Anion gap: 8 (ref 5–15)
BUN: 14 mg/dL (ref 6–20)
CO2: 24 mmol/L (ref 22–32)
Calcium: 9.3 mg/dL (ref 8.9–10.3)
Chloride: 105 mmol/L (ref 98–111)
Creatinine, Ser: 0.77 mg/dL (ref 0.44–1.00)
GFR calc Af Amer: >60 mL/min (ref >60)
GFR calc non Af Amer: >60 mL/min (ref >60)
Glucose, Bld: 131 mg/dL — ABNORMAL HIGH (ref 70–99)
Potassium: 3.9 mmol/L (ref 3.5–5.1)
Sodium: 137 mmol/L (ref 135–145)

## 2019-08-31 LAB — CBC
HCT: 39.6 % (ref 36.0–46.0)
Hemoglobin: 12.7 g/dL (ref 12.0–15.0)
MCH: 27.2 pg (ref 26.0–34.0)
MCHC: 32.1 g/dL (ref 30.0–36.0)
MCV: 84.8 fL (ref 80.0–100.0)
Platelets: 316 10*3/uL (ref 150–400)
RBC: 4.67 MIL/uL (ref 3.87–5.11)
RDW: 12.9 % (ref 11.5–15.5)
WBC: 8.4 10*3/uL (ref 4.0–10.5)
nRBC: 0 % (ref 0.0–0.2)

## 2019-08-31 MED ORDER — METOPROLOL TARTRATE 25 MG PO TABS: 50.0000 mg | ORAL_TABLET | Freq: Two times a day (BID) | ORAL | 3 refills | Status: DC

## 2019-08-31 MED ORDER — METOPROLOL TARTRATE 25 MG PO TABS
25.0000 mg | ORAL_TABLET | Freq: Once | ORAL | Status: AC
  Administered 2019-08-31: 25 mg via ORAL
  Filled 2019-08-31: qty 1
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
## 2019-08-31 NOTE — ED Triage Notes (Signed)
Pt states she was lying down and suddenly her heart started beating fast; pt states she is also c/o abdominal pain and left arm numbness

## 2019-08-31 NOTE — ED Notes (Signed)
Pt c/o feeling flushed and requesting to see nurse. This nurse went to check on pt, pt HR was 168 on monitor. EKG obtained, MD made aware and orders placed.

## 2019-09-01 ENCOUNTER — Ambulatory Visit (INDEPENDENT_AMBULATORY_CARE_PROVIDER_SITE_OTHER): Payer: Medicaid Other | Admitting: Cardiology

## 2019-09-01 ENCOUNTER — Telehealth: Payer: Self-pay | Admitting: Cardiology

## 2019-09-01 ENCOUNTER — Encounter: Payer: Self-pay | Admitting: Cardiology

## 2019-09-01 VITALS — BP 119/79 | HR 84 | Ht 66.0 in | Wt 179.2 lb

## 2019-09-01 DIAGNOSIS — R002 Palpitations: Secondary | ICD-10-CM | POA: Diagnosis not present

## 2019-09-01 DIAGNOSIS — R0789 Other chest pain: Secondary | ICD-10-CM

## 2019-09-01 MED ORDER — METOPROLOL TARTRATE 25 MG PO TABS: 25.0000 mg | ORAL_TABLET | Freq: Two times a day (BID) | ORAL | 3 refills | Status: DC
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
## 2019-09-01 NOTE — Patient Instructions (Signed)
Your physician recommends that you schedule a follow-up appointment in: [place] has recommended you make the following change in your medication:   [place] has recommended that you wear an event monitor FOR 1 WEEK. Event monitors are medical devices that record the heart's electrical activity. Doctors most often [place] these monitors to diagnose arrhythmias. Arrhythmias are problems with the speed or rhythm of the heartbeat. The monitor is a small, portable device. You can wear one while you do your normal daily activities. This is usually used to diagnose what is causing palpitations/syncope (passing out).  Thank you for choosing [facility]!!

## 2019-09-01 NOTE — Telephone Encounter (Signed)
No precert required 

## 2019-09-01 NOTE — Telephone Encounter (Signed)
  Precert needed for:   1 WEEK EVENT MONITOR

## 2019-09-14 ENCOUNTER — Encounter (INDEPENDENT_AMBULATORY_CARE_PROVIDER_SITE_OTHER): Payer: Medicaid Other

## 2019-09-14 DIAGNOSIS — R002 Palpitations: Secondary | ICD-10-CM

## 2019-09-17 ENCOUNTER — Encounter (HOSPITAL_COMMUNITY): Payer: Self-pay | Admitting: Emergency Medicine

## 2019-09-17 ENCOUNTER — Other Ambulatory Visit: Payer: Self-pay

## 2019-09-17 ENCOUNTER — Emergency Department (HOSPITAL_COMMUNITY)
Admission: EM | Admit: 2019-09-17 | Discharge: 2019-09-17 | Disposition: A | Discharge status: Home / Self Care | Payer: Medicaid Other | Attending: Emergency Medicine | Admitting: Emergency Medicine

## 2019-09-17 DIAGNOSIS — Z79899 Other long term (current) drug therapy: Secondary | ICD-10-CM | POA: Diagnosis not present

## 2019-09-17 DIAGNOSIS — I7300 Raynaud's syndrome without gangrene: Secondary | ICD-10-CM | POA: Insufficient documentation

## 2019-09-17 DIAGNOSIS — R002 Palpitations: Secondary | ICD-10-CM

## 2019-09-17 DIAGNOSIS — R202 Paresthesia of skin: Secondary | ICD-10-CM | POA: Diagnosis not present

## 2019-09-17 DIAGNOSIS — I471 Supraventricular tachycardia: Secondary | ICD-10-CM | POA: Diagnosis not present

## 2019-09-17 DIAGNOSIS — R0789 Other chest pain: Secondary | ICD-10-CM | POA: Diagnosis present
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
## 2019-09-17 NOTE — ED Triage Notes (Signed)
Pt states she was lying down and that her heart felt like it was beating fast. States that "it looked like my thumb was turning a different color".

## 2019-09-17 NOTE — Discharge Instructions (Addendum)
Follow-up with your cardiologist as requested. Keep the event monitor on.  You had one episode where your heart rate was in the 140s in the ER, that episode lasted just for a few seconds.  Take the medications prescribed to prevent these episodes.  Additionally, the finger symptoms sound like Raynaud's phenomenon.  Please read that information and discuss it with your primary doctor.

## 2019-09-19 ENCOUNTER — Ambulatory Visit: Payer: Medicaid Other | Admitting: Cardiology

## 2019-09-29 ENCOUNTER — Emergency Department (HOSPITAL_COMMUNITY)
Admission: EM | Admit: 2019-09-29 | Discharge: 2019-09-29 | Disposition: A | Discharge status: Home / Self Care | Payer: Medicaid Other | Attending: Emergency Medicine | Admitting: Emergency Medicine

## 2019-09-29 ENCOUNTER — Telehealth: Payer: Self-pay | Admitting: Cardiology

## 2019-09-29 ENCOUNTER — Encounter (HOSPITAL_COMMUNITY): Payer: Self-pay

## 2019-09-29 ENCOUNTER — Other Ambulatory Visit: Payer: Self-pay

## 2019-09-29 DIAGNOSIS — Z79899 Other long term (current) drug therapy: Secondary | ICD-10-CM | POA: Diagnosis not present

## 2019-09-29 DIAGNOSIS — R519 Headache, unspecified: Secondary | ICD-10-CM | POA: Insufficient documentation

## 2019-09-29 MED ORDER — DIPHENHYDRAMINE HCL 25 MG PO CAPS
25.0000 mg | ORAL_CAPSULE | Freq: Once | ORAL | Status: AC
  Administered 2019-09-29: 25 mg via ORAL
  Filled 2019-09-29: qty 1

## 2019-09-29 MED ORDER — KETOROLAC TROMETHAMINE 60 MG/2ML IM SOLN
60.0000 mg | Freq: Once | INTRAMUSCULAR | Status: AC
  Administered 2019-09-29: 15:00:00 60 mg via INTRAMUSCULAR
  Filled 2019-09-29: qty 2

## 2019-09-29 MED ORDER — TRAMADOL HCL 50 MG PO TABS: 50.0000 mg | ORAL_TABLET | Freq: Four times a day (QID) | ORAL | 0 refills | Status: DC | PRN

## 2019-09-29 NOTE — Discharge Instructions (Addendum)
Follow-up with your primary provider for recheck

## 2019-09-29 NOTE — Telephone Encounter (Signed)
Patient called requesting results of her recent monitor.
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
## 2019-09-29 NOTE — ED Triage Notes (Signed)
Pt reports lightheadedness and HA since night. Had one episode of palpitations. Pt has seen heart dr and was placed on monitor and will get results back on 10th

## 2019-09-29 NOTE — Telephone Encounter (Signed)
Pt aware that we have not received EOS report from [facility] - once we received Dr Harl Bowie would review and would contact her

## 2019-10-04 ENCOUNTER — Telehealth (INDEPENDENT_AMBULATORY_CARE_PROVIDER_SITE_OTHER): Payer: Medicaid Other | Admitting: Cardiology

## 2019-10-04 ENCOUNTER — Encounter: Payer: Self-pay | Admitting: Cardiology

## 2019-10-04 VITALS — Ht 66.0 in | Wt 179.0 lb

## 2019-10-04 DIAGNOSIS — R002 Palpitations: Secondary | ICD-10-CM

## 2019-10-04 MED ORDER — DILTIAZEM HCL 30 MG PO TABS: 30.0000 mg | ORAL_TABLET | Freq: Two times a day (BID) | ORAL | 1 refills | Status: DC
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
## 2019-10-04 NOTE — Patient Instructions (Signed)
Your physician wants you to follow-up in: [place] will receive a reminder letter in the mail two months in advance. If you don't receive a letter, please call our office to schedule the follow-up appointment.  Your physician has recommended you make the following change in your medication:   STOP LOPRESSOR   START DILTIAZEM 30 MG TWICE DAILY   Thank you for choosing [facility]!!

## 2019-10-04 NOTE — Addendum Note (Signed)
Addended by: Julian Hy T on: 10/04/2019 03:40 PM   Modules accepted: Orders

## 2019-10-15 ENCOUNTER — Emergency Department (HOSPITAL_COMMUNITY)
Admission: EM | Admit: 2019-10-15 | Discharge: 2019-10-15 | Disposition: A | Discharge status: Home / Self Care | Payer: Medicaid Other | Attending: Emergency Medicine | Admitting: Emergency Medicine

## 2019-10-15 ENCOUNTER — Other Ambulatory Visit: Payer: Self-pay

## 2019-10-15 ENCOUNTER — Encounter (HOSPITAL_COMMUNITY): Payer: Self-pay | Admitting: Emergency Medicine

## 2019-10-15 ENCOUNTER — Emergency Department (HOSPITAL_COMMUNITY): Payer: Medicaid Other

## 2019-10-15 DIAGNOSIS — R079 Chest pain, unspecified: Secondary | ICD-10-CM

## 2019-10-15 DIAGNOSIS — F419 Anxiety disorder, unspecified: Secondary | ICD-10-CM | POA: Insufficient documentation

## 2019-10-15 DIAGNOSIS — R0789 Other chest pain: Secondary | ICD-10-CM | POA: Insufficient documentation

## 2019-10-15 DIAGNOSIS — Z79899 Other long term (current) drug therapy: Secondary | ICD-10-CM | POA: Diagnosis not present

## 2019-10-15 LAB — I-STAT CHEM 8, ED
BUN: 11 mg/dL (ref 6–20)
Calcium, Ion: 1.24 mmol/L (ref 1.15–1.40)
Chloride: 104 mmol/L (ref 98–111)
Creatinine, Ser: 0.8 mg/dL (ref 0.44–1.00)
Glucose, Bld: 92 mg/dL (ref 70–99)
HCT: 37 % (ref 36.0–46.0)
Hemoglobin: 12.6 g/dL (ref 12.0–15.0)
Potassium: 4 mmol/L (ref 3.5–5.1)
Sodium: 138 mmol/L (ref 135–145)
TCO2: 27 mmol/L (ref 22–32)
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
## 2019-10-15 NOTE — ED Triage Notes (Signed)
Patient c/o mid-sternal chest pain, non-radiating. Per patient lasted approx 3-5 minutes with some light headedness. Per patient still feels light headed. Hx of heart plpitations.

## 2019-10-15 NOTE — Discharge Instructions (Addendum)
Your ekg, chest xray and basic labs are stable tonight.  I recommend starting the diltiazem that was prescribed by your cardiologist to help control your symptoms.

## 2019-11-27 ENCOUNTER — Encounter (HOSPITAL_COMMUNITY): Payer: Self-pay | Admitting: Emergency Medicine

## 2019-11-27 ENCOUNTER — Other Ambulatory Visit: Payer: Self-pay

## 2019-11-27 ENCOUNTER — Emergency Department (HOSPITAL_COMMUNITY)
Admission: EM | Admit: 2019-11-27 | Discharge: 2019-11-27 | Disposition: A | Discharge status: Home / Self Care | Payer: Medicaid Other | Attending: Emergency Medicine | Admitting: Emergency Medicine

## 2019-11-27 DIAGNOSIS — K0889 Other specified disorders of teeth and supporting structures: Secondary | ICD-10-CM | POA: Diagnosis not present

## 2019-11-27 DIAGNOSIS — Z79899 Other long term (current) drug therapy: Secondary | ICD-10-CM | POA: Diagnosis not present

## 2019-11-27 DIAGNOSIS — H9202 Otalgia, left ear: Secondary | ICD-10-CM | POA: Diagnosis present

## 2019-11-27 MED ORDER — AMOXICILLIN-POT CLAVULANATE 875-125 MG PO TABS: 1.0000 | ORAL_TABLET | Freq: Two times a day (BID) | ORAL | 0 refills | Status: DC
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
## 2019-11-27 NOTE — Discharge Instructions (Signed)
You have been seen today for left ear and dental pain. Please read and follow all provided instructions. Return to the emergency room for worsening condition or new concerning symptoms.    1. Medications:  Prescription sent toy your pharmacy for Augmentin.  This is used to treat dental and ear infections.  Please take as prescribed.  Take with food so it does not cause an upset stomach.  -You can also take ibuprofen and Tylenol as needed for pain.  Take as written on the bottle.  Continue usual home medications Take medications as prescribed. Please review all of the medicines and only take them if you do not have an allergy to them.   2. Treatment: rest, drink plenty of fluids  3. Follow Up:  Please follow up with primary care provider by scheduling an appointment as soon as possible for a visit   -Also recommend you follow up with the dentist about your wisdom teeth   It is also a possibility that you have an allergic reaction to any of the medicines that you have been prescribed - Everybody reacts differently to medications and while MOST people have no trouble with most medicines, you may have a reaction such as nausea, vomiting, rash, swelling, shortness of breath. If this is the case, please stop taking the medicine immediately and contact your physician.  ?

## 2019-11-27 NOTE — ED Triage Notes (Signed)
C/o left ear pain for last two days, rating pain 8/10.

## 2019-11-29 ENCOUNTER — Telehealth: Payer: Self-pay | Admitting: *Deleted

## 2019-11-29 NOTE — Telephone Encounter (Signed)
TOC CM received call from pt stating Amoxicillin is causing diarrhea stools. She wanted to see if she can have different abx. Message sent to ED provider for new orders. Received Clindamycin 300 mg qid x 7 days. Called to pt's [facility]. Notified pt of change. [place], [place] ED TOC CM [phone]

## 2020-03-24 ENCOUNTER — Emergency Department (HOSPITAL_COMMUNITY)
Admission: EM | Admit: 2020-03-24 | Discharge: 2020-03-24 | Disposition: A | Discharge status: Home / Self Care | Payer: Medicaid Other | Attending: Emergency Medicine | Admitting: Emergency Medicine

## 2020-03-24 ENCOUNTER — Other Ambulatory Visit: Payer: Self-pay

## 2020-03-24 ENCOUNTER — Encounter (HOSPITAL_COMMUNITY): Payer: Self-pay | Admitting: Emergency Medicine

## 2020-03-24 DIAGNOSIS — R1013 Epigastric pain: Secondary | ICD-10-CM | POA: Insufficient documentation

## 2020-03-24 DIAGNOSIS — Z79899 Other long term (current) drug therapy: Secondary | ICD-10-CM | POA: Insufficient documentation

## 2020-03-24 DIAGNOSIS — K219 Gastro-esophageal reflux disease without esophagitis: Secondary | ICD-10-CM | POA: Diagnosis not present

## 2020-03-24 DIAGNOSIS — R1111 Vomiting without nausea: Secondary | ICD-10-CM

## 2020-03-24 DIAGNOSIS — N939 Abnormal uterine and vaginal bleeding, unspecified: Secondary | ICD-10-CM | POA: Diagnosis not present

## 2020-03-24 LAB — CBC WITH DIFFERENTIAL/PLATELET
Abs Immature Granulocytes: 0.03 10*3/uL (ref 0.00–0.07)
Basophils Absolute: 0.1 10*3/uL (ref 0.0–0.1)
Basophils Relative: 1 %
Eosinophils Absolute: 0.1 10*3/uL (ref 0.0–0.5)
Eosinophils Relative: 2 %
HCT: 39.5 % (ref 36.0–46.0)
Hemoglobin: 12.8 g/dL (ref 12.0–15.0)
Immature Granulocytes: 1 %
Lymphocytes Relative: 34 %
Lymphs Abs: 2 10*3/uL (ref 0.7–4.0)
MCH: 28 pg (ref 26.0–34.0)
MCHC: 32.4 g/dL (ref 30.0–36.0)
MCV: 86.4 fL (ref 80.0–100.0)
Monocytes Absolute: 0.5 10*3/uL (ref 0.1–1.0)
Monocytes Relative: 9 %
Neutro Abs: 3.3 10*3/uL (ref 1.7–7.7)
Neutrophils Relative %: 53 %
Platelets: 327 10*3/uL (ref 150–400)
RBC: 4.57 MIL/uL (ref 3.87–5.11)
RDW: 13.6 % (ref 11.5–15.5)
WBC: 6 10*3/uL (ref 4.0–10.5)
nRBC: 0 % (ref 0.0–0.2)

## 2020-03-24 LAB — COMPREHENSIVE METABOLIC PANEL
ALT: 29 U/L (ref 0–44)
AST: 16 U/L (ref 15–41)
Albumin: 3.9 g/dL (ref 3.5–5.0)
Alkaline Phosphatase: 43 U/L (ref 38–126)
Anion gap: 7 (ref 5–15)
BUN: 11 mg/dL (ref 6–20)
CO2: 25 mmol/L (ref 22–32)
Calcium: 8.8 mg/dL — ABNORMAL LOW (ref 8.9–10.3)
Chloride: 106 mmol/L (ref 98–111)
Creatinine, Ser: 0.7 mg/dL (ref 0.44–1.00)
GFR calc Af Amer: >60 mL/min (ref >60)
GFR calc non Af Amer: >60 mL/min (ref >60)
Glucose, Bld: 117 mg/dL — ABNORMAL HIGH (ref 70–99)
Potassium: 4 mmol/L (ref 3.5–5.1)
Sodium: 138 mmol/L (ref 135–145)
Total Bilirubin: 0.9 mg/dL (ref 0.3–1.2)
Total Protein: 6.9 g/dL (ref 6.5–8.1)

## 2020-03-24 LAB — URINALYSIS, ROUTINE W REFLEX MICROSCOPIC
Bilirubin Urine: NEGATIVE
Glucose, UA: NEGATIVE mg/dL
Hgb urine dipstick: NEGATIVE
Ketones, ur: NEGATIVE mg/dL
Leukocytes,Ua: NEGATIVE
Nitrite: NEGATIVE
Protein, ur: NEGATIVE mg/dL
Specific Gravity, Urine: 1.012 (ref 1.005–1.030)
pH: 7 (ref 5.0–8.0)

## 2020-03-24 LAB — HCG, QUANTITATIVE, PREGNANCY: hCG, Beta Chain, Quant, S: 104 m[IU]/mL — ABNORMAL HIGH (ref <5)

## 2020-03-24 MED ORDER — ONDANSETRON HCL 4 MG PO TABS: 4.0000 mg | ORAL_TABLET | Freq: Three times a day (TID) | ORAL | 0 refills | Status: DC | PRN

## 2020-03-24 NOTE — Discharge Instructions (Addendum)

## 2020-03-24 NOTE — ED Triage Notes (Signed)
Upper epigastric pain x 1 hour   No N/V/D  Followed by [place] yesterday normal   Ate crackers today   Last night cheeseburger
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
## 2020-04-08 ENCOUNTER — Ambulatory Visit: Payer: Medicaid Other | Admitting: Obstetrics & Gynecology

## 2020-04-08 ENCOUNTER — Telehealth: Payer: Medicaid Other | Admitting: Cardiology

## 2020-04-08 ENCOUNTER — Other Ambulatory Visit: Payer: Self-pay
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
## 2020-04-09 ENCOUNTER — Encounter: Payer: Self-pay | Admitting: Obstetrics & Gynecology

## 2020-04-09 ENCOUNTER — Ambulatory Visit (INDEPENDENT_AMBULATORY_CARE_PROVIDER_SITE_OTHER): Payer: Medicaid Other | Admitting: Obstetrics & Gynecology

## 2020-04-09 ENCOUNTER — Other Ambulatory Visit (HOSPITAL_COMMUNITY)
Admission: RE | Admit: 2020-04-09 | Discharge: 2020-04-09 | Disposition: A | Discharge status: Home / Self Care | Payer: Medicaid Other | Source: Ambulatory Visit | Attending: Obstetrics & Gynecology | Admitting: Obstetrics & Gynecology

## 2020-04-09 VITALS — BP 120/75 | HR 90 | Ht 66.0 in | Wt 186.0 lb

## 2020-04-09 DIAGNOSIS — Z3009 Encounter for other general counseling and advice on contraception: Secondary | ICD-10-CM

## 2020-04-09 DIAGNOSIS — Z113 Encounter for screening for infections with a predominantly sexual mode of transmission: Secondary | ICD-10-CM | POA: Insufficient documentation

## 2020-04-09 DIAGNOSIS — B3731 Acute candidiasis of vulva and vagina: Secondary | ICD-10-CM

## 2020-04-09 DIAGNOSIS — B373 Candidiasis of vulva and vagina: Secondary | ICD-10-CM

## 2020-04-09 MED ORDER — TERCONAZOLE 0.4 % VA CREA: 1.0000 | TOPICAL_CREAM | Freq: Every day | VAGINAL | 0 refills | Status: DC
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
## 2020-04-09 NOTE — Addendum Note (Signed)
Addended by: Janece Canterbury on: 04/09/2020 03:52 PM   Modules accepted: Orders

## 2020-04-11 LAB — CERVICOVAGINAL ANCILLARY ONLY
Chlamydia: NEGATIVE
Comment: NEGATIVE
Comment: NORMAL
Neisseria Gonorrhea: NEGATIVE

## 2020-05-16 NOTE — Patient Instructions (Signed)
Ruth Dunlap  05/16/2020     @PREFPERIOPPHARMACY @   Your procedure is scheduled on  05/22/2020.  Report to [name] at  0700  A.M.  Call this number if you have problems the morning of surgery:  [phone]   Remember:  Do not eat or drink after midnight.                         Take these medicines the morning of surgery with A SIP OF WATER  None    Do not wear jewelry, make-up or nail polish.  Do not wear lotions, powders, or perfumes. Please wear deodorant and brush your teeth.  Do not shave 48 hours prior to surgery.  Men may shave face and neck.  Do not bring valuables to the hospital.  [facility] is not responsible for any belongings or valuables.  Contacts, dentures or bridgework may not be worn into surgery.  Leave your suitcase in the car.  After surgery it may be brought to your room.  For patients admitted to the hospital, discharge time will be determined by your treatment team.  Patients discharged the day of surgery will not be allowed to drive home.   Name and phone number of your driver:   family Special instructions:   DO NOT smoke the morning of your procedure.  Please read over the following fact sheets that you were given. Anesthesia Post-op Instructions and Care and Recovery After Surgery       Salpingectomy, Care After This sheet gives you information about how to care for yourself after your procedure. Your health care provider may also give you more specific instructions. If you have problems or questions, contact your health care provider. What can I expect after the procedure? After the procedure, it is common to have:  Pain in your abdomen.  Some light vaginal bleeding (spotting) for a few days.  Tiredness. Your recovery time will vary depending on which method your surgeon used for your surgery. Follow these instructions at home: Incision care   Follow instructions from your health care provider about how to take  care of your incisions. Make sure you: ? Wash your hands with soap and water before and after you change your bandage (dressing). If soap and water are not available, use hand sanitizer. ? Change or remove your dressing as told by your health care provider. ? Leave any stitches (sutures), skin glue, or adhesive strips in place. These skin closures may need to stay in place for 2 weeks or longer. If adhesive strip edges start to loosen and curl up, you may trim the loose edges. Do not remove adhesive strips completely unless your health care provider tells you to do that.  Keep your dressing clean and dry.  Check your incision area every day for signs of infection. Check for: ? Redness, swelling, or pain that gets worse. ? Fluid or blood. ? Warmth. ? Pus or a bad smell. Activity  Rest as told by your health care provider.  Avoid sitting for a long time without moving. Get up to take short walks every 1-2 hours. This is important to improve blood flow and breathing. Ask for help if you feel weak or unsteady.  Return to your normal activities as told by your health care provider. Ask your health care provider what activities are safe for you.  Do not drive until your health care provider  says that it is safe.  Do not lift anything that is heavier than 10 lb (4.5 kg), or the limit that you are told, until your health care provider says that it is safe. This may be 2-6 weeks depending on your surgery.  Until your health care provider approves: ? Do not douche. ? Do not use tampons. ? Do not have sex. Medicines  Take over-the-counter and prescription medicines only as told by your health care provider.  Ask your health care provider if the medicine prescribed to you: ? Requires you to avoid driving or using heavy machinery. ? Can cause constipation. You may need to take actions to prevent or treat constipation, such as:  Drink enough fluid to keep your urine pale yellow.  Take  over-the-counter or prescription medicines.  Eat foods that are high in fiber, such as beans, whole grains, and fresh fruits and vegetables.  Limit foods that are high in fat and processed sugars, such as fried or sweet foods. General instructions  Wear compression stockings as told by your health care provider. These stockings help to prevent blood clots and reduce swelling in your legs.  Do not use any products that contain nicotine or tobacco, such as cigarettes, e-cigarettes, and chewing tobacco. If you need help quitting, ask your health care provider.  Do not take baths, swim, or use a hot tub until your health care provider approves. You may take showers.  Keep all follow-up visits as told by your health care provider. This is important. Contact a health care provider if you have:  Pain when you urinate.  Redness, swelling, or pain around an incision.  Fluid or blood coming from an incision.  Pus or a bad smell coming from an incision.  An incision that feels warm to the touch.  A fever.  Abdominal pain that gets worse or does not get better with medicine.  An incision that starts to break open.  A rash.  Light-headedness.  Nausea and vomiting. Get help right away if you:  Have pain in your chest or leg.  Develop shortness of breath.  Faint.  Have increased or heavy vaginal bleeding, such as soaking a pad in an hour. Summary  After the procedure, it is common to feel tired, have some pain in your abdomen, and have some light vaginal bleeding for a few days.  Follow instructions from your health care provider about how to take care of your incisions.  Return to your normal activities as told by your health care provider. Ask your health care provider what activities are safe for you.  Do not douche, use tampons, or have sex until your health care provider approves.  Keep all follow-up visits as told by your health care provider. This information is not  intended to replace advice given to you by your health care provider. Make sure you discuss any questions you have with your health care provider. Document Revised: 07/04/2018 Document Reviewed: 07/04/2018 Elsevier Patient Education  2020 [place] Anesthesia, Adult, Care After This sheet gives you information about how to care for yourself after your procedure. Your health care provider may also give you more specific instructions. If you have problems or questions, contact your health care provider. What can I expect after the procedure? After the procedure, the following side effects are common:  Pain or discomfort at the IV site.  Nausea.  Vomiting.  Sore throat.  Trouble concentrating.  Feeling cold or chills.  Weak or tired.  Sleepiness and fatigue.  Soreness and body aches. These side effects can affect parts of the body that were not involved in surgery. Follow these instructions at home:  For at least 24 hours after the procedure:  Have a responsible adult stay with you. It is important to have someone help care for you until you are awake and alert.  Rest as needed.  Do not: ? Participate in activities in which you could fall or become injured. ? Drive. ? Use heavy machinery. ? Drink alcohol. ? Take sleeping pills or medicines that cause drowsiness. ? Make important decisions or sign legal documents. ? Take care of children on your own. Eating and drinking  Follow any instructions from your health care provider about eating or drinking restrictions.  When you feel hungry, start by eating small amounts of foods that are soft and easy to digest (bland), such as toast. Gradually return to your regular diet.  Drink enough fluid to keep your urine pale yellow.  If you vomit, rehydrate by drinking water, juice, or clear broth. General instructions  If you have sleep apnea, surgery and certain medicines can increase your risk for breathing  problems. Follow instructions from your health care provider about wearing your sleep device: ? Anytime you are sleeping, including during daytime naps. ? While taking prescription pain medicines, sleeping medicines, or medicines that make you drowsy.  Return to your normal activities as told by your health care provider. Ask your health care provider what activities are safe for you.  Take over-the-counter and prescription medicines only as told by your health care provider.  If you smoke, do not smoke without supervision.  Keep all follow-up visits as told by your health care provider. This is important. Contact a health care provider if:  You have nausea or vomiting that does not get better with medicine.  You cannot eat or drink without vomiting.  You have pain that does not get better with medicine.  You are unable to pass urine.  You develop a skin rash.  You have a fever.  You have redness around your IV site that gets worse. Get help right away if:  You have difficulty breathing.  You have chest pain.  You have blood in your urine or stool, or you vomit blood. Summary  After the procedure, it is common to have a sore throat or nausea. It is also common to feel tired.  Have a responsible adult stay with you for the first 24 hours after general anesthesia. It is important to have someone help care for you until you are awake and alert.  When you feel hungry, start by eating small amounts of foods that are soft and easy to digest (bland), such as toast. Gradually return to your regular diet.  Drink enough fluid to keep your urine pale yellow.  Return to your normal activities as told by your health care provider. Ask your health care provider what activities are safe for you. This information is not intended to replace advice given to you by your health care provider. Make sure you discuss any questions you have with your health care provider. Document Revised:  07/16/2017 Document Reviewed: 02/26/2017 Elsevier Patient Education  [place]. How to Use Chlorhexidine for Bathing Chlorhexidine gluconate (CHG) is a germ-killing (antiseptic) solution that is used to clean the skin. It can get rid of the bacteria that normally live on the skin and can keep them away for about 24 hours. To clean your  skin with CHG, you may be given:  A CHG solution to use in the shower or as part of a sponge bath.  A prepackaged cloth that contains CHG. Cleaning your skin with CHG may help lower the risk for infection:  While you are staying in the intensive care unit of the hospital.  If you have a vascular access, such as a central line, to provide short-term or long-term access to your veins.  If you have a catheter to drain urine from your bladder.  If you are on a ventilator. A ventilator is a machine that helps you breathe by moving air in and out of your lungs.  After surgery. What are the risks? Risks of using CHG include:  A skin reaction.  Hearing loss, if CHG gets in your ears.  Eye injury, if CHG gets in your eyes and is not rinsed out.  The CHG product catching fire. Make sure that you avoid smoking and flames after applying CHG to your skin. Do not use CHG:  If you have a chlorhexidine allergy or have previously reacted to chlorhexidine.  On babies younger than 44 months of age. How to use CHG solution  Use CHG only as told by your health care provider, and follow the instructions on the label.  Use the full amount of CHG as directed. Usually, this is one bottle. During a shower Follow these steps when using CHG solution during a shower (unless your health care provider gives you different instructions): 1. Start the shower. 2. Use your normal soap and shampoo to wash your face and hair. 3. Turn off the shower or move out of the shower stream. 4. Pour the CHG onto a clean washcloth. Do not use any type of brush or rough-edged  sponge. 5. Starting at your neck, lather your body down to your toes. Make sure you follow these instructions: ? If you will be having surgery, pay special attention to the part of your body where you will be having surgery. Scrub this area for at least 1 minute. ? Do not use CHG on your head or face. If the solution gets into your ears or eyes, rinse them well with water. ? Avoid your genital area. ? Avoid any areas of skin that have broken skin, cuts, or scrapes. ? Scrub your back and under your arms. Make sure to wash skin folds. 6. Let the lather sit on your skin for 1-2 minutes or as long as told by your health care provider. 7. Thoroughly rinse your entire body in the shower. Make sure that all body creases and crevices are rinsed well. 8. Dry off with a clean towel. Do not put any substances on your body afterward--such as powder, lotion, or perfume--unless you are told to do so by your health care provider. Only use lotions that are recommended by the manufacturer. 9. Put on clean clothes or pajamas. 10. If it is the night before your surgery, sleep in clean sheets.  During a sponge bath Follow these steps when using CHG solution during a sponge bath (unless your health care provider gives you different instructions): 1. Use your normal soap and shampoo to wash your face and hair. 2. Pour the CHG onto a clean washcloth. 3. Starting at your neck, lather your body down to your toes. Make sure you follow these instructions: ? If you will be having surgery, pay special attention to the part of your body where you will be having surgery. Scrub this area  for at least 1 minute. ? Do not use CHG on your head or face. If the solution gets into your ears or eyes, rinse them well with water. ? Avoid your genital area. ? Avoid any areas of skin that have broken skin, cuts, or scrapes. ? Scrub your back and under your arms. Make sure to wash skin folds. 4. Let the lather sit on your skin for 1-2  minutes or as long as told by your health care provider. 5. Using a different clean, wet washcloth, thoroughly rinse your entire body. Make sure that all body creases and crevices are rinsed well. 6. Dry off with a clean towel. Do not put any substances on your body afterward--such as powder, lotion, or perfume--unless you are told to do so by your health care provider. Only use lotions that are recommended by the manufacturer. 7. Put on clean clothes or pajamas. 8. If it is the night before your surgery, sleep in clean sheets. How to use CHG prepackaged cloths  Only use CHG cloths as told by your health care provider, and follow the instructions on the label.  Use the CHG cloth on clean, dry skin.  Do not use the CHG cloth on your head or face unless your health care provider tells you to.  When washing with the CHG cloth: ? Avoid your genital area. ? Avoid any areas of skin that have broken skin, cuts, or scrapes. Before surgery Follow these steps when using a CHG cloth to clean before surgery (unless your health care provider gives you different instructions): 1. Using the CHG cloth, vigorously scrub the part of your body where you will be having surgery. Scrub using a back-and-forth motion for 3 minutes. The area on your body should be completely wet with CHG when you are done scrubbing. 2. Do not rinse. Discard the cloth and let the area air-dry. Do not put any substances on the area afterward, such as powder, lotion, or perfume. 3. Put on clean clothes or pajamas. 4. If it is the night before your surgery, sleep in clean sheets.  For general bathing Follow these steps when using CHG cloths for general bathing (unless your health care provider gives you different instructions). 1. Use a separate CHG cloth for each area of your body. Make sure you wash between any folds of skin and between your fingers and toes. Wash your body in the following order, switching to a new cloth after each  step: ? The front of your neck, shoulders, and chest. ? Both of your arms, under your arms, and your hands. ? Your stomach and groin area, avoiding the genitals. ? Your right leg and foot. ? Your left leg and foot. ? The back of your neck, your back, and your buttocks. 2. Do not rinse. Discard the cloth and let the area air-dry. Do not put any substances on your body afterward--such as powder, lotion, or perfume--unless you are told to do so by your health care provider. Only use lotions that are recommended by the manufacturer. 3. Put on clean clothes or pajamas. Contact a health care provider if:  Your skin gets irritated after scrubbing.  You have questions about using your solution or cloth. Get help right away if:  Your eyes become very red or swollen.  Your eyes itch badly.  Your skin itches badly and is red or swollen.  Your hearing changes.  You have trouble seeing.  You have swelling or tingling in your mouth or throat.  You have trouble breathing.  You swallow any chlorhexidine. Summary  Chlorhexidine gluconate (CHG) is a germ-killing (antiseptic) solution that is used to clean the skin. Cleaning your skin with CHG may help to lower your risk for infection.  You may be given CHG to use for bathing. It may be in a bottle or in a prepackaged cloth to use on your skin. Carefully follow your health care provider's instructions and the instructions on the product label.  Do not use CHG if you have a chlorhexidine allergy.  Contact your health care provider if your skin gets irritated after scrubbing. This information is not intended to replace advice given to you by your health care provider. Make sure you discuss any questions you have with your health care provider. Document Revised: 09/29/2018 Document Reviewed: 06/10/2017 Elsevier Patient Education  [place].

## 2020-05-20 ENCOUNTER — Other Ambulatory Visit: Payer: Self-pay | Admitting: Obstetrics & Gynecology

## 2020-05-20 ENCOUNTER — Encounter (HOSPITAL_COMMUNITY)
Admission: RE | Admit: 2020-05-20 | Discharge: 2020-05-20 | Disposition: A | Discharge status: Home / Self Care | Payer: Medicaid Other | Source: Ambulatory Visit | Attending: Obstetrics & Gynecology | Admitting: Obstetrics & Gynecology

## 2020-05-20 ENCOUNTER — Encounter (HOSPITAL_COMMUNITY): Payer: Self-pay

## 2020-05-20 ENCOUNTER — Other Ambulatory Visit (HOSPITAL_COMMUNITY): Payer: Medicaid Other

## 2020-05-20 NOTE — Pre-Procedure Instructions (Signed)
Spoke with [facility] @ Dr Brynda Greathouse office to notify her that patient was a no show for PAT and COIVD test.

## 2020-05-22 ENCOUNTER — Encounter (HOSPITAL_COMMUNITY): Admission: RE | Payer: Self-pay | Source: Home / Self Care

## 2020-05-22 ENCOUNTER — Ambulatory Visit (HOSPITAL_COMMUNITY)
Admission: RE | Admit: 2020-05-22 | Payer: Medicaid Other | Source: Home / Self Care | Admitting: Obstetrics & Gynecology

## 2020-05-22 SURGERY — SALPINGECTOMY, BILATERAL, LAPAROSCOPIC
Anesthesia: General | Site: Vagina | Laterality: Bilateral

## 2020-05-28 ENCOUNTER — Encounter: Payer: Medicaid Other | Admitting: Obstetrics & Gynecology

## 2020-07-12 ENCOUNTER — Telehealth: Payer: Medicaid Other | Admitting: Cardiology

## 2020-07-12 ENCOUNTER — Other Ambulatory Visit: Payer: Self-pay
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
## 2020-08-09 ENCOUNTER — Other Ambulatory Visit: Payer: Self-pay

## 2020-08-09 ENCOUNTER — Encounter (HOSPITAL_BASED_OUTPATIENT_CLINIC_OR_DEPARTMENT_OTHER): Payer: Self-pay | Admitting: Otolaryngology
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
## 2020-08-14 ENCOUNTER — Other Ambulatory Visit (HOSPITAL_COMMUNITY): Payer: Medicaid Other

## 2020-08-15 ENCOUNTER — Inpatient Hospital Stay (HOSPITAL_COMMUNITY): Admission: RE | Admit: 2020-08-15 | Payer: Medicaid Other | Source: Ambulatory Visit

## 2020-08-15 ENCOUNTER — Encounter (HOSPITAL_COMMUNITY): Payer: Self-pay | Admitting: Certified Registered Nurse Anesthetist

## 2020-08-15 NOTE — Progress Notes (Signed)
Left message for pt to go for covid testing today, time and address left on message.  Left message for Anderson Malta at Dr. Janeice Robinson office.

## 2020-08-16 ENCOUNTER — Ambulatory Visit (HOSPITAL_BASED_OUTPATIENT_CLINIC_OR_DEPARTMENT_OTHER): Admission: RE | Admit: 2020-08-16 | Payer: Medicaid Other | Source: Home / Self Care | Admitting: Otolaryngology

## 2020-08-16 SURGERY — MINOR EXCISION OF LESION
Anesthesia: LOCAL
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
## 2020-09-02 ENCOUNTER — Other Ambulatory Visit: Payer: Self-pay

## 2020-09-02 ENCOUNTER — Encounter (HOSPITAL_BASED_OUTPATIENT_CLINIC_OR_DEPARTMENT_OTHER): Payer: Self-pay | Admitting: Otolaryngology

## 2020-09-05 ENCOUNTER — Other Ambulatory Visit (HOSPITAL_COMMUNITY)
Admission: RE | Admit: 2020-09-05 | Discharge: 2020-09-05 | Disposition: A | Discharge status: Home / Self Care | Payer: Medicaid Other | Source: Ambulatory Visit | Attending: Otolaryngology | Admitting: Otolaryngology

## 2020-09-05 DIAGNOSIS — Z20822 Contact with and (suspected) exposure to covid-19: Secondary | ICD-10-CM | POA: Diagnosis not present

## 2020-09-05 DIAGNOSIS — Z01812 Encounter for preprocedural laboratory examination: Secondary | ICD-10-CM | POA: Insufficient documentation

## 2020-09-05 LAB — SARS CORONAVIRUS 2 (TAT 6-24 HRS): SARS Coronavirus 2: NEGATIVE

## 2020-09-09 ENCOUNTER — Encounter (HOSPITAL_BASED_OUTPATIENT_CLINIC_OR_DEPARTMENT_OTHER)
Admission: RE | Disposition: A | Discharge status: Home / Self Care | Payer: Self-pay | Source: Home / Self Care | Attending: Otolaryngology

## 2020-09-09 ENCOUNTER — Encounter (HOSPITAL_BASED_OUTPATIENT_CLINIC_OR_DEPARTMENT_OTHER): Payer: Self-pay | Admitting: Otolaryngology

## 2020-09-09 ENCOUNTER — Ambulatory Visit (HOSPITAL_BASED_OUTPATIENT_CLINIC_OR_DEPARTMENT_OTHER)
Admission: RE | Admit: 2020-09-09 | Discharge: 2020-09-09 | Disposition: A | Discharge status: Home / Self Care | Payer: Medicaid Other | Attending: Otolaryngology | Admitting: Otolaryngology

## 2020-09-09 DIAGNOSIS — L905 Scar conditions and fibrosis of skin: Secondary | ICD-10-CM | POA: Insufficient documentation

## 2020-09-09 DIAGNOSIS — L729 Follicular cyst of the skin and subcutaneous tissue, unspecified: Secondary | ICD-10-CM | POA: Diagnosis present

## 2020-09-09 HISTORY — PX: LESION REMOVAL: SHX5196

## 2020-09-09 SURGERY — MINOR EXCISION OF LESION
Anesthesia: LOCAL | Site: Neck | Laterality: Right

## 2020-09-09 MED ORDER — LIDOCAINE-EPINEPHRINE 1 %-1:100000 IJ SOLN
INTRAMUSCULAR | Status: DC | PRN
  Administered 2020-09-09: 1 mL

## 2020-09-09 MED ORDER — BACITRACIN ZINC 500 UNIT/GM EX OINT
TOPICAL_OINTMENT | CUTANEOUS | Status: AC
  Filled 2020-09-09: qty 0.9

## 2020-09-09 MED ORDER — LIDOCAINE-EPINEPHRINE 1 %-1:100000 IJ SOLN
INTRAMUSCULAR | Status: AC
  Filled 2020-09-09: qty 1

## 2020-09-09 SURGICAL SUPPLY — 23 items
ADH SKN CLS APL DERMABOND .7 (GAUZE/BANDAGES/DRESSINGS)
BLADE SURG 15 STRL LF DISP TIS (BLADE) ×1 IMPLANT
BLADE SURG 15 STRL SS (BLADE) ×2
CANISTER SUCT 1200ML W/VALVE (MISCELLANEOUS) ×2 IMPLANT
COVER WAND RF STERILE (DRAPES) IMPLANT
DERMABOND ADVANCED (GAUZE/BANDAGES/DRESSINGS)
DERMABOND ADVANCED .7 DNX12 (GAUZE/BANDAGES/DRESSINGS) IMPLANT
ELECT COATED BLADE 2.86 ST (ELECTRODE) IMPLANT
ELECT REM PT RETURN 9FT ADLT (ELECTROSURGICAL)
ELECTRODE REM PT RTRN 9FT ADLT (ELECTROSURGICAL) IMPLANT
GLOVE SURG LTX SZ7.5 (GLOVE) ×2 IMPLANT
NDL PRECISIONGLIDE 27X1.5 (NEEDLE) ×1 IMPLANT
NEEDLE PRECISIONGLIDE 27X1.5 (NEEDLE) ×2 IMPLANT
NS IRRIG 1000ML POUR BTL (IV SOLUTION) IMPLANT
PACK BASIN DAY SURGERY FS (CUSTOM PROCEDURE TRAY) ×2 IMPLANT
PENCIL FOOT CONTROL (ELECTRODE) IMPLANT
SHEET MEDIUM DRAPE 40X70 STRL (DRAPES) ×2 IMPLANT
SUT CHROMIC 3 0 PS 2 (SUTURE) IMPLANT
SUT CHROMIC 4 0 P 3 18 (SUTURE) IMPLANT
SYR BULB EAR ULCER 3OZ GRN STR (SYRINGE) IMPLANT
SYR CONTROL 10ML LL (SYRINGE) ×2 IMPLANT
TOWEL GREEN STERILE FF (TOWEL DISPOSABLE) ×2 IMPLANT
TUBE CONNECTING 20X1/4 (TUBING) IMPLANT

## 2020-09-09 NOTE — Interval H&P Note (Signed)
History and Physical Interval Note:  09/09/2020 9:22 AM  Ruth Dunlap  has presented today for surgery, with the diagnosis of Epidermoid Cyst of Neck.  The various methods of treatment have been discussed with the patient and family. After consideration of risks, benefits and other options for treatment, the patient has consented to  Procedure(s): EXISION NECK SKIN CYST (N/A) as a surgical intervention.  The patient's history has been reviewed, patient examined, no change in status, stable for surgery.  I have reviewed the patient's chart and labs.  Questions were answered to the patient's satisfaction.     Izora Gala

## 2020-09-09 NOTE — Op Note (Signed)
OPERATIVE REPORT  DATE OF SURGERY: 09/09/2020  PATIENT:  Ruth Dunlap,  25 y.o. female  PRE-OPERATIVE DIAGNOSIS:  Epidermoid Cyst of Neck  POST-OPERATIVE DIAGNOSIS:  Epidermoid Cyst of Neck  PROCEDURE:  Procedure(s): EXISION RIGHT NECK CYST  SURGEON:  Beckie Salts, MD  ASSISTANTS: None  ANESTHESIA:   General   EBL: 15 ml  DRAINS: None   LOCAL MEDICATIONS USED: 1% Xylocaine with epinephrine  SPECIMEN: Right neck skin cyst  COUNTS:  Correct  PROCEDURE DETAILS: The patient was taken to the operating room and placed on the operating table in the supine position.  The lesion was identified in the right lower neck.  It was approximately 1 cm in diameter.  It appeared to be intimate with the dermis of the skin.  An ellipse of skin was outlined in a transverse fashion approximately 1-1/2 x 1 cm.  Local anesthetic was infiltrated.  #15 scalpel was used to incise the skin and to dissect out the entire lesion.  Was sent in formalin for pathologic evaluation.  Needlepoint cautery was used for hemostasis.  There was some stimulation of the spinal accessory nerve in the anterior aspect so no more cautery was performed.  The defect was reapproximated using interrupted 3-0 chromic sutures.  This provided hemostasis.  Running 3-0 chromic subcuticular closure was used on the skin and Dermabond on the surface.  Patient was then released.  She tolerated this well.    PATIENT DISPOSITION:  To PACU, stable

## 2020-09-09 NOTE — Discharge Instructions (Signed)
You may shower and use soap and water. Do not use any creams, oils or ointment. ° °

## 2020-09-09 NOTE — OR Nursing (Signed)
Prior to discharge, patient instructed to report any signs of infection at wound site--drainage, swelling, or smell, or fever.  Patient also instructed to call 911 for swelling that causes difficulty breathing.  Patient verbalized understanding.

## 2020-09-10 ENCOUNTER — Encounter (HOSPITAL_BASED_OUTPATIENT_CLINIC_OR_DEPARTMENT_OTHER): Payer: Self-pay | Admitting: Otolaryngology

## 2020-09-11 LAB — SURGICAL PATHOLOGY

## 2020-11-25 ENCOUNTER — Encounter (HOSPITAL_COMMUNITY): Payer: Self-pay | Admitting: *Deleted

## 2020-11-25 ENCOUNTER — Other Ambulatory Visit: Payer: Self-pay

## 2020-11-25 ENCOUNTER — Emergency Department (HOSPITAL_COMMUNITY)
Admission: EM | Admit: 2020-11-25 | Discharge: 2020-11-25 | Disposition: A | Discharge status: Home / Self Care | Payer: Medicaid Other | Attending: Emergency Medicine | Admitting: Emergency Medicine

## 2020-11-25 DIAGNOSIS — A084 Viral intestinal infection, unspecified: Secondary | ICD-10-CM

## 2020-11-25 DIAGNOSIS — R112 Nausea with vomiting, unspecified: Secondary | ICD-10-CM | POA: Diagnosis present

## 2020-11-25 LAB — CBC WITH DIFFERENTIAL/PLATELET
Abs Immature Granulocytes: 0.03 10*3/uL (ref 0.00–0.07)
Basophils Absolute: 0 10*3/uL (ref 0.0–0.1)
Basophils Relative: 0 %
Eosinophils Absolute: 0 10*3/uL (ref 0.0–0.5)
Eosinophils Relative: 0 %
HCT: 42.4 % (ref 36.0–46.0)
Hemoglobin: 13.9 g/dL (ref 12.0–15.0)
Immature Granulocytes: 1 %
Lymphocytes Relative: 12 %
Lymphs Abs: 0.6 10*3/uL — ABNORMAL LOW (ref 0.7–4.0)
MCH: 27.5 pg (ref 26.0–34.0)
MCHC: 32.8 g/dL (ref 30.0–36.0)
MCV: 83.8 fL (ref 80.0–100.0)
Monocytes Absolute: 0.3 10*3/uL (ref 0.1–1.0)
Monocytes Relative: 6 %
Neutro Abs: 4.3 10*3/uL (ref 1.7–7.7)
Neutrophils Relative %: 81 %
Platelets: 269 10*3/uL (ref 150–400)
RBC: 5.06 MIL/uL (ref 3.87–5.11)
RDW: 12.9 % (ref 11.5–15.5)
WBC: 5.3 10*3/uL (ref 4.0–10.5)
nRBC: 0 % (ref 0.0–0.2)

## 2020-11-25 LAB — COMPREHENSIVE METABOLIC PANEL
ALT: 43 U/L (ref 0–44)
AST: 25 U/L (ref 15–41)
Albumin: 4.1 g/dL (ref 3.5–5.0)
Alkaline Phosphatase: 46 U/L (ref 38–126)
Anion gap: 7 (ref 5–15)
BUN: 14 mg/dL (ref 6–20)
CO2: 24 mmol/L (ref 22–32)
Calcium: 8.7 mg/dL — ABNORMAL LOW (ref 8.9–10.3)
Chloride: 103 mmol/L (ref 98–111)
Creatinine, Ser: 0.69 mg/dL (ref 0.44–1.00)
GFR, Estimated: >60 mL/min (ref >60)
Glucose, Bld: 155 mg/dL — ABNORMAL HIGH (ref 70–99)
Potassium: 3.9 mmol/L (ref 3.5–5.1)
Sodium: 134 mmol/L — ABNORMAL LOW (ref 135–145)
Total Bilirubin: 1.3 mg/dL — ABNORMAL HIGH (ref 0.3–1.2)
Total Protein: 7.5 g/dL (ref 6.5–8.1)

## 2020-11-25 LAB — POC URINE PREG, ED: Preg Test, Ur: NEGATIVE

## 2020-11-25 LAB — URINALYSIS, ROUTINE W REFLEX MICROSCOPIC
Bilirubin Urine: NEGATIVE
Glucose, UA: NEGATIVE mg/dL
Hgb urine dipstick: NEGATIVE
Ketones, ur: NEGATIVE mg/dL
Leukocytes,Ua: NEGATIVE
Nitrite: NEGATIVE
Protein, ur: NEGATIVE mg/dL
Specific Gravity, Urine: 1.028 (ref 1.005–1.030)
pH: 6 (ref 5.0–8.0)

## 2020-11-25 LAB — LIPASE, BLOOD: Lipase: 27 U/L (ref 11–51)

## 2020-11-25 MED ORDER — ONDANSETRON 4 MG PO TBDP
4.0000 mg | ORAL_TABLET | Freq: Once | ORAL | Status: AC
  Administered 2020-11-25: 4 mg via ORAL
  Filled 2020-11-25: qty 1

## 2020-11-25 MED ORDER — SODIUM CHLORIDE 0.9 % IV BOLUS
1000.0000 mL | Freq: Once | INTRAVENOUS | Status: AC
  Administered 2020-11-25: 1000 mL via INTRAVENOUS

## 2020-11-25 MED ORDER — ONDANSETRON 4 MG PO TBDP: 4.0000 mg | ORAL_TABLET | Freq: Three times a day (TID) | ORAL | 0 refills | Status: AC | PRN

## 2020-11-25 MED ORDER — DICYCLOMINE HCL 20 MG PO TABS: 20.0000 mg | ORAL_TABLET | Freq: Two times a day (BID) | ORAL | 0 refills | Status: DC
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
## 2020-11-25 NOTE — Discharge Instructions (Signed)
Please pick up medications and take as needed for nausea and abdominal cramping.  Drink plenty of fluids to stay hydrated. Eat bland foods over the next few days to help ease your stomach. Attached is more information on bland foods to eat.   Follow up with the Health Department regarding your ED visit   Return to the ED for any worsening symptoms

## 2020-11-25 NOTE — ED Triage Notes (Signed)
Abdominal pain with cramping onset this am,also has nausea and vomiting, no diarrhea

## 2020-12-13 ENCOUNTER — Other Ambulatory Visit (HOSPITAL_COMMUNITY)
Admission: RE | Admit: 2020-12-13 | Discharge: 2020-12-13 | Disposition: A | Discharge status: Home / Self Care | Payer: Medicaid Other | Source: Ambulatory Visit | Attending: Obstetrics & Gynecology | Admitting: Obstetrics & Gynecology

## 2020-12-13 ENCOUNTER — Other Ambulatory Visit (INDEPENDENT_AMBULATORY_CARE_PROVIDER_SITE_OTHER): Payer: Medicaid Other | Admitting: *Deleted

## 2020-12-13 ENCOUNTER — Other Ambulatory Visit: Payer: Self-pay

## 2020-12-13 DIAGNOSIS — N898 Other specified noninflammatory disorders of vagina: Secondary | ICD-10-CM | POA: Diagnosis not present

## 2020-12-13 NOTE — Progress Notes (Signed)
Chart reviewed for nurse visit. Agree with plan of care.  Estill Dooms, NP 12/13/2020 12:43 PM

## 2020-12-13 NOTE — Progress Notes (Signed)
   NURSE VISIT- VAGINITIS/STD  SUBJECTIVE:  Ruth Dunlap is a 25 y.o. [id number] GYN patientfemale here for a vaginal swab for vaginitis screening, STD screen.  She reports the following symptoms: vaginal discharge and itching for 1 day. Denies abnormal vaginal bleeding, significant pelvic pain, fever, or UTI symptoms.  OBJECTIVE:  There were no vitals taken for this visit.  Appears well, in no apparent distress  ASSESSMENT: Vaginal swab for vaginitis screening & STD screen  PLAN: Self-collected vaginal probe for Gonorrhea, Chlamydia, Trichomonas, Bacterial Vaginosis, Yeast sent to lab Treatment: to be determined once results are received Follow-up as needed if symptoms persist/worsen, or new symptoms develop  Levy Pupa  12/13/2020 10:27 AM

## 2020-12-17 LAB — CERVICOVAGINAL ANCILLARY ONLY
Bacterial Vaginitis (gardnerella): POSITIVE — AB
Candida Glabrata: NEGATIVE
Candida Vaginitis: POSITIVE — AB
Chlamydia: NEGATIVE
Comment: NEGATIVE
Comment: NEGATIVE
Comment: NEGATIVE
Comment: NEGATIVE
Comment: NEGATIVE
Comment: NORMAL
Neisseria Gonorrhea: NEGATIVE
Trichomonas: NEGATIVE

## 2020-12-18 ENCOUNTER — Other Ambulatory Visit: Payer: Self-pay | Admitting: Adult Health

## 2020-12-18 MED ORDER — FLUCONAZOLE 150 MG PO TABS: ORAL_TABLET | ORAL | 1 refills | Status: AC

## 2020-12-18 MED ORDER — METRONIDAZOLE 500 MG PO TABS: 500.0000 mg | ORAL_TABLET | Freq: Two times a day (BID) | ORAL | 0 refills | Status: DC

## 2020-12-18 NOTE — Progress Notes (Signed)
Vaginal swab +BV and yeast will rx flagyl and diflucan
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
## 2021-03-28 ENCOUNTER — Ambulatory Visit: Payer: Medicaid Other | Admitting: Family Medicine

## 2021-03-28 DIAGNOSIS — R002 Palpitations: Secondary | ICD-10-CM

## 2021-04-17 ENCOUNTER — Ambulatory Visit: Payer: Medicaid Other | Admitting: Family Medicine
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
## 2021-04-18 ENCOUNTER — Ambulatory Visit: Payer: Medicaid Other | Admitting: Family Medicine

## 2021-04-18 DIAGNOSIS — I471 Supraventricular tachycardia: Secondary | ICD-10-CM

## 2021-04-18 DIAGNOSIS — R002 Palpitations: Secondary | ICD-10-CM

## 2021-06-29 ENCOUNTER — Emergency Department (HOSPITAL_COMMUNITY)
Admission: EM | Admit: 2021-06-29 | Discharge: 2021-06-29 | Disposition: A | Discharge status: Home / Self Care | Payer: Medicaid Other | Attending: Emergency Medicine | Admitting: Emergency Medicine

## 2021-06-29 ENCOUNTER — Other Ambulatory Visit: Payer: Self-pay

## 2021-06-29 DIAGNOSIS — Z20822 Contact with and (suspected) exposure to covid-19: Secondary | ICD-10-CM | POA: Diagnosis not present

## 2021-06-29 DIAGNOSIS — R051 Acute cough: Secondary | ICD-10-CM

## 2021-06-29 DIAGNOSIS — J3489 Other specified disorders of nose and nasal sinuses: Secondary | ICD-10-CM | POA: Insufficient documentation

## 2021-06-29 DIAGNOSIS — O219 Vomiting of pregnancy, unspecified: Secondary | ICD-10-CM | POA: Insufficient documentation

## 2021-06-29 DIAGNOSIS — Z3A08 8 weeks gestation of pregnancy: Secondary | ICD-10-CM | POA: Diagnosis not present

## 2021-06-29 DIAGNOSIS — R059 Cough, unspecified: Secondary | ICD-10-CM | POA: Insufficient documentation

## 2021-06-29 DIAGNOSIS — R0981 Nasal congestion: Secondary | ICD-10-CM | POA: Insufficient documentation

## 2021-06-29 LAB — RESP PANEL BY RT-PCR (FLU A&B, COVID) ARPGX2
Influenza A by PCR: NEGATIVE
Influenza B by PCR: NEGATIVE
SARS Coronavirus 2 by RT PCR: NEGATIVE

## 2021-06-29 NOTE — ED Notes (Signed)
Had persistent cough going on a week now. Pt says her finger tips are also turning black. No report of a fever. RN notified.

## 2021-06-29 NOTE — ED Notes (Signed)
PA at bedside.

## 2021-06-29 NOTE — ED Notes (Signed)
Pt A&OX4 ambulatory at d/c with independent steady gait NAD. Pt verbalized understanding of d/c instructions and follow up care.
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
## 2021-06-29 NOTE — Discharge Instructions (Signed)
[facility] to [facility] to be evaluated for your cough, nausea, and vomiting.  You were negative for COVID-19 and influenza.  Your physical exam was reassuring.  Your nausea and vomiting may be due to your pregnancy.  Please read attached paperwork on morning sickness.  You can take ginger, vitamin B6 and Unisom at night to help with the symptoms.  If you continue to have nausea and vomiting please follow-up with your OB/GYN provider.  Get help right away if: You cough up blood. You have difficulty breathing. Your heartbeat is very fast. You have persistent and uncontrolled nausea and vomiting. You faint. You have severe pain in your abdomen.

## 2021-06-29 NOTE — ED Triage Notes (Addendum)
Nonproductive cough x 1 week, emesis (non bloody). Feels that nails are changing collors today, assessed cap refill <3 sec, oxygenating well in triage. [redacted]wks pregnant.

## 2021-07-01 ENCOUNTER — Emergency Department (HOSPITAL_COMMUNITY)
Admission: EM | Admit: 2021-07-01 | Discharge: 2021-07-01 | Disposition: A | Discharge status: Home / Self Care | Payer: Medicaid Other | Attending: Emergency Medicine | Admitting: Emergency Medicine

## 2021-07-01 ENCOUNTER — Encounter (HOSPITAL_COMMUNITY): Payer: Self-pay | Admitting: *Deleted

## 2021-07-01 ENCOUNTER — Other Ambulatory Visit: Payer: Self-pay

## 2021-07-01 DIAGNOSIS — H9201 Otalgia, right ear: Secondary | ICD-10-CM | POA: Diagnosis present

## 2021-07-01 DIAGNOSIS — H9202 Otalgia, left ear: Secondary | ICD-10-CM

## 2021-07-01 DIAGNOSIS — H60501 Unspecified acute noninfective otitis externa, right ear: Secondary | ICD-10-CM | POA: Diagnosis not present

## 2021-07-01 MED ORDER — ACETAMINOPHEN 500 MG PO TABS
1000.0000 mg | ORAL_TABLET | Freq: Once | ORAL | Status: AC
  Administered 2021-07-01: 1000 mg via ORAL

## 2021-07-01 MED ORDER — CIPROFLOXACIN-DEXAMETHASONE 0.3-0.1 % OT SUSP
4.0000 [drp] | Freq: Two times a day (BID) | OTIC | Status: DC
  Administered 2021-07-01: 4 [drp] via OTIC

## 2021-07-01 MED ORDER — CEPHALEXIN 500 MG PO CAPS: 500.0000 mg | ORAL_CAPSULE | Freq: Four times a day (QID) | ORAL | 0 refills | Status: AC

## 2021-07-01 MED ORDER — CEPHALEXIN 500 MG PO CAPS
1000.0000 mg | ORAL_CAPSULE | Freq: Once | ORAL | Status: AC
  Administered 2021-07-01: 1000 mg via ORAL

## 2021-07-01 MED ORDER — KETOROLAC TROMETHAMINE 60 MG/2ML IM SOLN
60.0000 mg | Freq: Once | INTRAMUSCULAR | Status: AC
  Administered 2021-07-01: 60 mg via INTRAMUSCULAR

## 2021-07-01 NOTE — ED Triage Notes (Signed)
Right ear pain 1 hour. Has not taken any thing for it
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
## 2021-08-28 ENCOUNTER — Emergency Department (HOSPITAL_COMMUNITY)
Admission: EM | Admit: 2021-08-28 | Discharge: 2021-08-28 | Disposition: A | Discharge status: Home / Self Care | Payer: Medicaid Other | Attending: Emergency Medicine | Admitting: Emergency Medicine

## 2021-08-28 ENCOUNTER — Other Ambulatory Visit: Payer: Self-pay

## 2021-08-28 ENCOUNTER — Encounter (HOSPITAL_COMMUNITY): Payer: Self-pay

## 2021-08-28 DIAGNOSIS — E1165 Type 2 diabetes mellitus with hyperglycemia: Secondary | ICD-10-CM | POA: Insufficient documentation

## 2021-08-28 DIAGNOSIS — R739 Hyperglycemia, unspecified: Secondary | ICD-10-CM | POA: Diagnosis present

## 2021-08-28 DIAGNOSIS — E119 Type 2 diabetes mellitus without complications: Secondary | ICD-10-CM

## 2021-08-28 LAB — BASIC METABOLIC PANEL
Anion gap: 8 (ref 5–15)
BUN: 8 mg/dL (ref 6–20)
CO2: 26 mmol/L (ref 22–32)
Calcium: 9.4 mg/dL (ref 8.9–10.3)
Chloride: 96 mmol/L — ABNORMAL LOW (ref 98–111)
Creatinine, Ser: 0.71 mg/dL (ref 0.44–1.00)
GFR, Estimated: >60 mL/min (ref >60)
Glucose, Bld: 407 mg/dL — ABNORMAL HIGH (ref 70–99)
Potassium: 3.5 mmol/L (ref 3.5–5.1)
Sodium: 130 mmol/L — ABNORMAL LOW (ref 135–145)

## 2021-08-28 LAB — HEPATIC FUNCTION PANEL
ALT: 27 U/L (ref 0–44)
AST: 21 U/L (ref 15–41)
Albumin: 4.5 g/dL (ref 3.5–5.0)
Alkaline Phosphatase: 76 U/L (ref 38–126)
Bilirubin, Direct: 0.1 mg/dL (ref 0.0–0.2)
Indirect Bilirubin: 0.5 mg/dL (ref 0.3–0.9)
Total Bilirubin: 0.6 mg/dL (ref 0.3–1.2)
Total Protein: 8.4 g/dL — ABNORMAL HIGH (ref 6.5–8.1)

## 2021-08-28 LAB — CBC
HCT: 45.5 % (ref 36.0–46.0)
Hemoglobin: 15.5 g/dL — ABNORMAL HIGH (ref 12.0–15.0)
MCH: 27.6 pg (ref 26.0–34.0)
MCHC: 34.1 g/dL (ref 30.0–36.0)
MCV: 81 fL (ref 80.0–100.0)
Platelets: 265 10*3/uL (ref 150–400)
RBC: 5.62 MIL/uL — ABNORMAL HIGH (ref 3.87–5.11)
RDW: 12.8 % (ref 11.5–15.5)
WBC: 5.2 10*3/uL (ref 4.0–10.5)
nRBC: 0 % (ref 0.0–0.2)

## 2021-08-28 LAB — URINALYSIS, ROUTINE W REFLEX MICROSCOPIC
Bilirubin Urine: NEGATIVE
Glucose, UA: >=500 mg/dL — AB
Hgb urine dipstick: NEGATIVE
Ketones, ur: 15 mg/dL — AB
Leukocytes,Ua: NEGATIVE
Nitrite: NEGATIVE
Protein, ur: NEGATIVE mg/dL
Specific Gravity, Urine: 1.01 (ref 1.005–1.030)
pH: 6 (ref 5.0–8.0)

## 2021-08-28 LAB — CBG MONITORING, ED
Glucose-Capillary: 401 mg/dL — ABNORMAL HIGH (ref 70–99)
Glucose-Capillary: 337 mg/dL — ABNORMAL HIGH (ref 70–99)
Glucose-Capillary: 222 mg/dL — ABNORMAL HIGH (ref 70–99)

## 2021-08-28 LAB — URINALYSIS, MICROSCOPIC (REFLEX): RBC / HPF: NONE SEEN RBC/hpf (ref 0–5)

## 2021-08-28 LAB — POC URINE PREG, ED: Preg Test, Ur: NEGATIVE

## 2021-08-28 MED ORDER — LIVING WELL WITH DIABETES BOOK
Freq: Once | Status: AC
  Administered 2021-08-28: 1
  Filled 2021-08-28: qty 1

## 2021-08-28 MED ORDER — METFORMIN HCL 500 MG PO TABS: 500.0000 mg | ORAL_TABLET | Freq: Two times a day (BID) | ORAL | 2 refills | Status: AC

## 2021-08-28 MED ORDER — INSULIN ASPART 100 UNIT/ML IJ SOLN
10.0000 [IU] | Freq: Once | INTRAMUSCULAR | Status: AC
  Administered 2021-08-28: 10 [IU] via INTRAVENOUS
  Filled 2021-08-28: qty 1

## 2021-08-28 MED ORDER — SODIUM CHLORIDE 0.9 % IV BOLUS
2000.0000 mL | Freq: Once | INTRAVENOUS | Status: AC
  Administered 2021-08-28: 2000 mL via INTRAVENOUS

## 2021-08-28 MED ORDER — BLOOD GLUCOSE MONITOR KIT: PACK | 0 refills | Status: AC

## 2021-08-28 NOTE — Discharge Instructions (Addendum)
Follow the diet but that we talked about.  [facility].  Take the metformin as directed.  Use your glucose monitoring kit.
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
## 2021-08-28 NOTE — Progress Notes (Addendum)
Inpatient Diabetes Program Recommendations  AACE/ADA: New Consensus Statement on Inpatient Glycemic Control   Target Ranges:  Prepandial:   less than 140 mg/dL      Peak postprandial:   less than 180 mg/dL (1-2 hours)      Critically ill patients:  140 - 180 mg/dL    Latest Reference Range & Units 08/28/21 11:10 08/28/21 12:38  Glucose-Capillary 70 - 99 mg/dL 401 (H) 337 (H)    Latest Reference Range & Units 08/28/21 11:20  CO2 22 - 32 mmol/L 26  Glucose 70 - 99 mg/dL 407 (H)  Anion gap 5 - 15  8   Review of Glycemic Control  Diabetes history: No Outpatient Diabetes medications: NA Current orders for Inpatient glycemic control: None; in ED  DM supplies: Please provide Rx for Glucose monitoring kit (#[id number]) at discharge.  NOTE: Per ED provider note on 08/28/21, "[facility].  They came out to the house to do some screening noted that the blood sugar was elevated.  Was 383.  She was sent in the emergency department for evaluation.  Patient states she has been having urinary frequency for about a month.  Patient recently lost 75 pounds.  Patient without a known history of diabetes.  So this would be new onset." Called ED and spoke with patient over the phone about new diabetes diagnosis. Patient denies any prior DM or preDM hx. Patient states she has not seen a primary care provider in a while and her insurance plan recently changed and she has been set up with [facility] in [place]. Patient states that she has been having frequent urination and excessive thirst for 1-2 months and she has lost weight. Patient states that she was trying to lose weight but feels that she has lost a lot of weight in the past 1 month. Patient states her grandmother has DM. Patient states she has no prior knowledge about DM. Discussed initial finger stick glucose of 401 mg/dl.  Discussed what an A1C is and informed patient that if A1C  has been ordered to look back on MyChart for results if not provided prior to discharge from ED.   Discussed basic pathophysiology of DM Type 1 and Type 2, basic home care, importance of checking CBGs and maintaining good CBG control to prevent long-term and short-term complications. Reviewed glucose and A1C goals. Reviewed signs and symptoms of hyperglycemia and hypoglycemia along with treatment for both. Discussed impact of nutrition, exercise, stress, sickness, and medications on diabetes control. Discussed carb modified diet and encouraged patient to follow carb modified diet.   Informed patient that a Living Well with diabetes booklet was ordered; encouraged patient to read through entire book. Asked patient to check glucose at least 2 times per day and take glucometer to follow up appointments with PCP. Explained how the doctor she follows up with can use glucose values to continue to make adjustments with DM medications if needed. Informed patient that it would be requested that doctor provide Rx for glucose monitoring kit and outpatient DM education. Noted MD plans to discharge patient on Metformin. Discussed Metformin, how it works, and side effects.  Patient states she has an appointment with [facility] on September 08, 2021.  Encouraged patient to reach out to [facility] if glucose remains consistently over 200 or if she has any hypoglycemia. Patient verbalized understanding of information discussed and she states that she has no further questions  at this time related to diabetes.   RNs to provide ongoing basic DM education at bedside with this patient and engage patient to actively check blood glucose and administer insulin injections.   Thanks, Barnie Alderman, RN, MSN, CDE Diabetes Coordinator Inpatient Diabetes Program [phone] (Team Pager from 8am to 5pm)

## 2021-08-28 NOTE — ED Triage Notes (Signed)
Pt says her doctor came to do a home visit this morning and says her blood sugar was 383.  Also says had ketones and glucose in her urine.  Reports has been having frequent urination recently.

## 2021-08-28 NOTE — ED Notes (Signed)
Patient Alert and oriented to baseline. Stable and ambulatory to baseline. Patient verbalized understanding of the discharge instructions.  Patient belongings were taken by the patient.   

## 2021-10-08 ENCOUNTER — Telehealth: Payer: Self-pay | Admitting: Nutrition

## 2021-10-08 ENCOUNTER — Ambulatory Visit: Payer: Medicaid Other | Admitting: Nutrition

## 2021-10-08 NOTE — Telephone Encounter (Signed)
Vm left to call and r/s missed appt. ?

## 2022-01-19 ENCOUNTER — Other Ambulatory Visit: Payer: Self-pay | Admitting: *Deleted

## 2022-01-19 MED ORDER — METRONIDAZOLE 500 MG PO TABS: 500.0000 mg | ORAL_TABLET | Freq: Two times a day (BID) | ORAL | 0 refills | Status: AC

## 2022-05-18 ENCOUNTER — Encounter (HOSPITAL_COMMUNITY): Payer: Self-pay | Admitting: *Deleted

## 2022-05-18 ENCOUNTER — Other Ambulatory Visit: Payer: Self-pay

## 2022-05-18 ENCOUNTER — Emergency Department (HOSPITAL_COMMUNITY): Payer: Medicaid Other

## 2022-05-18 ENCOUNTER — Emergency Department (HOSPITAL_COMMUNITY)
Admission: EM | Admit: 2022-05-18 | Discharge: 2022-05-18 | Disposition: A | Discharge status: Home / Self Care | Payer: Medicaid Other | Attending: Emergency Medicine | Admitting: Emergency Medicine

## 2022-05-18 DIAGNOSIS — S299XXA Unspecified injury of thorax, initial encounter: Secondary | ICD-10-CM | POA: Diagnosis present

## 2022-05-18 DIAGNOSIS — S20212A Contusion of left front wall of thorax, initial encounter: Secondary | ICD-10-CM | POA: Diagnosis not present

## 2022-05-18 DIAGNOSIS — Y92410 Unspecified street and highway as the place of occurrence of the external cause: Secondary | ICD-10-CM | POA: Diagnosis not present

## 2022-05-18 MED ORDER — METHOCARBAMOL 500 MG PO TABS: 500.0000 mg | ORAL_TABLET | Freq: Three times a day (TID) | ORAL | 0 refills | Status: AC

## 2022-05-18 MED ORDER — IBUPROFEN 800 MG PO TABS: 800.0000 mg | ORAL_TABLET | Freq: Three times a day (TID) | ORAL | 0 refills | Status: AC

## 2022-05-18 MED ORDER — METHOCARBAMOL 500 MG PO TABS
500.0000 mg | ORAL_TABLET | Freq: Once | ORAL | Status: AC
  Administered 2022-05-18: 500 mg via ORAL
  Filled 2022-05-18: qty 1

## 2022-05-18 MED ORDER — IBUPROFEN 800 MG PO TABS
800.0000 mg | ORAL_TABLET | Freq: Once | ORAL | Status: AC
  Administered 2022-05-18: 800 mg via ORAL
  Filled 2022-05-18: qty 1

## 2022-05-18 NOTE — ED Notes (Signed)
Patient transported to X-ray 

## 2022-05-18 NOTE — Discharge Instructions (Signed)
X-ray of your chest was reassuring.  I recommend that you alternate ice and heat to the affected area.  Take the medication as directed.  Take the ibuprofen with food.  Follow-up with your primary care provider for recheck or return to the emergency department for any new or worsening symptoms.
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
## 2022-05-18 NOTE — ED Notes (Signed)
Pt d/c home per MD order. Discharge summary reviewed with pt, pt verbalizes understanding. Ambulatory off unit. No s/s of acute distress noted at discharge.  °
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
